# Patient Record
Sex: Female | Born: 1980 | Race: White | Hispanic: No | Marital: Single | Smoking: Current every day smoker
Health system: Southern US, Community
[De-identification: ages and names within clinical notes are randomized; demographics above are authoritative.]

## PROBLEM LIST (undated history)

## (undated) ENCOUNTER — Inpatient Hospital Stay (HOSPITAL_COMMUNITY): Payer: Self-pay

## (undated) ENCOUNTER — Emergency Department (HOSPITAL_BASED_OUTPATIENT_CLINIC_OR_DEPARTMENT_OTHER)

## (undated) DIAGNOSIS — F32A Depression, unspecified: Secondary | ICD-10-CM

## (undated) DIAGNOSIS — Q351 Cleft hard palate: Secondary | ICD-10-CM

## (undated) DIAGNOSIS — IMO0002 Reserved for concepts with insufficient information to code with codable children: Secondary | ICD-10-CM

## (undated) DIAGNOSIS — N83209 Unspecified ovarian cyst, unspecified side: Secondary | ICD-10-CM

## (undated) DIAGNOSIS — R51 Headache: Secondary | ICD-10-CM

## (undated) DIAGNOSIS — Z9889 Other specified postprocedural states: Secondary | ICD-10-CM

## (undated) DIAGNOSIS — R87619 Unspecified abnormal cytological findings in specimens from cervix uteri: Secondary | ICD-10-CM

## (undated) DIAGNOSIS — F419 Anxiety disorder, unspecified: Secondary | ICD-10-CM

## (undated) DIAGNOSIS — G510 Bell's palsy: Secondary | ICD-10-CM

## (undated) DIAGNOSIS — R112 Nausea with vomiting, unspecified: Secondary | ICD-10-CM

## (undated) DIAGNOSIS — M797 Fibromyalgia: Secondary | ICD-10-CM

## (undated) DIAGNOSIS — F329 Major depressive disorder, single episode, unspecified: Secondary | ICD-10-CM

## (undated) HISTORY — PX: TYMPANOSTOMY TUBE PLACEMENT: SHX32

## (undated) HISTORY — PX: NO PAST SURGERIES: SHX2092

## (undated) HISTORY — PX: UPPER GASTROINTESTINAL ENDOSCOPY: SHX188

## (undated) HISTORY — PX: COSMETIC SURGERY: SHX468

## (undated) HISTORY — PX: SALPINGECTOMY: SHX328

## (undated) HISTORY — PX: LAPAROSCOPY: SHX197

## (undated) HISTORY — PX: ABDOMINAL HYSTERECTOMY: SHX81

## (undated) HISTORY — PX: ABDOMINAL SURGERY: SHX537

---
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
## 1999-06-22 ENCOUNTER — Ambulatory Visit (HOSPITAL_COMMUNITY): Admission: RE | Admit: 1999-06-22 | Discharge: 1999-06-22 | Payer: Self-pay | Admitting: Family Medicine

## 1999-06-22 ENCOUNTER — Encounter: Payer: Self-pay | Admitting: Family Medicine

## 1999-07-05 ENCOUNTER — Emergency Department (HOSPITAL_COMMUNITY): Admission: EM | Admit: 1999-07-05 | Discharge: 1999-07-05 | Payer: Self-pay | Admitting: *Deleted

## 2000-09-04 ENCOUNTER — Inpatient Hospital Stay (HOSPITAL_COMMUNITY): Admission: AD | Admit: 2000-09-04 | Discharge: 2000-09-04 | Payer: Self-pay | Admitting: Obstetrics and Gynecology

## 2000-10-06 ENCOUNTER — Encounter (INDEPENDENT_AMBULATORY_CARE_PROVIDER_SITE_OTHER): Payer: Self-pay | Admitting: Specialist

## 2000-10-06 ENCOUNTER — Other Ambulatory Visit: Admission: RE | Admit: 2000-10-06 | Discharge: 2000-10-06 | Payer: Self-pay | Admitting: Obstetrics and Gynecology

## 2000-10-06 ENCOUNTER — Inpatient Hospital Stay (HOSPITAL_COMMUNITY): Admission: AD | Admit: 2000-10-06 | Discharge: 2000-10-06 | Payer: Self-pay | Admitting: Obstetrics and Gynecology

## 2000-12-08 ENCOUNTER — Encounter: Payer: Self-pay | Admitting: Emergency Medicine

## 2000-12-08 ENCOUNTER — Emergency Department (HOSPITAL_COMMUNITY): Admission: EM | Admit: 2000-12-08 | Discharge: 2000-12-08 | Payer: Self-pay | Admitting: Emergency Medicine

## 2000-12-09 ENCOUNTER — Ambulatory Visit (HOSPITAL_COMMUNITY): Admission: RE | Admit: 2000-12-09 | Discharge: 2000-12-09 | Payer: Self-pay | Admitting: Family Medicine

## 2000-12-09 ENCOUNTER — Encounter: Payer: Self-pay | Admitting: Family Medicine

## 2000-12-25 ENCOUNTER — Emergency Department (HOSPITAL_COMMUNITY): Admission: EM | Admit: 2000-12-25 | Discharge: 2000-12-25 | Payer: Self-pay | Admitting: Emergency Medicine

## 2000-12-26 ENCOUNTER — Encounter: Payer: Self-pay | Admitting: Emergency Medicine

## 2004-09-11 ENCOUNTER — Emergency Department (HOSPITAL_COMMUNITY): Admission: EM | Admit: 2004-09-11 | Discharge: 2004-09-11 | Payer: Self-pay

## 2005-06-07 ENCOUNTER — Emergency Department (HOSPITAL_COMMUNITY): Admission: EM | Admit: 2005-06-07 | Discharge: 2005-06-07 | Payer: Self-pay | Admitting: Emergency Medicine

## 2005-06-22 ENCOUNTER — Emergency Department (HOSPITAL_COMMUNITY): Admission: EM | Admit: 2005-06-22 | Discharge: 2005-06-22 | Payer: Self-pay | Admitting: Emergency Medicine

## 2006-07-27 ENCOUNTER — Emergency Department (HOSPITAL_COMMUNITY): Admission: EM | Admit: 2006-07-27 | Discharge: 2006-07-27 | Payer: Self-pay | Admitting: Emergency Medicine

## 2007-08-15 ENCOUNTER — Emergency Department (HOSPITAL_COMMUNITY): Admission: EM | Admit: 2007-08-15 | Discharge: 2007-08-15 | Payer: Self-pay | Admitting: Emergency Medicine

## 2007-09-24 ENCOUNTER — Emergency Department (HOSPITAL_COMMUNITY): Admission: EM | Admit: 2007-09-24 | Discharge: 2007-09-24 | Payer: Self-pay | Admitting: Emergency Medicine

## 2008-03-04 ENCOUNTER — Emergency Department (HOSPITAL_COMMUNITY): Admission: EM | Admit: 2008-03-04 | Discharge: 2008-03-04 | Payer: Self-pay | Admitting: Emergency Medicine

## 2008-04-25 ENCOUNTER — Emergency Department (HOSPITAL_COMMUNITY): Admission: EM | Admit: 2008-04-25 | Discharge: 2008-04-26 | Payer: Self-pay | Admitting: Emergency Medicine

## 2008-05-02 ENCOUNTER — Ambulatory Visit (HOSPITAL_COMMUNITY): Admission: RE | Admit: 2008-05-02 | Discharge: 2008-05-02 | Payer: Self-pay | Admitting: Obstetrics and Gynecology

## 2008-07-12 ENCOUNTER — Inpatient Hospital Stay (HOSPITAL_COMMUNITY): Admission: EM | Admit: 2008-07-12 | Discharge: 2008-07-17 | Payer: Self-pay | Admitting: Emergency Medicine

## 2008-07-15 ENCOUNTER — Encounter (INDEPENDENT_AMBULATORY_CARE_PROVIDER_SITE_OTHER): Payer: Self-pay | Admitting: Internal Medicine

## 2008-08-29 DIAGNOSIS — N179 Acute kidney failure, unspecified: Secondary | ICD-10-CM

## 2008-08-30 ENCOUNTER — Ambulatory Visit: Payer: Self-pay | Admitting: Gastroenterology

## 2008-08-30 DIAGNOSIS — R51 Headache: Secondary | ICD-10-CM

## 2008-08-30 DIAGNOSIS — R109 Unspecified abdominal pain: Secondary | ICD-10-CM | POA: Insufficient documentation

## 2008-08-30 DIAGNOSIS — R110 Nausea: Secondary | ICD-10-CM

## 2008-09-04 ENCOUNTER — Ambulatory Visit: Payer: Self-pay | Admitting: Cardiology

## 2008-09-24 ENCOUNTER — Ambulatory Visit: Payer: Self-pay | Admitting: Gastroenterology

## 2008-09-25 ENCOUNTER — Encounter: Payer: Self-pay | Admitting: Gastroenterology

## 2008-09-30 ENCOUNTER — Ambulatory Visit: Payer: Self-pay | Admitting: Gastroenterology

## 2008-09-30 LAB — CONVERTED CEMR LAB
ALT: 10 units/L (ref 0–35)
Albumin: 4.3 g/dL (ref 3.5–5.2)
Alkaline Phosphatase: 77 units/L (ref 39–117)
BUN: 16 mg/dL (ref 6–23)
Basophils Absolute: 0 10*3/uL (ref 0.0–0.1)
CO2: 29 meq/L (ref 19–32)
Chloride: 102 meq/L (ref 96–112)
GFR calc Af Amer: 128 mL/min
HCT: 43.7 % (ref 36.0–46.0)
Lymphocytes Relative: 30.3 % (ref 12.0–46.0)
MCHC: 35.1 g/dL (ref 30.0–36.0)
Neutro Abs: 6.5 10*3/uL (ref 1.4–7.7)
Platelets: 211 10*3/uL (ref 150–400)
RBC: 4.53 M/uL (ref 3.87–5.11)
RDW: 11.8 % (ref 11.5–14.6)
Sed Rate: 6 mm/hr (ref 0–22)
Sodium: 138 meq/L (ref 135–145)
Tissue Transglutaminase Ab, IgA: 0.4 units (ref <7)
Total Protein: 7.1 g/dL (ref 6.0–8.3)
WBC: 10.8 10*3/uL — ABNORMAL HIGH (ref 4.5–10.5)

## 2008-10-01 ENCOUNTER — Telehealth: Payer: Self-pay | Admitting: Gastroenterology

## 2008-10-02 ENCOUNTER — Ambulatory Visit: Payer: Self-pay | Admitting: Cardiology

## 2008-11-12 ENCOUNTER — Emergency Department (HOSPITAL_COMMUNITY): Admission: EM | Admit: 2008-11-12 | Discharge: 2008-11-13 | Payer: Self-pay | Admitting: Emergency Medicine

## 2009-01-08 ENCOUNTER — Emergency Department: Payer: Self-pay | Admitting: Emergency Medicine

## 2009-04-10 ENCOUNTER — Emergency Department: Payer: Self-pay | Admitting: Emergency Medicine

## 2009-04-24 ENCOUNTER — Ambulatory Visit (HOSPITAL_COMMUNITY): Admission: RE | Admit: 2009-04-24 | Discharge: 2009-04-24 | Payer: Self-pay | Admitting: Obstetrics and Gynecology

## 2009-04-24 ENCOUNTER — Ambulatory Visit: Payer: Self-pay | Admitting: Vascular Surgery

## 2009-04-24 ENCOUNTER — Encounter (INDEPENDENT_AMBULATORY_CARE_PROVIDER_SITE_OTHER): Payer: Self-pay | Admitting: Obstetrics and Gynecology

## 2009-05-25 ENCOUNTER — Inpatient Hospital Stay (HOSPITAL_COMMUNITY): Admission: AD | Admit: 2009-05-25 | Discharge: 2009-05-25 | Payer: Self-pay | Admitting: Obstetrics and Gynecology

## 2009-07-16 ENCOUNTER — Inpatient Hospital Stay (HOSPITAL_COMMUNITY): Admission: AD | Admit: 2009-07-16 | Discharge: 2009-07-16 | Payer: Self-pay | Admitting: Obstetrics and Gynecology

## 2009-08-19 ENCOUNTER — Inpatient Hospital Stay (HOSPITAL_COMMUNITY): Admission: AD | Admit: 2009-08-19 | Discharge: 2009-08-20 | Payer: Self-pay | Admitting: Obstetrics and Gynecology

## 2009-08-27 ENCOUNTER — Inpatient Hospital Stay (HOSPITAL_COMMUNITY): Admission: AD | Admit: 2009-08-27 | Discharge: 2009-08-27 | Payer: Self-pay | Admitting: Obstetrics and Gynecology

## 2009-09-02 ENCOUNTER — Inpatient Hospital Stay (HOSPITAL_COMMUNITY): Admission: AD | Admit: 2009-09-02 | Discharge: 2009-09-02 | Payer: Self-pay | Admitting: Obstetrics and Gynecology

## 2009-09-05 ENCOUNTER — Ambulatory Visit: Payer: Self-pay | Admitting: Advanced Practice Midwife

## 2009-09-05 ENCOUNTER — Inpatient Hospital Stay (HOSPITAL_COMMUNITY): Admission: AD | Admit: 2009-09-05 | Discharge: 2009-09-05 | Payer: Self-pay | Admitting: Obstetrics and Gynecology

## 2009-09-14 ENCOUNTER — Inpatient Hospital Stay (HOSPITAL_COMMUNITY): Admission: AD | Admit: 2009-09-14 | Discharge: 2009-09-14 | Payer: Self-pay | Admitting: Obstetrics and Gynecology

## 2009-09-16 ENCOUNTER — Inpatient Hospital Stay (HOSPITAL_COMMUNITY): Admission: AD | Admit: 2009-09-16 | Discharge: 2009-09-18 | Payer: Self-pay | Admitting: Obstetrics and Gynecology

## 2010-05-02 ENCOUNTER — Emergency Department (HOSPITAL_COMMUNITY): Admission: EM | Admit: 2010-05-02 | Discharge: 2010-05-02 | Payer: Self-pay | Admitting: Family Medicine

## 2010-06-07 ENCOUNTER — Emergency Department: Payer: Self-pay | Admitting: Emergency Medicine

## 2010-09-02 ENCOUNTER — Emergency Department (HOSPITAL_COMMUNITY)
Admission: EM | Admit: 2010-09-02 | Discharge: 2010-09-02 | Payer: Self-pay | Source: Home / Self Care | Admitting: Family Medicine

## 2010-10-10 ENCOUNTER — Emergency Department (HOSPITAL_COMMUNITY)
Admission: EM | Admit: 2010-10-10 | Discharge: 2010-10-10 | Payer: Self-pay | Source: Home / Self Care | Admitting: Emergency Medicine

## 2010-10-13 LAB — GC/CHLAMYDIA PROBE AMP, GENITAL
Chlamydia, DNA Probe: NEGATIVE
GC Probe Amp, Genital: NEGATIVE

## 2010-10-13 LAB — CBC
Hemoglobin: 15.5 g/dL — ABNORMAL HIGH (ref 12.0–15.0)
MCH: 32.7 pg (ref 26.0–34.0)
MCV: 94.1 fL (ref 78.0–100.0)
Platelets: 262 10*3/uL (ref 150–400)
RDW: 12.4 % (ref 11.5–15.5)

## 2010-10-13 LAB — URINALYSIS, ROUTINE W REFLEX MICROSCOPIC
Bilirubin Urine: NEGATIVE
Ketones, ur: NEGATIVE mg/dL
Nitrite: NEGATIVE
Protein, ur: NEGATIVE mg/dL
Urobilinogen, UA: 0.2 mg/dL (ref 0.0–1.0)

## 2010-10-13 LAB — BASIC METABOLIC PANEL
BUN: 15 mg/dL (ref 6–23)
GFR calc Af Amer: >60 mL/min (ref >60)
Potassium: 4.6 mEq/L (ref 3.5–5.1)

## 2010-10-13 LAB — WET PREP, GENITAL: Clue Cells Wet Prep HPF POC: NONE SEEN

## 2010-12-21 LAB — CBC
HCT: 37.1 % (ref 36.0–46.0)
Hemoglobin: 14.8 g/dL (ref 12.0–15.0)
MCHC: 34.2 g/dL (ref 30.0–36.0)
MCV: 98.2 fL (ref 78.0–100.0)
MCV: 99.3 fL (ref 78.0–100.0)
Platelets: 169 10*3/uL (ref 150–400)
RDW: 12.6 % (ref 11.5–15.5)
RDW: 12.9 % (ref 11.5–15.5)
WBC: 14.5 10*3/uL — ABNORMAL HIGH (ref 4.0–10.5)

## 2010-12-22 LAB — URINALYSIS, ROUTINE W REFLEX MICROSCOPIC
Bilirubin Urine: NEGATIVE
Ketones, ur: NEGATIVE mg/dL
Nitrite: NEGATIVE
Nitrite: NEGATIVE
Protein, ur: NEGATIVE mg/dL
Protein, ur: NEGATIVE mg/dL
Specific Gravity, Urine: <1.005 — ABNORMAL LOW (ref 1.005–1.030)
Urobilinogen, UA: 0.2 mg/dL (ref 0.0–1.0)
Urobilinogen, UA: 0.2 mg/dL (ref 0.0–1.0)
pH: 6.5 (ref 5.0–8.0)

## 2010-12-22 LAB — URINE MICROSCOPIC-ADD ON

## 2010-12-22 LAB — URINE CULTURE: Colony Count: NO GROWTH

## 2010-12-23 LAB — URINALYSIS, ROUTINE W REFLEX MICROSCOPIC
Glucose, UA: NEGATIVE mg/dL
Hgb urine dipstick: NEGATIVE
Ketones, ur: NEGATIVE mg/dL
Protein, ur: NEGATIVE mg/dL
pH: 7 (ref 5.0–8.0)

## 2010-12-23 LAB — URINE CULTURE: Colony Count: NO GROWTH

## 2010-12-23 LAB — FETAL FIBRONECTIN: Fetal Fibronectin: NEGATIVE

## 2010-12-23 LAB — URINE MICROSCOPIC-ADD ON: RBC / HPF: NONE SEEN RBC/hpf (ref <3)

## 2010-12-24 LAB — URINE CULTURE: Colony Count: 6000

## 2010-12-24 LAB — URINALYSIS, ROUTINE W REFLEX MICROSCOPIC
Bilirubin Urine: NEGATIVE
Ketones, ur: NEGATIVE mg/dL
Nitrite: NEGATIVE
Urobilinogen, UA: 0.2 mg/dL (ref 0.0–1.0)

## 2010-12-24 LAB — WET PREP, GENITAL: Trich, Wet Prep: NONE SEEN

## 2010-12-24 LAB — FETAL FIBRONECTIN: Fetal Fibronectin: NEGATIVE

## 2010-12-25 LAB — URINALYSIS, ROUTINE W REFLEX MICROSCOPIC
Bilirubin Urine: NEGATIVE
Ketones, ur: NEGATIVE mg/dL
Nitrite: NEGATIVE
Protein, ur: NEGATIVE mg/dL
Urobilinogen, UA: 0.2 mg/dL (ref 0.0–1.0)

## 2010-12-25 LAB — URINE MICROSCOPIC-ADD ON

## 2011-01-05 LAB — URINALYSIS, ROUTINE W REFLEX MICROSCOPIC
Glucose, UA: NEGATIVE mg/dL
Leukocytes, UA: NEGATIVE
Protein, ur: NEGATIVE mg/dL
Specific Gravity, Urine: 1.025 (ref 1.005–1.030)

## 2011-01-05 LAB — DIFFERENTIAL
Eosinophils Absolute: 0.4 10*3/uL (ref 0.0–0.7)
Eosinophils Relative: 3 % (ref 0–5)
Lymphs Abs: 3.7 10*3/uL (ref 0.7–4.0)
Monocytes Relative: 6 % (ref 3–12)
Neutrophils Relative %: 62 % (ref 43–77)

## 2011-01-05 LAB — CBC
HCT: 43.1 % (ref 36.0–46.0)
MCV: 95.1 fL (ref 78.0–100.0)
RBC: 4.53 MIL/uL (ref 3.87–5.11)
WBC: 13 10*3/uL — ABNORMAL HIGH (ref 4.0–10.5)

## 2011-01-05 LAB — BASIC METABOLIC PANEL
CO2: 28 mEq/L (ref 19–32)
Calcium: 9.6 mg/dL (ref 8.4–10.5)
Creatinine, Ser: 0.77 mg/dL (ref 0.4–1.2)
GFR calc Af Amer: >60 mL/min (ref >60)
Glucose, Bld: 88 mg/dL (ref 70–99)

## 2011-01-05 LAB — URINE MICROSCOPIC-ADD ON

## 2011-01-25 ENCOUNTER — Emergency Department (HOSPITAL_COMMUNITY): Admission: EM | Admit: 2011-01-25 | Payer: Self-pay | Source: Home / Self Care

## 2011-02-02 NOTE — Consult Note (Signed)
[name], COFFIE NO.:  [id number]   MEDICAL RECORD NO.:  [id number]          PATIENT TYPE:  INP   LOCATION:  1332                         FACILITY:  [facility]   PHYSICIAN:  Dineen Kid. Rana Snare, M.D.    DATE OF BIRTH:  Jul 01, 1981   DATE OF CONSULTATION:  07/12/2008  DATE OF DISCHARGE:                                 CONSULTATION   HISTORY OF PRESENT ILLNESS:  [name] is a 30 year old white female  well-known to me with a history of chronic pelvic pain.  She was last  seen by me in August when she was having some mild right lower quadrant  discomfort with normal GYN evaluation other than she did have a CAT scan  at that time, which showed a mild dilation of the right fallopian tube.  Previously, she had surgery in June for the left lower quadrant pain, at  which time she did have some small adhesions along the left tubo-ovarian  complex but had a normal-appearing right fallopian tube and ovary.  Uterus was with no evidence of endometriosis at that time and normal-  appearing appendix.  She recovered nicely from the surgery and was not  having problems until the end of the summer other than she did have a  mildly increased white count around the time of the surgery but had a  normal white count in August.   In September, she apparently went to [facility] for facial surgery for  history of cleft lip and palate and apparently did well with the surgery  but subsequently ended up having a cellulitis and had to return,  requiring antibiotics.  She returns with abdominal pain, nausea and  vomiting, and presented to the emergency room last night, where she was  found to have elevated creatinine and suspicions for acute renal  failure.  Has been admitted to the medicine team for evaluation and  treatment of this.  She apparently was on vancomycin and Zosyn but  apparently they did not have a culture back at [facility].   Mother reports that she has had off-and-on nausea  before the surgery  unrelated to pain.  This actually has gotten worse recently.   PHYSICAL EXAMINATION:  She is afebrile.  GENERAL:  She is in mild-to-moderate distress upon examination due to  the nausea.  The pain appears to be controlled unless deep palpation to  the abdomen.  HEART:  Regular rate and rhythm.  LUNGS:  Clear to auscultation bilaterally.  ABDOMEN:  Soft.  She has normoactive bowel sounds.  No organomegaly is  palpable.  She does have generalized abdominal tenderness with the  greatest predilection in the right lower quadrant.  There is no rebound.  There is some mild guarding to deep palpation.  The pain also is along  the right flank.  PELVIC:  Deferred per patient's request.  Apparently, last night the ER  doctor did a pelvic exam.  She said the pelvic part did not hurt, but  the abdominal hand did.   I reviewed her laboratory evaluations today, which showed a negative  urinalysis, negative cultures,  negative pregnancy test.  Her initial  white count was 19.8.  Her white count this morning is 15.6.   CT of the abdomen and pelvis shows no acute abnormality of the abdomen.  The pelvis shows a tubal structure in the right adnexal area, which was  seen in August, but is more prominent today.  A cystic lesion of the  right ovary, which measured 3.6 cm in size.   Renal ultrasound today shows nonspecific renal medical disease.   IMPRESSION/PLAN:  1. Acute renal failure:  Recommend she continue to be followed by the      internal medicine team at [facility], as they currently are.  2. Nausea, vomiting, and diarrhea.  This could either be caused by or      related to problem #1.  Also, because of the antibiotics,      especially the strength of the antibiotics and the prolonged      course, this could also be related to Clostridium difficile, and I      do agree with the workup for evaluation and treatment of this.      This could be the cause of the abdominal  discomfort as well.  3. Gynecological abnormalities and a history of chronic pain:      Comparing the CT scans, there is not a substantial difference      between the two, the one from today and the one from two months      ago, especially with the amount of discomfort that she is showing,      certainly this would not cause any renal failure.   At this time, I would recommend that you continue with the aggressive  management of the renal failure and other causes of the diarrhea and  nausea and vomiting.  If you feel like this becomes more gynecologic in  nature, would recommend a pelvic ultrasound for evaluation of the adnexa  and pelvis.  If there are suspicions for purulence or certainly if the  right ovarian cyst represents anything other than a normal functional  cyst, another consideration would be fluoroscopic drainage or culture of  that.   Finally, if necessary, I could proceed laparoscopically for evaluation  of the right tubal structure; however, I feel like this would only be a  last resort and do not really think it would be very high yields.  I  have operated on her four months ago and had very normal findings at  that time.   Please contact me for further GYN needs or questions.      Dineen Kid Rana Snare, M.D.  Electronically Signed     DCL/MEDQ  D:  07/12/2008  T:  07/13/2008  Job:  [id number]

## 2011-02-02 NOTE — Discharge Summary (Signed)
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  Presented to the [facility] ER because patient was having  intractable nausea and vomiting with right lower quadrant pain and  diarrhea.  On admission, the patient had a CAT scan of the abdomen and  pelvis which revealed a tubular structure involving the right adnexa.  This was seen on the previous CT scan but is more prominent today.  There was associated free fluid, which is worrisome for hydrosalpinx or  potentially myosalpinx.  She has a cystic lesion of the right ovary.  No  acute abnormalities of the abdomen.   Chest x-ray revealed chronic bronchitic changes.  No acute infiltrate.   In addition, patient was found to have a creatinine of 4.17 with no  previous history of renal failure.  Patient was started on IV fluids  with aggressive hydration.  Her vancomycin and Zosyn were discontinued,  suspecting myosalpinx.  Empiric antibiotics with ceftriaxone and Flagyl  was started.  OB/GYN consult was obtained with Dr. Rana Snare, who is well  known to her.   Dr. Rana Snare felt that the patient may be having other causes for her  diarrhea, nausea and vomiting and recommended a pelvic ultrasound for  evaluation from the adnexa and pelvis and to treat her acute renal  failure.  Schedule patient for pyosalpinx, fluoroscopic-guided drainage  of the culture of that fluid was recommended.   A pelvic sonogram was done which showed  this large, simple-appearing  right ovarian cyst, amenable to followup in 4-6 weeks.   Patient also had blood cultures, urine cultures, and C. diff, all of  which were negative.  Nephrology was consulted.  Sonogram of the kidneys  were done, which did not show any obstruction or  features compatible  with nonspecific renal medical disease.  Patient had nonoliguric renal  failure.  Her chart received from [facility] showed she had a  creatinine of 0.8 on July 08, 2008.  Per nephrology, it was felt that  the patient had acute renal failure, probably secondary to vancomycin  toxicity, and felt that her creatinine would take more time to  normalize.  At this time, as the patient has not had any features of  tubular or renal abscess on the pelvic ultrasound, antibiotics were  discontinued.   Patient has shown improvement in nausea and vomiting.  Is able to  tolerate a diet at this time.  Nephrologist has advised that the patient  will need twice-weekly check of basic metabolic panel.  This also has to  be  faxed to the nephrologist at [phone].  I did discuss with Dr. Demaris Callander, the surgeon at [facility], who said he felt patient may have  had some cellulitis of the right side of the face, which is why the  antibiotics were started.  Once the patient is discharged, he is going  to see the patient on Thursday, July 18, 2008.  Once the arrangements  are being made for blood withdrawal, patient will be discharged home  with p.r.n. Phenergan.   PROCEDURES DURING THIS STAY:  1. CT of the abdomen and pelvis shows tubular structure involving the      right adnexa.  This was seen on the previous CT scan, but it is      more prominent today and is associated with free fluid, worrisome      for hydrosalpinx or potentially myosalpinx.  Cystic lesion of the      right ovary.  It is not clear if this is actually in the right      ovary or associated with other enlarged tubular  structure, again      tubo-ovarian abscess is of concern.  2. Sonogram of the kidneys done on July 12, 2008 shows findings      compatible with nonspecific renal medical disease.  3. Pelvic ultrasound showed a large simple-appearing right ovarian      cyst, amenable to followup in 4-6 weeks.   FINAL DIAGNOSES:  1. Acute renal failure, probably secondary to vancomycin toxicity.  2. Intractable nausea and vomiting, etiology not clear.  3. History of recent right-sided facial reconstructive surgery with      possible cellulitis.  4. Ovarian cyst, right-sided.   PLAN:  Once patient's lab drawing of basic metabolic panel twice-a-week  has been arranged and copies, which are to be faxed to nephrology at [phone], has been arranged.  If patient still has no nausea, vomiting,  and is able to tolerate a diet, will be discharged home to follow up  with her primary care physician.  Patient is to follow with a  nephrologist as scheduled.  Patient is to follow up with Dr. Demaris Callander,  her surgeon at [facility], on Thursday, July 18, 2008.  Patient  can be on a regular diet.   DISCHARGE MEDICATIONS:  Dictated by the discharging MD at the time of  the discharge.      Eduard Clos, MD  Electronically Signed     ANK/MEDQ  D:  07/16/2008  T:  07/16/2008  Job:  [id number]

## 2011-02-02 NOTE — Discharge Summary (Signed)
NAMELIN, HACKMANN NO.:  [id number]   MEDICAL RECORD NO.:  [id number]          PATIENT TYPE:  INP   LOCATION:  1332                         FACILITY[facility]  [facility]   PHYSICIAN:  Altha Harm, MDDATE OF BIRTH:  [birth year]   DATE OF ADMISSION:  07/11/2008  DATE OF DISCHARGE:  07/17/2008                               DISCHARGE SUMMARY   DISCHARGE DISPOSITION:  Home.   FINAL DISCHARGE DIAGNOSES:  Please refer to discharge summary done by  Dr. Toniann Fail on July 16, 2008, for details of the discharge  diagnoses.   DISCHARGE MEDICATIONS:  1. Xanax 1 mg p.o. b.i.d.  2. Phenergan 12.5 mg p.o. q.8 h p.r.n.   FOLLOWUP:  The patient is to follow up with Dr. Eliott Nine in the nephrology  office.  The patient is to have her lab results done twice a week on  Monday and Thursday, first set of lab results being done on July 22, 2008.  The number has been given for the patient to call Dr. Eliott Nine to  arrange the appointment.  The patient is also to follow up with her  primary care physicians at [facility] as needed.   The PICC line is being removed from the patient as there is no infusion  going to the PICC line.  The patient states that she is difficult stick  for blood draws and I advised the patient that she could have EMLA used  as a topical analgesic prior to having blood draws.   PERTINENT LABORATORY STUDIES:  At the time of discharge the patient had  a sodium of 143, potassium 3.7, chloride 113, bicarb 24, BUN 19,  creatinine 3.67.      Altha Harm, MD  Electronically Signed     MAM/MEDQ  D:  07/17/2008  T:  07/17/2008  Job:  [id number]

## 2011-02-02 NOTE — H&P (Signed)
Pamela Bailey, Pamela Bailey NO.:  [id number]   MEDICAL RECORD NO.:  [id number]          PATIENT TYPE:  INP   LOCATION:  0103                         FACILITY:  [facility]   PHYSICIAN:  Thomasenia Bottoms, MDDATE OF BIRTH:  May 26, 1981   DATE OF ADMISSION:  07/11/2008  DATE OF DISCHARGE:                              HISTORY & PHYSICAL   CHIEF COMPLAINT:  Abdominal pain.   HISTORY OF PRESENT ILLNESS:  Pamela Bailey is a 30 year old who presents  with right lower quadrant abdominal pain, nausea, vomiting and diarrhea.  The patient had facial surgery last month as part of a series of  surgeries to repair a clef lip and palate.  This was complicated by an  infection of the face.  She was discharged charge from [facility]  approximately 7 days ago on IV vancomycin and Zosyn after a 5-day  hospital stay.  She says she felt fine at the time of discharge, but she  started to have abdominal pains again.  She has had trouble with  abdominal pains intermittently for at least 6 months, likely slightly  longer, but she also started having trouble with nausea, vomiting and  diarrhea which was new for her.   PAST MEDICAL HISTORY:  Significant for multiple surgeries to repair a  cleft lip and palate with the most recent being in September 2009.  This  one has been complicated by infection as mentioned above.  She has had  trouble with this abdominal pain in the past and actually had what  sounds like a laparoscopic surgery by her OB doctor in May 2009, looking  for the cause.  The significant other tells me that there was some  concern of cancer, but none was found.  The only thing that was found  was essentially some scar tissue.  Based on a CT scan in August 2009,  the patient had what appeared to be a possible right hydrosalpinx.   MEDICATIONS ON ARRIVAL:  1. Vancomycin IV twice daily.  2. Zosyn 3.375 mg four times daily.  3. P.r.n. Phenergan.  4. Ammonium.  5. The patient also  was on Chantix, which she took her last dose of on      Friday when she was in the hospital at [facility].   FAMILY HISTORY:  Significant for no coronary artery disease.   SOCIAL HISTORY:  She does smoke cigarettes.  No alcohol or illicit drug  use.   REVIEW OF SYSTEMS:  CONSTITUTIONAL:  Her appetite was okay prior to this  trouble as mentioned above.  No night sweats.  HEENT:  No headache.  She had some facial pain because of her recent  surgery.  At baseline, she does not have movement in the right side of  her face, but she does have feeling.  CARDIOVASCULAR:  Generally no chest pains.  No lower extremity edema.  RESPIRATORY:  No shortness of breath.  GI:  She has this abdominal pain, but did not have diarrhea, nausea or  vomiting and had not seen any bright red blood per rectum or vomiting  blood.  All other systems  reviewed and are negative.   PHYSICAL EXAMINATION:  VITAL SIGNS:  In the emergency department, her  temperature was 98.6, blood pressure 126/71, pulse initially 103,  respiratory rate 20.  O2 sats 100% on room air.  GENERAL:  The patient is slightly ill-appearing, no acute distress.  HEENT:  Normocephalic, atraumatic.  Her pupils are equal and round.  Her  sclerae are nonicteric.  Oral mucosa slightly dry.  She has mild cleft  lip deformity.  Her right face seems mildly swollen, but is not  discolored, is not hot to touch.  There is no erythema.  NECK:  Supple.  No lymphadenopathy, no thyromegaly, no jugulovenous  distention.  CARDIAC:  Regular rate and rhythm.  No murmurs, no gallops, no rubs.  LUNGS:  Clear to auscultation bilaterally.  No wheezes, no rhonchi, no  rales.  ABDOMEN:  Soft.  She does have bowel sounds.  No hepatosplenomegaly, but  she does have some tenderness in the right lower quadrant.  EXTREMITIES:  Reveal no evidence of clubbing, cyanosis or pitting edema.  NEUROLOGICAL:  She is alert and oriented x3.  No slurred speech.  She  moves each of her  extremities with no evidence of focal neurologic  deficit.  She has a normal gait.  Sensory exam grossly is intact in her  face and in her extremities.  She does not have movement of the right  side of her face.  MUSCULOSKELETAL:  Reveals good range of motion.  No  effusions of her joints.   LABORATORY DATA:  Urine pregnancy negative.  Urinalysis unremarkable.  She has 0-2 WBCs, 0-2 RBCs.  Her white count is 19.8, hemoglobin 11.8,  hematocrit 34.4, platelet count is 217.  Sodium is 139, potassium 3.8,  chloride 101, bicarb 28, glucose 101, BUN 22, creatinine 4.17.  The  patient had a CT scan of her abdomen and pelvis which revealed a tubular  structure involving the right adnexa.  This was seen on prior CT scan,  but is more prominent today.  There is associated free fluid which is  worrisome for hydrosalpinx or potentially pyosalpinx.  She has a cystic  lesion in the right ovary.  No acute abnormalities of the abdomen.  She  also had a chest x-ray which revealed some chronic bronchitic changes  and hyperaeration, no acute infiltrate.   ASSESSMENT AND PLAN:  1. Acute renal failure.  We will put the patient on IV fluids.  The      cause of the renal failure is not completely known.  There is a      chance that this is multifactorial.  The antibiotics certainly      could have contributed.  Her dehydration also has likely      contributed.  We will hold her antibiotics and hold all nephrotoxic      agents.  We will put her on IV fluids and follow.  Certainly if she      does not have a quick improvement in her creatinine, would consult      nephrology.  2. Nausea and vomiting.  I suspect this is from the renal failure.  3. Diarrhea.  Because she has been on significant strong antibiotics,      will check her stool for C diff and hold any further Imodium at      this time.  4. Facial cellulitis, post recent surgery.  The patient has been on IV      antibiotics for what sounds like  12  days now.  So at this point, I      am going to hold her antibiotics because of the kidney function and      follow.  She does not have any clinical evidence of infection that      I can detect at this moment.  5. Abdominal pain.  This is an acute on chronic problem.  The CT scan      raises the question of possible pyosalpinx, so we certainly will      consult her gynecologist, Dr. Rana Snare and ask for recommendations in      this department.      Thomasenia Bottoms, MD  Electronically Signed     CVC/MEDQ  D:  07/12/2008  T:  07/12/2008  Job:  [id number]   cc:   Demaris Callander, MD  [facility] C. Rana Snare, M.D.  Fax: [phone]

## 2011-04-01 ENCOUNTER — Emergency Department (HOSPITAL_COMMUNITY)
Admission: EM | Admit: 2011-04-01 | Discharge: 2011-04-01 | Disposition: A | Discharge status: Home / Self Care | Payer: Medicaid Other | Attending: Emergency Medicine | Admitting: Emergency Medicine

## 2011-04-01 ENCOUNTER — Emergency Department (HOSPITAL_COMMUNITY): Payer: Medicaid Other

## 2011-04-01 DIAGNOSIS — M546 Pain in thoracic spine: Secondary | ICD-10-CM | POA: Insufficient documentation

## 2011-04-01 DIAGNOSIS — J9383 Other pneumothorax: Secondary | ICD-10-CM | POA: Insufficient documentation

## 2011-04-01 DIAGNOSIS — J45909 Unspecified asthma, uncomplicated: Secondary | ICD-10-CM | POA: Insufficient documentation

## 2011-04-01 LAB — DIFFERENTIAL
Basophils Absolute: 0 10*3/uL (ref 0.0–0.1)
Basophils Relative: 0 % (ref 0–1)
Eosinophils Absolute: 0.1 10*3/uL (ref 0.0–0.7)
Eosinophils Relative: 1 % (ref 0–5)
Lymphocytes Relative: 22 % (ref 12–46)
Lymphs Abs: 3.3 10*3/uL (ref 0.7–4.0)
Monocytes Absolute: 0.7 10*3/uL (ref 0.1–1.0)
Monocytes Relative: 5 % (ref 3–12)
Neutro Abs: 10.5 10*3/uL — ABNORMAL HIGH (ref 1.7–7.7)
Neutrophils Relative %: 72 % (ref 43–77)

## 2011-04-01 LAB — URINALYSIS, ROUTINE W REFLEX MICROSCOPIC
Glucose, UA: NEGATIVE mg/dL
Ketones, ur: 15 mg/dL — AB
Leukocytes, UA: NEGATIVE
pH: 6 (ref 5.0–8.0)

## 2011-04-01 LAB — POCT I-STAT, CHEM 8
BUN: 20 mg/dL (ref 6–23)
Calcium, Ion: 1.24 mmol/L (ref 1.12–1.32)
Chloride: 102 mEq/L (ref 96–112)
Potassium: 4.4 mEq/L (ref 3.5–5.1)

## 2011-04-01 LAB — CBC
HCT: 43.4 % (ref 36.0–46.0)
Hemoglobin: 15.7 g/dL — ABNORMAL HIGH (ref 12.0–15.0)
MCH: 33.1 pg (ref 26.0–34.0)
MCHC: 36.2 g/dL — ABNORMAL HIGH (ref 30.0–36.0)
MCV: 91.6 fL (ref 78.0–100.0)
Platelets: 236 10*3/uL (ref 150–400)
RBC: 4.74 MIL/uL (ref 3.87–5.11)
RDW: 12 % (ref 11.5–15.5)
WBC: 14.6 10*3/uL — ABNORMAL HIGH (ref 4.0–10.5)

## 2011-04-01 LAB — COMPREHENSIVE METABOLIC PANEL
ALT: 15 U/L (ref 0–35)
AST: 16 U/L (ref 0–37)
Albumin: 4.5 g/dL (ref 3.5–5.2)
Alkaline Phosphatase: 82 U/L (ref 39–117)
BUN: 18 mg/dL (ref 6–23)
CO2: 27 mEq/L (ref 19–32)
Calcium: 9.9 mg/dL (ref 8.4–10.5)
Chloride: 99 mEq/L (ref 96–112)
Creatinine, Ser: 0.65 mg/dL (ref 0.50–1.10)
GFR calc Af Amer: >60 mL/min (ref >60)
GFR calc non Af Amer: >60 mL/min (ref >60)
Glucose, Bld: 93 mg/dL (ref 70–99)
Potassium: 4 mEq/L (ref 3.5–5.1)
Sodium: 138 mEq/L (ref 135–145)
Total Bilirubin: 0.4 mg/dL (ref 0.3–1.2)
Total Protein: 7.3 g/dL (ref 6.0–8.3)

## 2011-04-06 ENCOUNTER — Other Ambulatory Visit: Payer: Self-pay | Admitting: Thoracic Surgery

## 2011-04-06 DIAGNOSIS — J930 Spontaneous tension pneumothorax: Secondary | ICD-10-CM

## 2011-04-07 ENCOUNTER — Ambulatory Visit
Admission: RE | Admit: 2011-04-07 | Discharge: 2011-04-07 | Disposition: A | Discharge status: Home / Self Care | Payer: Medicaid Other | Source: Ambulatory Visit | Attending: Thoracic Surgery | Admitting: Thoracic Surgery

## 2011-04-07 ENCOUNTER — Ambulatory Visit (INDEPENDENT_AMBULATORY_CARE_PROVIDER_SITE_OTHER): Payer: Self-pay | Admitting: Thoracic Surgery

## 2011-04-07 DIAGNOSIS — J930 Spontaneous tension pneumothorax: Secondary | ICD-10-CM

## 2011-04-07 NOTE — Letter (Signed)
April 07, 2011  Dr. Burnett Kanaris  Re:  Pamela Bailey, Pamela Bailey              DOB:  July 11, 1981  Dear Dr. Piedad Climes:  The patient was referred from the [facility] Emergency Room.  She was seen there approximately 4 days ago with right chest pain.  A chest x-ray at first, had a possible 5% pneumothorax, but a followup film did not show that.  She is a smoker.  She was referred to [place] for followup for possible pneumothorax.  Chest x-ray today showed hyperinflation of her lungs with no evidence of any pneumothorax.  She has had still some mild right chest pain over the anterior axillary line at the fifth intercostal space since then, were thus resolving.  She has also had a cough since then.  She is on no medications.  She is allergic to AMOXICILLIN, AUGMENTIN, VANCOMYCIN, and ZITHROMAX.  PAST MEDICAL HISTORY:  Apparently, she had some problems with renal dysfunction in the past.  FAMILY HISTORY:  Noncontributory.  SOCIAL HISTORY:  Single.  She smokes 1-pack a day.  Does not drink alcohol on a regular basis.  REVIEW OF SYSTEMS:  She is 90 pounds.  She is 5 feet 5 inches. GENERAL:  Her weight has been stable. CARDIAC:  She has some chest tightness.  No atrial fibrillation. PULMONARY:  She has got asthma.  No hemoptysis. GI:  Some occasional abdominal pain.  No diarrhea or constipation. GU:  See past medical history. VASCULAR:  No claudication, DVT, TIAs. NEUROLOGICAL:  No dizziness, headaches, blackouts, or seizures. MUSCULOSKELETAL:  No arthritis. PSYCHIATRIC:  No depression or nervous. EYES AND ENT:  No change in eyesight or hearing. HEMATOLOGICAL:  No problems with bleeding, clotting disorders, or anemia.  PHYSICAL EXAMINATION:  She is a well developed, is a thin Caucasian female in no acute distress.  Her blood pressure is 126/92, pulse 88, respirations 18, sats were 99%.  Head, eyes, ears, nose, and throat are unremarkable.  Neck is supple without thyromegaly.  Chest:  There  is increased AP diameter, but there is no wheezes.  Lungs are clear. Heart:  Regular sinus rhythm.  Abdomen:  Soft.  Extremities:  Pulses are 2+.  There is no clubbing or edema.  Neurological, she is oriented x3. Sensory and motor intact.  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  I talked to her about this and explained that there was no evidence of any pneumothorax, but that she was at high risk if she continued to smoke or develop early chronic obstructive pulmonary disease given her body habitus as well as her hyperinflation of her lungs.  We are happy to see her again if she develops any problems as far as pneumothorax.  Ines Bloomer, M.D. Electronically Signed  DPB/MEDQ  D:  04/07/2011  T:  04/07/2011  Job:  [id number]

## 2011-05-04 ENCOUNTER — Emergency Department: Payer: Self-pay | Admitting: *Deleted

## 2011-06-17 LAB — DIFFERENTIAL
Basophils Relative: 2 — ABNORMAL HIGH
Eosinophils Absolute: 0.3
Lymphs Abs: 2.1
Monocytes Relative: 3
Neutro Abs: 17.9 — ABNORMAL HIGH
Neutrophils Relative %: 84 — ABNORMAL HIGH

## 2011-06-17 LAB — URINALYSIS, ROUTINE W REFLEX MICROSCOPIC
Leukocytes, UA: NEGATIVE
Nitrite: NEGATIVE
Protein, ur: NEGATIVE
Specific Gravity, Urine: 1.015
Urobilinogen, UA: 0.2

## 2011-06-17 LAB — CBC
MCHC: 34.8
Platelets: 313
RBC: 4.48
WBC: 21.3 — ABNORMAL HIGH

## 2011-06-17 LAB — POCT I-STAT, CHEM 8
Chloride: 103
Glucose, Bld: 134 — ABNORMAL HIGH
HCT: 45
Hemoglobin: 15.3 — ABNORMAL HIGH
Potassium: 3.9
Sodium: 139

## 2011-06-17 LAB — URINE MICROSCOPIC-ADD ON

## 2011-06-18 LAB — URINALYSIS, ROUTINE W REFLEX MICROSCOPIC
Bilirubin Urine: NEGATIVE
Glucose, UA: NEGATIVE
Ketones, ur: NEGATIVE
Leukocytes, UA: NEGATIVE
Protein, ur: NEGATIVE
pH: 6.5

## 2011-06-18 LAB — POCT I-STAT, CHEM 8
BUN: 8
Calcium, Ion: 1.25
Chloride: 103
HCT: 51 — ABNORMAL HIGH
Potassium: 4.7
Sodium: 140

## 2011-06-18 LAB — DIFFERENTIAL
Basophils Absolute: 0.1
Lymphocytes Relative: 19
Monocytes Absolute: 0.7
Neutro Abs: 11.7 — ABNORMAL HIGH
Neutrophils Relative %: 75

## 2011-06-18 LAB — URINE MICROSCOPIC-ADD ON

## 2011-06-18 LAB — CBC
Hemoglobin: 16.3 — ABNORMAL HIGH
RDW: 12.9

## 2011-06-21 LAB — CBC
HCT: 32.1 — ABNORMAL LOW
Hemoglobin: 10.9 — ABNORMAL LOW
Hemoglobin: 11.8 — ABNORMAL LOW
MCHC: 33.9
MCHC: 33.9
MCHC: 34.3
MCHC: 34.4
MCHC: 35.3
MCV: 95.4
MCV: 96.1
MCV: 96.2
MCV: 98.9
Platelets: 143 — ABNORMAL LOW
Platelets: 144 — ABNORMAL LOW
Platelets: 147 — ABNORMAL LOW
RBC: 2.86 — ABNORMAL LOW
RBC: 3.05 — ABNORMAL LOW
RBC: 3.34 — ABNORMAL LOW
RBC: 3.58 — ABNORMAL LOW
RDW: 12.1
RDW: 12.1
RDW: 12.2
RDW: 12.6
WBC: 16.6 — ABNORMAL HIGH
WBC: 19.8 — ABNORMAL HIGH

## 2011-06-21 LAB — RENAL FUNCTION PANEL
CO2: 21
Calcium: 8.2 — ABNORMAL LOW
Creatinine, Ser: 4.06 — ABNORMAL HIGH
GFR calc Af Amer: 16 — ABNORMAL LOW
GFR calc non Af Amer: 13 — ABNORMAL LOW
Glucose, Bld: 99
Phosphorus: 4
Sodium: 142

## 2011-06-21 LAB — DIFFERENTIAL
Basophils Relative: 0
Lymphs Abs: 1.6
Monocytes Absolute: 1.6 — ABNORMAL HIGH
Monocytes Relative: 8
Neutro Abs: 16.4 — ABNORMAL HIGH
Neutrophils Relative %: 83 — ABNORMAL HIGH

## 2011-06-21 LAB — COMPREHENSIVE METABOLIC PANEL
ALT: 8
ALT: 9
AST: 12
AST: 13
Albumin: 2.5 — ABNORMAL LOW
Albumin: 2.9 — ABNORMAL LOW
Alkaline Phosphatase: 60
Alkaline Phosphatase: 61
BUN: 21
CO2: 23
Calcium: 8.2 — ABNORMAL LOW
Calcium: 8.4
Chloride: 107
Creatinine, Ser: 4.39 — ABNORMAL HIGH
GFR calc Af Amer: 14 — ABNORMAL LOW
GFR calc Af Amer: 16 — ABNORMAL LOW
GFR calc non Af Amer: 12 — ABNORMAL LOW
Glucose, Bld: 90
Potassium: 3.8
Potassium: 3.8
Sodium: 138
Sodium: 140
Total Bilirubin: 0.8
Total Protein: 5 — ABNORMAL LOW
Total Protein: 5.2 — ABNORMAL LOW

## 2011-06-21 LAB — BASIC METABOLIC PANEL
BUN: 19
BUN: 20
CO2: 21
CO2: 21
CO2: 28
Calcium: 8.1 — ABNORMAL LOW
Calcium: 8.3 — ABNORMAL LOW
Calcium: 9.1
Chloride: 101
Chloride: 112
Chloride: 112
Chloride: 113 — ABNORMAL HIGH
Creatinine, Ser: 3.67 — ABNORMAL HIGH
Creatinine, Ser: 4.17 — ABNORMAL HIGH
Creatinine, Ser: 4.28 — ABNORMAL HIGH
Creatinine, Ser: 4.48 — ABNORMAL HIGH
GFR calc Af Amer: 14 — ABNORMAL LOW
GFR calc Af Amer: 15 — ABNORMAL LOW
GFR calc Af Amer: 15 — ABNORMAL LOW
Glucose, Bld: 90
Glucose, Bld: 95
Glucose, Bld: 98
Potassium: 3.7
Sodium: 139

## 2011-06-21 LAB — URINE MICROSCOPIC-ADD ON

## 2011-06-21 LAB — CLOSTRIDIUM DIFFICILE EIA: C difficile Toxins A+B, EIA: NEGATIVE

## 2011-06-21 LAB — GC/CHLAMYDIA PROBE AMP, GENITAL: Chlamydia, DNA Probe: NEGATIVE

## 2011-06-21 LAB — URINALYSIS, ROUTINE W REFLEX MICROSCOPIC
Glucose, UA: NEGATIVE
Hgb urine dipstick: NEGATIVE
Leukocytes, UA: NEGATIVE
Nitrite: NEGATIVE
Specific Gravity, Urine: 1.007
Specific Gravity, Urine: 1.009
Urobilinogen, UA: 0.2
pH: 6.5

## 2011-06-21 LAB — WET PREP, GENITAL: Trich, Wet Prep: NONE SEEN

## 2011-06-21 LAB — SODIUM, URINE, RANDOM: Sodium, Ur: 26

## 2011-06-21 LAB — CULTURE, BLOOD (ROUTINE X 2)

## 2011-06-21 LAB — URINE CULTURE

## 2011-06-21 LAB — CREATININE, URINE, RANDOM: Creatinine, Urine: 35.5

## 2011-06-21 LAB — VANCOMYCIN, TROUGH: Vancomycin Tr: 30.2

## 2011-06-21 LAB — PREGNANCY, URINE: Preg Test, Ur: NEGATIVE

## 2011-06-21 LAB — RPR: RPR Ser Ql: NONREACTIVE

## 2011-07-08 ENCOUNTER — Emergency Department (HOSPITAL_COMMUNITY)
Admission: EM | Admit: 2011-07-08 | Discharge: 2011-07-09 | Disposition: A | Discharge status: Home / Self Care | Payer: Medicaid Other | Attending: Emergency Medicine | Admitting: Emergency Medicine

## 2011-07-08 DIAGNOSIS — J45909 Unspecified asthma, uncomplicated: Secondary | ICD-10-CM | POA: Insufficient documentation

## 2011-07-08 DIAGNOSIS — N949 Unspecified condition associated with female genital organs and menstrual cycle: Secondary | ICD-10-CM | POA: Insufficient documentation

## 2011-07-08 LAB — URINALYSIS, ROUTINE W REFLEX MICROSCOPIC
Bilirubin Urine: NEGATIVE
Glucose, UA: NEGATIVE mg/dL
Hgb urine dipstick: NEGATIVE
Specific Gravity, Urine: 1.025 (ref 1.005–1.030)

## 2011-07-08 LAB — POCT PREGNANCY, URINE: Preg Test, Ur: NEGATIVE

## 2011-07-09 ENCOUNTER — Emergency Department (HOSPITAL_COMMUNITY): Payer: Medicaid Other

## 2011-07-09 ENCOUNTER — Emergency Department (HOSPITAL_COMMUNITY)
Admission: EM | Admit: 2011-07-09 | Discharge: 2011-07-09 | Disposition: A | Discharge status: Home / Self Care | Payer: Medicaid Other | Attending: Emergency Medicine | Admitting: Emergency Medicine

## 2011-07-09 DIAGNOSIS — N809 Endometriosis, unspecified: Secondary | ICD-10-CM | POA: Insufficient documentation

## 2011-07-09 DIAGNOSIS — Z79899 Other long term (current) drug therapy: Secondary | ICD-10-CM | POA: Insufficient documentation

## 2011-07-09 DIAGNOSIS — F329 Major depressive disorder, single episode, unspecified: Secondary | ICD-10-CM | POA: Insufficient documentation

## 2011-07-09 DIAGNOSIS — J45909 Unspecified asthma, uncomplicated: Secondary | ICD-10-CM | POA: Insufficient documentation

## 2011-07-09 DIAGNOSIS — D72829 Elevated white blood cell count, unspecified: Secondary | ICD-10-CM | POA: Insufficient documentation

## 2011-07-09 DIAGNOSIS — F3289 Other specified depressive episodes: Secondary | ICD-10-CM | POA: Insufficient documentation

## 2011-07-09 DIAGNOSIS — R1115 Cyclical vomiting syndrome unrelated to migraine: Secondary | ICD-10-CM | POA: Insufficient documentation

## 2011-07-09 DIAGNOSIS — R197 Diarrhea, unspecified: Secondary | ICD-10-CM | POA: Insufficient documentation

## 2011-07-09 DIAGNOSIS — R109 Unspecified abdominal pain: Secondary | ICD-10-CM | POA: Insufficient documentation

## 2011-07-09 DIAGNOSIS — F411 Generalized anxiety disorder: Secondary | ICD-10-CM | POA: Insufficient documentation

## 2011-07-09 LAB — URINALYSIS, ROUTINE W REFLEX MICROSCOPIC
Glucose, UA: NEGATIVE mg/dL
Hgb urine dipstick: NEGATIVE
Protein, ur: NEGATIVE mg/dL
Specific Gravity, Urine: 1.012 (ref 1.005–1.030)

## 2011-07-09 LAB — CBC
MCH: 33 pg (ref 26.0–34.0)
Platelets: 282 10*3/uL (ref 150–400)
RBC: 4.3 MIL/uL (ref 3.87–5.11)
WBC: 19.2 10*3/uL — ABNORMAL HIGH (ref 4.0–10.5)

## 2011-07-09 LAB — WET PREP, GENITAL
Clue Cells Wet Prep HPF POC: NONE SEEN
Trich, Wet Prep: NONE SEEN
Yeast Wet Prep HPF POC: NONE SEEN

## 2011-07-09 LAB — DIFFERENTIAL
Basophils Absolute: 0 10*3/uL (ref 0.0–0.1)
Basophils Relative: 0 % (ref 0–1)
Eosinophils Absolute: 0.2 10*3/uL (ref 0.0–0.7)
Neutrophils Relative %: 80 % — ABNORMAL HIGH (ref 43–77)

## 2011-07-09 LAB — COMPREHENSIVE METABOLIC PANEL
ALT: 13 U/L (ref 0–35)
AST: 14 U/L (ref 0–37)
CO2: 28 mEq/L (ref 19–32)
Calcium: 9.3 mg/dL (ref 8.4–10.5)
Chloride: 102 mEq/L (ref 96–112)
GFR calc non Af Amer: >90 mL/min (ref >90)
Potassium: 4.1 mEq/L (ref 3.5–5.1)
Sodium: 137 mEq/L (ref 135–145)

## 2011-07-09 MED ORDER — IOHEXOL 300 MG/ML  SOLN
75.0000 mL | Freq: Once | INTRAMUSCULAR | Status: AC | PRN
  Administered 2011-07-09: 75 mL via INTRAVENOUS

## 2011-07-10 LAB — GC/CHLAMYDIA PROBE AMP, GENITAL
Chlamydia, DNA Probe: NEGATIVE
GC Probe Amp, Genital: NEGATIVE

## 2011-08-06 ENCOUNTER — Emergency Department (INDEPENDENT_AMBULATORY_CARE_PROVIDER_SITE_OTHER): Payer: Medicaid Other

## 2011-08-06 ENCOUNTER — Encounter: Payer: Self-pay | Admitting: Family Medicine

## 2011-08-06 ENCOUNTER — Emergency Department (HOSPITAL_BASED_OUTPATIENT_CLINIC_OR_DEPARTMENT_OTHER)
Admission: EM | Admit: 2011-08-06 | Discharge: 2011-08-06 | Disposition: A | Discharge status: Home / Self Care | Payer: Medicaid Other | Attending: Emergency Medicine | Admitting: Emergency Medicine

## 2011-08-06 DIAGNOSIS — R509 Fever, unspecified: Secondary | ICD-10-CM

## 2011-08-06 DIAGNOSIS — J45909 Unspecified asthma, uncomplicated: Secondary | ICD-10-CM | POA: Insufficient documentation

## 2011-08-06 DIAGNOSIS — R05 Cough: Secondary | ICD-10-CM

## 2011-08-06 DIAGNOSIS — J069 Acute upper respiratory infection, unspecified: Secondary | ICD-10-CM | POA: Insufficient documentation

## 2011-08-06 MED ORDER — GUAIFENESIN-CODEINE 100-10 MG/5ML PO SYRP: 5.0000 mL | ORAL_SOLUTION | Freq: Three times a day (TID) | ORAL | Status: AC | PRN

## 2011-08-06 MED ORDER — ALBUTEROL SULFATE HFA 108 (90 BASE) MCG/ACT IN AERS
2.0000 | INHALATION_SPRAY | Freq: Once | RESPIRATORY_TRACT | Status: AC
  Administered 2011-08-06: 2 via RESPIRATORY_TRACT
  Filled 2011-08-06: qty 6.7

## 2011-08-06 NOTE — ED Notes (Addendum)
Pt c/o cough, congestion and shortness of breath x 3 days. Pt sts she is "concerned about bronchitis". Pt sts she cannot find her inhaler.

## 2011-08-06 NOTE — ED Provider Notes (Signed)
History    30yF with cough and congestion. 3-4 day hx. Reports just started on amox. No improvement of symptoms. Highest self taken temp 99.1. No n/v. cough nonproductive. No sob. No rash. No unusual leg pain or swelling. Denies hx of blood clot. Runny nose. Feels congested. Ringing in R ear. No ear pain or drainage.  CSN: [id number] Arrival date & time: 08/06/2011  4:47 PM   First MD Initiated Contact with Patient 08/06/11 1647      Chief Complaint  Patient presents with  . Cough    (Consider location/radiation/quality/duration/timing/severity/associated sxs/prior treatment) HPI  Past Medical History  Diagnosis Date  . Asthma     Past Surgical History  Procedure Date  . Cosmetic surgery   . Tympanostomy tube placement   . Abdominal surgery     No family history on file.  History  Substance Use Topics  . Smoking status: Current Everyday Smoker  . Smokeless tobacco: Not on file  . Alcohol Use: Yes    OB History    Grav Para Term Preterm Abortions TAB SAB Ect Mult Living                  Review of Systems   Review of symptoms negative unless otherwise noted in HPI.   Allergies  Vancomycin  Home Medications   Current Outpatient Rx  Name Route Sig Dispense Refill  . ACETAMINOPHEN 500 MG PO TABS Oral Take 1,000 mg by mouth every 6 (six) hours as needed. For pain     . GUAIFENESIN-CODEINE 100-10 MG/5ML PO SYRP Oral Take 5 mLs by mouth 3 (three) times daily as needed for cough. 120 mL 0    BP 135/88  Pulse 100  Temp(Src) 98.4 F (36.9 C) (Oral)  Resp 16  Ht 5\' 2"  (1.575 m)  Wt 100 lb (45.36 kg)  BMI 18.29 kg/m2  SpO2 97%  LMP 07/23/2011  Physical Exam  Nursing note and vitals reviewed. Constitutional: She appears well-developed and well-nourished. No distress.  HENT:  Head: Atraumatic.  Right Ear: External ear normal.  Nose: Nose normal.  Mouth/Throat: Oropharynx is clear and moist. No oropharyngeal exudate.       Abnormal facies consistent  with provided hx of cleft lip/palate  Eyes: Conjunctivae are normal. Right eye exhibits no discharge. Left eye exhibits no discharge.  Neck: Neck supple. No tracheal deviation present.  Cardiovascular: Normal rate, regular rhythm and normal heart sounds.  Exam reveals no gallop and no friction rub.   No murmur heard. Pulmonary/Chest: Effort normal and breath sounds normal. No stridor. No respiratory distress.  Abdominal: Soft. She exhibits no distension. There is no tenderness.  Musculoskeletal: She exhibits no edema and no tenderness.  Lymphadenopathy:    She has no cervical adenopathy.  Neurological: She is alert.  Skin: Skin is warm and dry. No rash noted. She is not diaphoretic.  Psychiatric: She has a normal mood and affect. Her behavior is normal. Thought content normal.    ED Course  Procedures (including critical care time)  Labs Reviewed - No data to display Dg Chest 2 View  08/06/2011  *RADIOLOGY REPORT*  Clinical Data: Productive cough, fever  CHEST - 2 VIEW  Comparison: 04/07/2011  Findings: Lungs are clear. No pleural effusion or pneumothorax.  Cardiomediastinal silhouette is within normal limits.  Mild degenerative changes of the visualized thoracolumbar spine.  IMPRESSION: No evidence of acute cardiopulmonary disease.  Original Report Authenticated By: Charline Bills, M.D.     1. Upper respiratory infection  MDM  30yF with cough and congestion. Suspect URI, likely viral. Afebrile and nontoxic. CXR without focal infiltrate. Lungs clear. Pt with hx of asthma and misplaced inhaler. One provided prior to DC. PRN medication for cough. Counseled concerning harm of smoking and smoking cessation. Pt already being tx'd with amoxicillin. Very low clinical suspicion for sbi, but given already started, feel reasonable to finish course. Pt with no PCP. Resources provided.        Raeford Razor, MD 08/06/11 [phone]

## 2011-08-08 ENCOUNTER — Emergency Department (INDEPENDENT_AMBULATORY_CARE_PROVIDER_SITE_OTHER): Payer: Medicaid Other

## 2011-08-08 ENCOUNTER — Encounter (HOSPITAL_BASED_OUTPATIENT_CLINIC_OR_DEPARTMENT_OTHER): Payer: Self-pay | Admitting: Emergency Medicine

## 2011-08-08 ENCOUNTER — Emergency Department (HOSPITAL_BASED_OUTPATIENT_CLINIC_OR_DEPARTMENT_OTHER)
Admission: EM | Admit: 2011-08-08 | Discharge: 2011-08-08 | Disposition: A | Discharge status: Home / Self Care | Payer: Medicaid Other | Attending: Emergency Medicine | Admitting: Emergency Medicine

## 2011-08-08 DIAGNOSIS — S4980XA Other specified injuries of shoulder and upper arm, unspecified arm, initial encounter: Secondary | ICD-10-CM

## 2011-08-08 DIAGNOSIS — M25569 Pain in unspecified knee: Secondary | ICD-10-CM

## 2011-08-08 DIAGNOSIS — Y92410 Unspecified street and highway as the place of occurrence of the external cause: Secondary | ICD-10-CM | POA: Insufficient documentation

## 2011-08-08 DIAGNOSIS — M542 Cervicalgia: Secondary | ICD-10-CM

## 2011-08-08 DIAGNOSIS — S139XXA Sprain of joints and ligaments of unspecified parts of neck, initial encounter: Secondary | ICD-10-CM | POA: Insufficient documentation

## 2011-08-08 DIAGNOSIS — M25519 Pain in unspecified shoulder: Secondary | ICD-10-CM

## 2011-08-08 DIAGNOSIS — S161XXA Strain of muscle, fascia and tendon at neck level, initial encounter: Secondary | ICD-10-CM

## 2011-08-08 DIAGNOSIS — S99929A Unspecified injury of unspecified foot, initial encounter: Secondary | ICD-10-CM

## 2011-08-08 MED ORDER — HYDROCODONE-ACETAMINOPHEN 5-500 MG PO TABS: 1.0000 | ORAL_TABLET | Freq: Four times a day (QID) | ORAL | Status: AC | PRN
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
## 2011-09-13 ENCOUNTER — Emergency Department (HOSPITAL_BASED_OUTPATIENT_CLINIC_OR_DEPARTMENT_OTHER)
Admission: EM | Admit: 2011-09-13 | Discharge: 2011-09-14 | Disposition: A | Discharge status: Home / Self Care | Payer: Medicaid Other | Attending: Emergency Medicine | Admitting: Emergency Medicine

## 2011-09-13 ENCOUNTER — Encounter (HOSPITAL_BASED_OUTPATIENT_CLINIC_OR_DEPARTMENT_OTHER): Payer: Self-pay | Admitting: *Deleted

## 2011-09-13 DIAGNOSIS — J45909 Unspecified asthma, uncomplicated: Secondary | ICD-10-CM | POA: Insufficient documentation

## 2011-09-13 DIAGNOSIS — J069 Acute upper respiratory infection, unspecified: Secondary | ICD-10-CM | POA: Insufficient documentation

## 2011-09-13 NOTE — ED Provider Notes (Signed)
History     CSN: [id number]  Arrival date & time 09/13/11  2316   First MD Initiated Contact with Patient 09/13/11 2332      Chief Complaint  Patient presents with  . Sore Throat    (Consider location/radiation/quality/duration/timing/severity/associated sxs/prior treatment) HPI Comments: Patient presents with 2 days of sore throat that she describes as a burning sensation.  No fevers.  No shortness of breath.  Patient has some sinus congestion and drainage as well.  No nausea or vomiting.  Patient is a 30 y.o. female presenting with pharyngitis. The history is provided by the patient. No language interpreter was used.  Sore Throat This is a new problem. The current episode started 2 days ago. The problem occurs constantly. The problem has not changed since onset.Pertinent negatives include no chest pain, no abdominal pain, no headaches and no shortness of breath.    Past Medical History  Diagnosis Date  . Asthma     Past Surgical History  Procedure Date  . Cosmetic surgery   . Tympanostomy tube placement   . Abdominal surgery     History reviewed. No pertinent family history.  History  Substance Use Topics  . Smoking status: Current Everyday Smoker  . Smokeless tobacco: Not on file  . Alcohol Use: Yes    OB History    Grav Para Term Preterm Abortions TAB SAB Ect Mult Living                  Review of Systems  Constitutional: Negative.  Negative for fever and chills.  HENT: Positive for congestion, sore throat and rhinorrhea.   Eyes: Negative.  Negative for discharge and redness.  Respiratory: Negative.  Negative for cough and shortness of breath.   Cardiovascular: Negative.  Negative for chest pain.  Gastrointestinal: Negative.  Negative for nausea, vomiting, abdominal pain and diarrhea.  Genitourinary: Negative.  Negative for dysuria and vaginal discharge.  Musculoskeletal: Negative.  Negative for back pain.  Skin: Negative.  Negative for color change and  rash.  Neurological: Negative.  Negative for syncope and headaches.  Hematological: Negative.  Negative for adenopathy.  Psychiatric/Behavioral: Negative.  Negative for confusion.  All other systems reviewed and are negative.    Allergies  Vancomycin  Home Medications   Current Outpatient Rx  Name Route Sig Dispense Refill  . ACETAMINOPHEN 500 MG PO TABS Oral Take 1,000 mg by mouth every 6 (six) hours as needed. For pain     . ALBUTEROL SULFATE HFA 108 (90 BASE) MCG/ACT IN AERS Inhalation Inhale 2 puffs into the lungs every 6 (six) hours as needed. For bronchitis       BP 113/81  Pulse 104  Temp(Src) 98 F (36.7 C) (Oral)  Resp 19  SpO2 94%  LMP 08/21/2011  Physical Exam  Constitutional: She is oriented to person, place, and time. She appears well-developed and well-nourished.  Non-toxic appearance. She does not have a sickly appearance.  HENT:  Head: Normocephalic and atraumatic.  Mouth/Throat: No oropharyngeal exudate.       Mild erythema to posterior oropharynx.  No swelling noted  Eyes: Conjunctivae, EOM and lids are normal. Pupils are equal, round, and reactive to light. No scleral icterus.  Neck: Trachea normal and normal range of motion. Neck supple.  Cardiovascular: Normal rate, regular rhythm and normal heart sounds.   Pulmonary/Chest: Effort normal and breath sounds normal. No respiratory distress. She has no wheezes. She has no rales.  Abdominal: Soft. Normal appearance. There is no  tenderness. There is no rebound, no guarding and no CVA tenderness.  Musculoskeletal: Normal range of motion.  Neurological: She is alert and oriented to person, place, and time. She has normal strength.  Skin: Skin is warm, dry and intact. No rash noted.  Psychiatric: She has a normal mood and affect. Her behavior is normal. Judgment and thought content normal.    ED Course  Procedures (including critical care time)  Labs Reviewed - No data to display No results found.   No  diagnosis found.    MDM  Patient with likely URI that is viral in origin at this time.  Patient has been counseled regarding her smoking.  She has also been counseled regarding the use of over-the-counter cold medicines and Tylenol and ibuprofen for fevers and pain.        Nat Christen, MD 09/13/11 [phone]

## 2011-09-13 NOTE — ED Notes (Signed)
Pt reports sore throat and nasal congestion denies fever sx began today

## 2011-09-14 ENCOUNTER — Encounter (HOSPITAL_BASED_OUTPATIENT_CLINIC_OR_DEPARTMENT_OTHER): Payer: Self-pay

## 2011-09-15 ENCOUNTER — Encounter (HOSPITAL_BASED_OUTPATIENT_CLINIC_OR_DEPARTMENT_OTHER): Payer: Self-pay

## 2011-09-15 ENCOUNTER — Emergency Department (HOSPITAL_BASED_OUTPATIENT_CLINIC_OR_DEPARTMENT_OTHER)
Admission: EM | Admit: 2011-09-15 | Discharge: 2011-09-16 | Discharge status: Against Medical Advice | Payer: Medicaid Other | Attending: Emergency Medicine | Admitting: Emergency Medicine

## 2011-09-15 DIAGNOSIS — R05 Cough: Secondary | ICD-10-CM | POA: Insufficient documentation

## 2011-09-15 DIAGNOSIS — R059 Cough, unspecified: Secondary | ICD-10-CM | POA: Insufficient documentation

## 2011-09-15 NOTE — ED Notes (Signed)
Cough, sinus/chest congestion

## 2011-09-15 NOTE — ED Notes (Signed)
Pt was seen here on 12/24 for cold sxs and now returns c/o body aches, fever, and malaise.

## 2011-09-16 NOTE — ED Notes (Signed)
Pt states she is tired of waiting and wants to f/u with her doctor in the morning. States she has a sick child at home and cannot wait to be seen by the EDP. Pt choosing to leave without being seen. Understands the risks of leaving. Pt alert and ambulatory without difficulty.

## 2011-11-15 ENCOUNTER — Other Ambulatory Visit: Payer: Self-pay

## 2011-11-15 LAB — OB RESULTS CONSOLE ABO/RH: RH Type: POSITIVE

## 2011-11-15 LAB — OB RESULTS CONSOLE HIV ANTIBODY (ROUTINE TESTING): HIV: NONREACTIVE

## 2011-11-15 LAB — OB RESULTS CONSOLE HEPATITIS B SURFACE ANTIGEN: Hepatitis B Surface Ag: NEGATIVE

## 2011-12-05 ENCOUNTER — Encounter (HOSPITAL_COMMUNITY): Payer: Self-pay | Admitting: *Deleted

## 2011-12-05 ENCOUNTER — Inpatient Hospital Stay (HOSPITAL_COMMUNITY): Payer: Medicaid Other

## 2011-12-05 ENCOUNTER — Inpatient Hospital Stay (HOSPITAL_COMMUNITY)
Admission: AD | Admit: 2011-12-05 | Discharge: 2011-12-05 | Disposition: A | Discharge status: Hospice | Payer: Medicaid Other | Source: Ambulatory Visit | Attending: Obstetrics and Gynecology | Admitting: Obstetrics and Gynecology

## 2011-12-05 DIAGNOSIS — R109 Unspecified abdominal pain: Secondary | ICD-10-CM | POA: Insufficient documentation

## 2011-12-05 DIAGNOSIS — O99891 Other specified diseases and conditions complicating pregnancy: Secondary | ICD-10-CM | POA: Insufficient documentation

## 2011-12-05 HISTORY — DX: Cleft hard palate: Q35.1

## 2011-12-05 HISTORY — DX: Headache: R51

## 2011-12-05 HISTORY — DX: Unspecified ovarian cyst, unspecified side: N83.209

## 2011-12-05 HISTORY — DX: Anxiety disorder, unspecified: F41.9

## 2011-12-05 LAB — URINALYSIS, ROUTINE W REFLEX MICROSCOPIC
Bilirubin Urine: NEGATIVE
Leukocytes, UA: NEGATIVE
Nitrite: NEGATIVE
Specific Gravity, Urine: 1.015 (ref 1.005–1.030)
Urobilinogen, UA: 0.2 mg/dL (ref 0.0–1.0)
pH: 7 (ref 5.0–8.0)

## 2011-12-05 LAB — WET PREP, GENITAL

## 2011-12-05 NOTE — Discharge Instructions (Signed)
Abdominal Pain During Pregnancy °Belly (abdominal) pain is common during pregnancy. Most of the time, it is not a serious problem. Other times, it can be a sign that something is wrong with the pregnancy. Always tell your doctor if you have belly pain. °HOME CARE °For mild pain: °· Do not have sex (intercourse) or put anything in your vagina until you feel better.  °· Rest until your pain stops. If your pain lasts longer than 1 hour, call your doctor.  °· Drink clear fluids if you feel sick to your stomach (nauseous).  °· Do not eat solid food until you feel better.  °· Only take medicine as told by your doctor.  °· Keep all doctor visits as told.  °GET HELP RIGHT AWAY IF:  °· You are bleeding, leaking fluid, or pieces of tissue come out of your vagina.  °· You have more pain or cramping.  °· You keep throwing up (vomiting).  °· You have pain when you pee (urinate) or have blood in your pee.  °· You have a fever.  °· You do not feel your baby moving as much.  °· You feel very weak or feel like passing out.  °· You have trouble breathing, with or without belly pain.  °· You have a very bad headache and belly pain.  °· You have fluid leaking from your vagina and belly pain.  °· You keep having watery poop (diarrhea).  °· Your belly pain does not go away after resting, or the pain gets worse.  °MAKE SURE YOU:  °· Understand these instructions.  °· Will watch your condition.  °· Will get help right away if you are not doing well or get worse.  °Document Released: 08/25/2009 Document Revised: 08/26/2011 Document Reviewed: 04/02/2011 °[employer]® Patient Information ©2012 [employer]. °
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
## 2011-12-05 NOTE — MAU Provider Note (Signed)
Pt examined.  Agree w/ above note.  [place] results d/w pt, pt reassured.  Plan to f/u in office as scheduled.

## 2011-12-05 NOTE — MAU Note (Signed)
Pt has already started her prenatal care with Dr [name]' office; G2P1; denies any bleeding but c/o  L sided pain in lower abdomen and lower back that started around 1645; hx of endometriosis and ovarian cysts; had some pain like this before getting pregnant;

## 2011-12-06 LAB — GC/CHLAMYDIA PROBE AMP, GENITAL: Chlamydia, DNA Probe: NEGATIVE

## 2012-01-22 ENCOUNTER — Inpatient Hospital Stay (HOSPITAL_COMMUNITY)
Admission: AD | Admit: 2012-01-22 | Discharge: 2012-01-22 | Disposition: A | Discharge status: Home / Self Care | Payer: Medicaid Other | Source: Ambulatory Visit | Attending: Obstetrics and Gynecology | Admitting: Obstetrics and Gynecology

## 2012-01-22 ENCOUNTER — Inpatient Hospital Stay (HOSPITAL_COMMUNITY): Payer: Medicaid Other

## 2012-01-22 ENCOUNTER — Encounter (HOSPITAL_COMMUNITY): Payer: Self-pay | Admitting: *Deleted

## 2012-01-22 DIAGNOSIS — O26899 Other specified pregnancy related conditions, unspecified trimester: Secondary | ICD-10-CM

## 2012-01-22 DIAGNOSIS — M549 Dorsalgia, unspecified: Secondary | ICD-10-CM | POA: Insufficient documentation

## 2012-01-22 DIAGNOSIS — O2390 Unspecified genitourinary tract infection in pregnancy, unspecified trimester: Secondary | ICD-10-CM | POA: Insufficient documentation

## 2012-01-22 DIAGNOSIS — N390 Urinary tract infection, site not specified: Secondary | ICD-10-CM | POA: Insufficient documentation

## 2012-01-22 DIAGNOSIS — R109 Unspecified abdominal pain: Secondary | ICD-10-CM | POA: Insufficient documentation

## 2012-01-22 HISTORY — DX: Other specified postprocedural states: Z98.890

## 2012-01-22 HISTORY — DX: Other specified postprocedural states: R11.2

## 2012-01-22 LAB — COMPREHENSIVE METABOLIC PANEL
ALT: 12 U/L (ref 0–35)
AST: 12 U/L (ref 0–37)
Albumin: 3.3 g/dL — ABNORMAL LOW (ref 3.5–5.2)
Alkaline Phosphatase: 67 U/L (ref 39–117)
Potassium: 5.1 mEq/L (ref 3.5–5.1)
Sodium: 135 mEq/L (ref 135–145)
Total Protein: 6.1 g/dL (ref 6.0–8.3)

## 2012-01-22 LAB — URINALYSIS, ROUTINE W REFLEX MICROSCOPIC
Bilirubin Urine: NEGATIVE
Glucose, UA: NEGATIVE mg/dL
Leukocytes, UA: NEGATIVE
Nitrite: NEGATIVE
Specific Gravity, Urine: 1.01 (ref 1.005–1.030)
pH: 8 (ref 5.0–8.0)

## 2012-01-22 LAB — URINE MICROSCOPIC-ADD ON

## 2012-01-22 LAB — DIFFERENTIAL
Basophils Relative: 0 % (ref 0–1)
Eosinophils Absolute: 0.2 10*3/uL (ref 0.0–0.7)
Neutro Abs: 11.5 10*3/uL — ABNORMAL HIGH (ref 1.7–7.7)
Neutrophils Relative %: 78 % — ABNORMAL HIGH (ref 43–77)

## 2012-01-22 LAB — CBC
MCH: 32.1 pg (ref 26.0–34.0)
MCHC: 33.9 g/dL (ref 30.0–36.0)
Platelets: 272 10*3/uL (ref 150–400)
RBC: 4.05 MIL/uL (ref 3.87–5.11)

## 2012-01-22 MED ORDER — HYDROCODONE-ACETAMINOPHEN 5-325 MG PO TABS: 1.0000 | ORAL_TABLET | ORAL | Status: AC | PRN

## 2012-01-22 MED ORDER — NITROFURANTOIN MONOHYD MACRO 100 MG PO CAPS: 100.0000 mg | ORAL_CAPSULE | Freq: Two times a day (BID) | ORAL | Status: AC

## 2012-01-22 MED ORDER — HYDROCODONE-ACETAMINOPHEN 5-325 MG PO TABS
1.0000 | ORAL_TABLET | Freq: Once | ORAL | Status: AC
  Administered 2012-01-22: 1 via ORAL
  Filled 2012-01-22: qty 1

## 2012-01-22 NOTE — MAU Note (Signed)
Pt G2 P1 at 16wks having LLQ pain and Left lower back pain.  Denies bleeding or discharge.

## 2012-01-22 NOTE — MAU Provider Note (Signed)
History     CSN: [id number]  Arrival date & time 01/22/12  2952   None     Chief Complaint  Patient presents with  . Abdominal Pain  . Back Pain   HPI Pamela Bailey is a 31 y.o. female @ [redacted]w[redacted]d gestation who presents to MAU for abdominal and back pain. The symptoms started 2 days ago. Abdominal pain is constant and sharp in the left lower quadrant. The pain radiates to the left lower back. She rates the pain as 8/10.  Nothing makes the pain better or worse.  Has taken tylenol without relief.  Nausea but no vomiting. Denies UTI symptoms. Normal BM's. The history was provided by the patient.   Past Medical History  Diagnosis Date  . Asthma   . Headache   . Anxiety   . Ovarian cyst   . Endometriosis   . Cleft lip   . Cleft hard palate   . PONV (postoperative nausea and vomiting)     Past Surgical History  Procedure Date  . Cosmetic surgery   . Tympanostomy tube placement   . Abdominal surgery   . Laparoscopy   . Salpingectomy     Family History  Problem Relation Age of Onset  . Hypertension Maternal Grandfather     History  Substance Use Topics  . Smoking status: Current Everyday Smoker -- 0.2 packs/day  . Smokeless tobacco: Not on file  . Alcohol Use: No    OB History    Grav Para Term Preterm Abortions TAB SAB Ect Mult Living   2 1 1       1       Review of Systems  Constitutional: Negative for fever, chills, diaphoresis and fatigue.  HENT: Negative for ear pain, congestion, sore throat, facial swelling, neck pain, neck stiffness, dental problem and sinus pressure.   Eyes: Negative for photophobia, pain, discharge and visual disturbance.  Respiratory: Negative for cough, chest tightness and wheezing.   Cardiovascular: Negative for chest pain, palpitations and leg swelling.  Gastrointestinal: Positive for nausea and abdominal pain. Negative for vomiting, diarrhea, constipation and abdominal distention.  Genitourinary: Positive for vaginal discharge and  pelvic pain. Negative for dysuria, frequency, flank pain, vaginal bleeding and difficulty urinating.  Musculoskeletal: Negative for myalgias, back pain and gait problem.  Skin: Negative for color change and rash.  Neurological: Positive for headaches. Negative for dizziness, speech difficulty, weakness, light-headedness and numbness.  Psychiatric/Behavioral: Negative for confusion and agitation. The patient is nervous/anxious.     Allergies  Vancomycin and Amoxicillin-pot clavulanate  Home Medications  No current outpatient prescriptions on file.  BP 116/66  Pulse 88  Temp(Src) 98.6 F (37 C) (Oral)  Resp 16  Ht 5\' 1"  (1.549 m)  Wt 98 lb (44.453 kg)  BMI 18.52 kg/m2  LMP 09/22/2011  Physical Exam  Nursing note and vitals reviewed. Constitutional: She is oriented to person, place, and time. She appears well-developed and well-nourished. No distress.  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  Eyes: EOM are normal.  Neck: Neck supple.  Cardiovascular: Normal rate and regular rhythm.   Pulmonary/Chest: Effort normal and breath sounds normal.  Abdominal: Soft. Bowel sounds are normal. There is tenderness in the left lower quadrant. There is CVA tenderness. There is no rigidity, no rebound and no guarding.  Genitourinary:       External genitalia without lesions. White discharge vaginal vault. Cervix closed, thick, minimal CMT.  Mildly tender left adnexa. Uterus consistent with dates.   Musculoskeletal: Normal range of motion.  Neurological: She is alert and oriented to person, place, and time. No cranial nerve deficit.  Skin: Skin is warm and dry.  Psychiatric: She has a normal mood and affect. Her behavior is normal. Judgment and thought content normal.   Results for orders placed during the hospital encounter of 01/22/12 (from the past 24 hour(s))  URINALYSIS, ROUTINE W REFLEX MICROSCOPIC     Status: Abnormal   Collection Time   01/22/12  9:34 AM       Component Value Range   Color, Urine YELLOW  YELLOW    APPearance CLEAR  CLEAR    Specific Gravity, Urine 1.010  1.005 - 1.030    pH 8.0  5.0 - 8.0    Glucose, UA NEGATIVE  NEGATIVE (mg/dL)   Hgb urine dipstick TRACE (*) NEGATIVE    Bilirubin Urine NEGATIVE  NEGATIVE    Ketones, ur NEGATIVE  NEGATIVE (mg/dL)   Protein, ur NEGATIVE  NEGATIVE (mg/dL)   Urobilinogen, UA 0.2  0.0 - 1.0 (mg/dL)   Nitrite NEGATIVE  NEGATIVE    Leukocytes, UA NEGATIVE  NEGATIVE   URINE MICROSCOPIC-ADD ON     Status: Normal   Collection Time   01/22/12  9:34 AM      Component Value Range   Squamous Epithelial / LPF RARE  RARE    WBC, UA 0-2  <3 (WBC/hpf)   RBC / HPF 0-2  <3 (RBC/hpf)   Bacteria, UA RARE  RARE   CBC     Status: Abnormal   Collection Time   01/22/12 10:50 AM      Component Value Range   WBC 14.8 (*) 4.0 - 10.5 (K/uL)   RBC 4.05  3.87 - 5.11 (MIL/uL)   Hemoglobin 13.0  12.0 - 15.0 (g/dL)   HCT 16.1  09.6 - 04.5 (%)   MCV 94.6  78.0 - 100.0 (fL)   MCH 32.1  26.0 - 34.0 (pg)   MCHC 33.9  30.0 - 36.0 (g/dL)   RDW 40.9  81.1 - 91.4 (%)   Platelets 272  150 - 400 (K/uL)  DIFFERENTIAL     Status: Abnormal   Collection Time   01/22/12 10:50 AM      Component Value Range   Neutrophils Relative 78 (*) 43 - 77 (%)   Neutro Abs 11.5 (*) 1.7 - 7.7 (K/uL)   Lymphocytes Relative 14  12 - 46 (%)   Lymphs Abs 2.1  0.7 - 4.0 (K/uL)   Monocytes Relative 6  3 - 12 (%)   Monocytes Absolute 0.9  0.1 - 1.0 (K/uL)   Eosinophils Relative 2  0 - 5 (%)   Eosinophils Absolute 0.2  0.0 - 0.7 (K/uL)   Basophils Relative 0  0 - 1 (%)   Basophils Absolute 0.0  0.0 - 0.1 (K/uL)  COMPREHENSIVE METABOLIC PANEL     Status: Abnormal   Collection Time   01/22/12 10:50 AM      Component Value Range   Sodium 135  135 - 145 (mEq/L)   Potassium 5.1  3.5 - 5.1 (mEq/L)   Chloride 102  96 - 112 (mEq/L)   CO2 27  19 - 32 (mEq/L)   Glucose, Bld 76  70 - 99 (mg/dL)   BUN 9  6 - 23 (mg/dL)   Creatinine, Ser 7.82  0.50 - 1.10  (mg/dL)   Calcium 9.7  8.4 - 95.6 (  mg/dL)   Total Protein 6.1  6.0 - 8.3 (g/dL)   Albumin 3.3 (*) 3.5 - 5.2 (g/dL)   AST 12  0 - 37 (U/L)   ALT 12  0 - 35 (U/L)   Alkaline Phosphatase 67  39 - 117 (U/L)   Total Bilirubin 0.2 (*) 0.3 - 1.2 (mg/dL)   GFR calc non Af Amer >90  >90 (mL/min)   GFR calc Af Amer >90  >90 (mL/min)   US Renal  01/22/2012  *RADIOLOGY REPORT*  Clinical Data:  16-week pregnant patient with left flank pain and hematuria  RENAL/URINARY TRACT ULTRASOUND COMPLETE  Comparison:  None.  Findings:  Right Kidney:  Normal in size and parenchymal echogenicity.  No evidence of mass or hydronephrosis.  Left Kidney:  Normal in size and parenchymal echogenicity.  No evidence of mass or hydronephrosis.  Bladder:  Appears normal for degree of bladder distention. Bilateral ureteral jets are demonstrated.  IMPRESSION: Normal study.  Original Report Authenticated By: Brandon Melnick, M.D.    Assessment: Abdominal pain in pregnancy  Diff Dx: UTI   Round ligament pain  Plan:  Culture urine   Rx Macrobid   Rx hydrocodone   Follow up in the office   Return as needed  ED Course I have reviewed this patient's vital signs, nurses notes, appropriate labs and imaging.  I have discussed this patient with Dr. Arelia Sneddon.  Procedures  MDM

## 2012-01-22 NOTE — Discharge Instructions (Signed)
Call the office on Monday for follow up. Return here as needed.  Abdominal Pain During Pregnancy Abdominal discomfort is common in pregnancy. Most of the time, it does not cause harm. There are many causes of abdominal pain. Some causes are more serious than others. Some of the causes of abdominal pain in pregnancy are easily diagnosed. Occasionally, the diagnosis takes time to understand. Other times, the cause is not determined. Abdominal pain can be a sign that something is very wrong with the pregnancy, or the pain may have nothing to do with the pregnancy at all. For this reason, always tell your caregiver if you have any abdominal discomfort. CAUSES Common and harmless causes of abdominal pain include:  Constipation.   Excess gas and bloating.   Round ligament pain. This is pain that is felt in the folds of the groin.   The position the baby or placenta is in.   Baby kicks.   Braxton-Hicks contractions. These are mild contractions that do not cause cervical dilation.  Serious causes of abdominal pain include:  Ectopic pregnancy. This happens when a fertilized egg implants outside of the uterus.   Miscarriage.   Preterm labor. This is when labor starts at less than 37 weeks of pregnancy.   Placental abruption. This is when the placenta partially or completely separates from the uterus.   Preeclampsia. This is often associated with high blood pressure and has been referred to as "toxemia in pregnancy."   Uterine or amniotic fluid infections.  Causes unrelated to pregnancy include:  Urinary tract infection.   Gallbladder stones or inflammation.   Hepatitis or other liver illness.   Intestinal problems, stomach flu, food poisoning, or ulcer.   Appendicitis.   Kidney (renal) stones.   Kidney infection (pylonephritis).  HOME CARE INSTRUCTIONS  For mild pain:  Do not have sexual intercourse or put anything in your vagina until your symptoms go away completely.   Get  plenty of rest until your pain improves. If your pain does not improve in 1 hour, call your caregiver.   Drink clear fluids if you feel nauseous. Avoid solid food as long as you are uncomfortable or nauseous.   Only take medicine as directed by your caregiver.   Keep all follow-up appointments with your caregiver.  SEEK IMMEDIATE MEDICAL CARE IF:  You are bleeding, leaking fluid, or passing tissue from the vagina.   You have increasing pain or cramping.   You have persistent vomiting.   You have painful or bloody urination.   You have a fever.   You notice a decrease in your baby's movements.   You have extreme weakness or feel faint.   You have shortness of breath, with or without abdominal pain.   You develop a severe headache with abdominal pain.   You have abnormal vaginal discharge with abdominal pain.   You have persistent diarrhea.   You have abdominal pain that continues even after rest, or gets worse.  MAKE SURE YOU:   Understand these instructions.   Will watch your condition.   Will get help right away if you are not doing well or get worse.  Document Released: 09/06/2005 Document Revised: 08/26/2011 Document Reviewed: 04/02/2011 [facility] Patient Information 2012 [place], [place].Abdominal Pain During Pregnancy Abdominal discomfort is common in pregnancy. Most of the time, it does not cause harm. There are many causes of abdominal pain. Some causes are more serious than others. Some of the causes of abdominal pain in pregnancy are easily diagnosed. Occasionally,  the diagnosis takes time to understand. Other times, the cause is not determined. Abdominal pain can be a sign that something is very wrong with the pregnancy, or the pain may have nothing to do with the pregnancy at all. For this reason, always tell your caregiver if you have any abdominal discomfort. CAUSES Common and harmless causes of abdominal pain include:  Constipation.   Excess gas and  bloating.   Round ligament pain. This is pain that is felt in the folds of the groin.   The position the baby or placenta is in.   Baby kicks.   Braxton-Hicks contractions. These are mild contractions that do not cause cervical dilation.  Serious causes of abdominal pain include:  Ectopic pregnancy. This happens when a fertilized egg implants outside of the uterus.   Miscarriage.   Preterm labor. This is when labor starts at less than 37 weeks of pregnancy.   Placental abruption. This is when the placenta partially or completely separates from the uterus.   Preeclampsia. This is often associated with high blood pressure and has been referred to as "toxemia in pregnancy."   Uterine or amniotic fluid infections.  Causes unrelated to pregnancy include:  Urinary tract infection.   Gallbladder stones or inflammation.   Hepatitis or other liver illness.   Intestinal problems, stomach flu, food poisoning, or ulcer.   Appendicitis.   Kidney (renal) stones.   Kidney infection (pylonephritis).  HOME CARE INSTRUCTIONS  For mild pain:  Do not have sexual intercourse or put anything in your vagina until your symptoms go away completely.   Get plenty of rest until your pain improves. If your pain does not improve in 1 hour, call your caregiver.   Drink clear fluids if you feel nauseous. Avoid solid food as long as you are uncomfortable or nauseous.   Only take medicine as directed by your caregiver.   Keep all follow-up appointments with your caregiver.  SEEK IMMEDIATE MEDICAL CARE IF:  You are bleeding, leaking fluid, or passing tissue from the vagina.   You have increasing pain or cramping.   You have persistent vomiting.   You have painful or bloody urination.   You have a fever.   You notice a decrease in your baby's movements.   You have extreme weakness or feel faint.   You have shortness of breath, with or without abdominal pain.   You develop a severe  headache with abdominal pain.   You have abnormal vaginal discharge with abdominal pain.   You have persistent diarrhea.   You have abdominal pain that continues even after rest, or gets worse.  MAKE SURE YOU:   Understand these instructions.   Will watch your condition.   Will get help right away if you are not doing well or get worse.  Document Released: 09/06/2005 Document Revised: 08/26/2011 Document Reviewed: 04/02/2011 [facility] Patient Information 2012 [place], [place].

## 2012-01-23 LAB — URINE CULTURE
Colony Count: NO GROWTH
Culture  Setup Time: 201305042016

## 2012-02-07 ENCOUNTER — Other Ambulatory Visit (HOSPITAL_COMMUNITY): Payer: Self-pay | Admitting: Obstetrics and Gynecology

## 2012-02-07 DIAGNOSIS — O358XX0 Maternal care for other (suspected) fetal abnormality and damage, not applicable or unspecified: Secondary | ICD-10-CM

## 2012-02-10 ENCOUNTER — Ambulatory Visit (HOSPITAL_COMMUNITY): Payer: Medicaid Other

## 2012-02-11 ENCOUNTER — Other Ambulatory Visit (HOSPITAL_COMMUNITY): Payer: Self-pay | Admitting: Obstetrics and Gynecology

## 2012-02-11 ENCOUNTER — Ambulatory Visit (HOSPITAL_COMMUNITY)
Admission: RE | Admit: 2012-02-11 | Discharge: 2012-02-11 | Disposition: A | Discharge status: Home / Self Care | Payer: Medicaid Other | Source: Ambulatory Visit | Attending: Obstetrics and Gynecology | Admitting: Obstetrics and Gynecology

## 2012-02-11 VITALS — BP 114/61 | HR 106 | Wt 99.0 lb

## 2012-02-11 DIAGNOSIS — O09219 Supervision of pregnancy with history of pre-term labor, unspecified trimester: Secondary | ICD-10-CM | POA: Insufficient documentation

## 2012-02-11 DIAGNOSIS — Z8751 Personal history of pre-term labor: Secondary | ICD-10-CM

## 2012-02-11 DIAGNOSIS — O358XX0 Maternal care for other (suspected) fetal abnormality and damage, not applicable or unspecified: Secondary | ICD-10-CM

## 2012-02-11 DIAGNOSIS — Z1389 Encounter for screening for other disorder: Secondary | ICD-10-CM | POA: Insufficient documentation

## 2012-02-11 DIAGNOSIS — Z363 Encounter for antenatal screening for malformations: Secondary | ICD-10-CM | POA: Insufficient documentation

## 2012-02-11 DIAGNOSIS — O99330 Smoking (tobacco) complicating pregnancy, unspecified trimester: Secondary | ICD-10-CM | POA: Insufficient documentation

## 2012-02-11 NOTE — Progress Notes (Signed)
Pamela Bailey was seen for ultrasound appointment today.  Please see [facility] report for details.

## 2012-02-22 ENCOUNTER — Inpatient Hospital Stay (HOSPITAL_COMMUNITY)
Admission: AD | Admit: 2012-02-22 | Discharge: 2012-02-22 | Disposition: A | Discharge status: Home / Self Care | Payer: Medicaid Other | Source: Ambulatory Visit | Attending: Obstetrics and Gynecology | Admitting: Obstetrics and Gynecology

## 2012-02-22 ENCOUNTER — Encounter (HOSPITAL_COMMUNITY): Payer: Self-pay

## 2012-02-22 DIAGNOSIS — N949 Unspecified condition associated with female genital organs and menstrual cycle: Secondary | ICD-10-CM

## 2012-02-22 DIAGNOSIS — M545 Low back pain, unspecified: Secondary | ICD-10-CM | POA: Insufficient documentation

## 2012-02-22 DIAGNOSIS — O99891 Other specified diseases and conditions complicating pregnancy: Secondary | ICD-10-CM | POA: Insufficient documentation

## 2012-02-22 DIAGNOSIS — R1031 Right lower quadrant pain: Secondary | ICD-10-CM | POA: Insufficient documentation

## 2012-02-22 LAB — URINALYSIS, ROUTINE W REFLEX MICROSCOPIC
Nitrite: NEGATIVE
Protein, ur: NEGATIVE mg/dL
Specific Gravity, Urine: 1.02 (ref 1.005–1.030)
Urobilinogen, UA: 0.2 mg/dL (ref 0.0–1.0)

## 2012-02-22 LAB — URINE MICROSCOPIC-ADD ON

## 2012-02-22 MED ORDER — ACETAMINOPHEN 500 MG PO TABS
1000.0000 mg | ORAL_TABLET | Freq: Once | ORAL | Status: AC
  Administered 2012-02-22: 1000 mg via ORAL
  Filled 2012-02-22: qty 2

## 2012-02-22 NOTE — Discharge Instructions (Signed)

## 2012-02-22 NOTE — MAU Provider Note (Signed)
Chief Complaint:  Abdominal Pain    First Provider Initiated Contact with Patient 02/22/12 Pamela Bailey is  31 y.o. G2P1001.  Patient's last menstrual period was 09/22/2011.. [redacted]w[redacted]d  She presents complaining of Abdominal Pain . Onset is described as ongoing and has been present for  3.5 hours. Pt states has had suprapubic pain x3 hours, pain is on left lower abdomen radiating to left lower back. Has hx PTL, on bedrest at 24 weeks. Denies uti s/s. Denies vaginal d/c changes or bleeding. Reports calling office and was instructed to come to MAU for evaluation.  Obstetrical/Gynecological History: OB History    Grav Para Term Preterm Abortions TAB SAB Ect Mult Living   2 1 1       1       Past Medical History: Past Medical History  Diagnosis Date  . Asthma   . Headache   . Anxiety   . Ovarian cyst   . Endometriosis   . Cleft lip   . Cleft hard palate   . PONV (postoperative nausea and vomiting)     Past Surgical History: Past Surgical History  Procedure Date  . Cosmetic surgery   . Tympanostomy tube placement   . Abdominal surgery   . Laparoscopy   . Salpingectomy     Family History: Family History  Problem Relation Age of Onset  . Hypertension Maternal Grandfather   . Anesthesia problems Neg Hx     Social History: History  Substance Use Topics  . Smoking status: Current Everyday Smoker -- 0.5 packs/day    Types: Cigarettes  . Smokeless tobacco: Never Used  . Alcohol Use: No    Allergies:  Allergies  Allergen Reactions  . Vancomycin     Started to go into renal failure   . Amoxicillin-Pot Clavulanate Nausea And Vomiting and Rash    Prescriptions prior to admission  Medication Sig Dispense Refill  . acetaminophen (TYLENOL) 500 MG tablet Take 500 mg by mouth every 6 (six) hours as needed. For pain      . albuterol (PROVENTIL HFA;VENTOLIN HFA) 108 (90 BASE) MCG/ACT inhaler Inhale 2 puffs into the lungs every 6 (six) hours as needed. For asthma  attacks (hasn't used in 6 months)      . Prenatal Vit-Fe Fumarate-FA (PRENATAL MULTIVITAMIN) TABS Take 1 tablet by mouth every morning.         Review of Systems - Negative except what has been reviewed in HPI  Physical Exam   Blood pressure 121/65, pulse 85, temperature 98.5 F (36.9 C), temperature source Oral, resp. rate 20, height 5\' 2"  (1.575 m), weight 99 lb 12.8 oz (45.269 kg), last menstrual period 09/22/2011.  General: General appearance - alert, well appearing, and in no distress and oriented to person, place, and time Mental status - alert, oriented to person, place, and time, normal mood, behavior, speech, dress, motor activity, and thought processes, affect appropriate to mood Abdomen - tenderness noted LLQ, suprapubic, no rebound, no guarding Back exam - full range of motion, no tenderness, palpable spasm or pain on motion, tenderness noted left sacroiliac joint Focused Gynecological Exam: VULVA: normal appearing vulva with no masses, tenderness or lesions, VAGINA: normal appearing vagina with normal color and discharge, no lesions, CERVIX: Close/post/thick  Labs: Recent Results (from the past 24 hour(s))  URINALYSIS, ROUTINE W REFLEX MICROSCOPIC   Collection Time   02/22/12  6:00 PM      Component Value Range   Color, Urine  YELLOW  YELLOW    APPearance CLEAR  CLEAR    Specific Gravity, Urine 1.020  1.005 - 1.030    pH 7.0  5.0 - 8.0    Glucose, UA NEGATIVE  NEGATIVE (mg/dL)   Hgb urine dipstick TRACE (*) NEGATIVE    Bilirubin Urine NEGATIVE  NEGATIVE    Ketones, ur NEGATIVE  NEGATIVE (mg/dL)   Protein, ur NEGATIVE  NEGATIVE (mg/dL)   Urobilinogen, UA 0.2  0.0 - 1.0 (mg/dL)   Nitrite NEGATIVE  NEGATIVE    Leukocytes, UA NEGATIVE  NEGATIVE   URINE MICROSCOPIC-ADD ON   Collection Time   02/22/12  6:00 PM      Component Value Range   Squamous Epithelial / LPF FEW (*) RARE    RBC / HPF 0-2  <3 (RBC/hpf)   Bacteria, UA FEW (*) RARE    MD Consult: Discussed with Dr.  Vincente Poli  Imaging Studies:   Assessment: RLP  Plan: Discharge home FU in office as scheduled on 03/01/2012  Jasyn Mey E. 02/22/2012,7:29 PM

## 2012-02-22 NOTE — MAU Note (Signed)
Pain in LLQ started 3 hrs ago.constant.  Threw up about 1 hour ago.

## 2012-02-22 NOTE — MAU Note (Signed)
Pt states has had suprapubic pain x3 hours, pain is on left lower abdomen radiating to left lower back. Has hx PTL, on bedrest at 24 weeks. Denies uti s/s. Denies vaginal d/c changes or bleeding.

## 2012-03-08 ENCOUNTER — Ambulatory Visit (HOSPITAL_COMMUNITY): Payer: Medicaid Other

## 2012-03-10 ENCOUNTER — Ambulatory Visit (HOSPITAL_COMMUNITY)
Admission: RE | Admit: 2012-03-10 | Discharge: 2012-03-10 | Disposition: A | Discharge status: Home / Self Care | Payer: Medicaid Other | Source: Ambulatory Visit | Attending: Obstetrics and Gynecology | Admitting: Obstetrics and Gynecology

## 2012-03-10 DIAGNOSIS — O99330 Smoking (tobacco) complicating pregnancy, unspecified trimester: Secondary | ICD-10-CM | POA: Insufficient documentation

## 2012-03-10 DIAGNOSIS — O09219 Supervision of pregnancy with history of pre-term labor, unspecified trimester: Secondary | ICD-10-CM | POA: Insufficient documentation

## 2012-03-10 DIAGNOSIS — O358XX0 Maternal care for other (suspected) fetal abnormality and damage, not applicable or unspecified: Secondary | ICD-10-CM | POA: Insufficient documentation

## 2012-04-03 ENCOUNTER — Encounter (HOSPITAL_COMMUNITY): Payer: Self-pay | Admitting: *Deleted

## 2012-04-03 ENCOUNTER — Inpatient Hospital Stay (HOSPITAL_COMMUNITY): Payer: Medicaid Other

## 2012-04-03 ENCOUNTER — Inpatient Hospital Stay (HOSPITAL_COMMUNITY)
Admission: AD | Admit: 2012-04-03 | Discharge: 2012-04-03 | Disposition: A | Discharge status: Hospice | Payer: Medicaid Other | Source: Ambulatory Visit | Attending: Obstetrics and Gynecology | Admitting: Obstetrics and Gynecology

## 2012-04-03 DIAGNOSIS — O4700 False labor before 37 completed weeks of gestation, unspecified trimester: Secondary | ICD-10-CM | POA: Insufficient documentation

## 2012-04-03 DIAGNOSIS — O359XX0 Maternal care for (suspected) fetal abnormality and damage, unspecified, not applicable or unspecified: Secondary | ICD-10-CM

## 2012-04-03 LAB — URINE MICROSCOPIC-ADD ON

## 2012-04-03 LAB — URINALYSIS, ROUTINE W REFLEX MICROSCOPIC
Bilirubin Urine: NEGATIVE
Glucose, UA: 500 mg/dL — AB
Protein, ur: NEGATIVE mg/dL

## 2012-04-03 NOTE — MAU Note (Signed)
Pt presents to MAU after MD office visit today for U/S for BPP, AFI, and fetal measurements.  Pt with occassional contractions. Pt denies bleeding and slight amount of mucous today.

## 2012-04-03 NOTE — MAU Provider Note (Signed)
History     CSN: [id number]  Arrival date and time: 04/03/12 1652   None     Chief Complaint  Patient presents with  . preterm eval    HPI This is a 31 y.o. female at [redacted]w[redacted]d who presents from office. She reportedly measured small today and was sent over for an [place], BPP, and measurements with AFI. Cervix checked in office and was fingertip. Has had some intermittent contractions.  No bleeding.   RN NOTE: Measuring small. Had pain and pressure, started on Sat. Noted mucous d/c this weekend. Was a fingertip in office  OB History    Grav Para Term Preterm Abortions TAB SAB Ect Mult Living   2 1 1       1       Past Medical History  Diagnosis Date  . Asthma   . Headache   . Anxiety   . Ovarian cyst   . Endometriosis   . Cleft lip   . Cleft hard palate   . PONV (postoperative nausea and vomiting)     Past Surgical History  Procedure Date  . Cosmetic surgery   . Tympanostomy tube placement   . Abdominal surgery   . Laparoscopy   . Salpingectomy   . No past surgeries     Family History  Problem Relation Age of Onset  . Hypertension Maternal Grandfather   . Anesthesia problems Neg Hx     History  Substance Use Topics  . Smoking status: Current Everyday Smoker -- 0.5 packs/day for 5 years    Types: Cigarettes  . Smokeless tobacco: Never Used  . Alcohol Use: No    Allergies:  Allergies  Allergen Reactions  . Vancomycin     Started to go into renal failure   . Amoxicillin-Pot Clavulanate Nausea And Vomiting and Rash    Prescriptions prior to admission  Medication Sig Dispense Refill  . acetaminophen (TYLENOL) 500 MG tablet Take 500 mg by mouth every 6 (six) hours as needed. For pain      . albuterol (PROVENTIL HFA;VENTOLIN HFA) 108 (90 BASE) MCG/ACT inhaler Inhale 2 puffs into the lungs every 6 (six) hours as needed. For asthma attacks (hasn't used in 6 months)      . Prenatal Vit-Fe Fumarate-FA (PRENATAL MULTIVITAMIN) TABS Take 1 tablet by mouth every  morning.         ROS As listed in HPI  Physical Exam   Blood pressure 118/59, pulse 99, temperature 98.7 F (37.1 C), temperature source Oral, resp. rate 18, height 5\' 1"  (1.549 m), weight 103 lb (46.72 kg), last menstrual period 09/22/2011.  Physical Exam  Constitutional: She is oriented to person, place, and time. She appears well-developed and well-nourished. No distress.  Cardiovascular: Normal rate.   Respiratory: Effort normal.  GI: Soft. She exhibits no distension and no mass. There is no tenderness. There is no rebound and no guarding.  Musculoskeletal: Normal range of motion.  Neurological: She is alert and oriented to person, place, and time.  Skin: Skin is warm and dry.  Psychiatric: She has a normal mood and affect.    MAU Course  Procedures  MDM Discussed with Dr Marcelle Overlie. He recommends discharge and the office will call her in AM with followup appointment.  Assessment and Plan  A:  SIUP at [redacted]w[redacted]d      Size < Dates in office, but [place] measures 49%ile      BPP 8/8      Normal AFI P:  Discharge  home       Followup in office       Return if contractions or decreased FM  [facility] 04/03/2012, 6:31 PM

## 2012-04-03 NOTE — MAU Note (Signed)
Measuring small.  Had pain and pressure, started on Sat. Noted mucous d/c this weekend.  Was a fingertip in office.

## 2012-04-05 ENCOUNTER — Encounter (HOSPITAL_COMMUNITY): Payer: Self-pay

## 2012-04-05 ENCOUNTER — Inpatient Hospital Stay (HOSPITAL_COMMUNITY): Payer: Medicaid Other

## 2012-04-05 ENCOUNTER — Inpatient Hospital Stay (HOSPITAL_COMMUNITY)
Admission: AD | Admit: 2012-04-05 | Discharge: 2012-04-07 | DRG: 778 | Disposition: A | Discharge status: Home / Self Care | Payer: Medicaid Other | Source: Ambulatory Visit | Attending: Obstetrics and Gynecology | Admitting: Obstetrics and Gynecology

## 2012-04-05 DIAGNOSIS — O26879 Cervical shortening, unspecified trimester: Secondary | ICD-10-CM | POA: Diagnosis present

## 2012-04-05 DIAGNOSIS — O4700 False labor before 37 completed weeks of gestation, unspecified trimester: Principal | ICD-10-CM | POA: Diagnosis present

## 2012-04-05 HISTORY — DX: Reserved for concepts with insufficient information to code with codable children: IMO0002

## 2012-04-05 HISTORY — DX: Depression, unspecified: F32.A

## 2012-04-05 HISTORY — DX: Major depressive disorder, single episode, unspecified: F32.9

## 2012-04-05 HISTORY — DX: Unspecified abnormal cytological findings in specimens from cervix uteri: R87.619

## 2012-04-05 LAB — CBC
HCT: 35.1 % — ABNORMAL LOW (ref 36.0–46.0)
Hemoglobin: 12 g/dL (ref 12.0–15.0)
MCH: 32.5 pg (ref 26.0–34.0)
MCHC: 34.2 g/dL (ref 30.0–36.0)
RBC: 3.69 MIL/uL — ABNORMAL LOW (ref 3.87–5.11)

## 2012-04-05 LAB — URINALYSIS, ROUTINE W REFLEX MICROSCOPIC
Nitrite: NEGATIVE
Specific Gravity, Urine: 1.01 (ref 1.005–1.030)
Urobilinogen, UA: 0.2 mg/dL (ref 0.0–1.0)
pH: 7 (ref 5.0–8.0)

## 2012-04-05 LAB — URINE MICROSCOPIC-ADD ON

## 2012-04-05 MED ORDER — CALCIUM CARBONATE ANTACID 500 MG PO CHEW: 2.0000 | CHEWABLE_TABLET | ORAL | Status: DC | PRN

## 2012-04-05 MED ORDER — ACETAMINOPHEN 325 MG PO TABS
650.0000 mg | ORAL_TABLET | ORAL | Status: DC | PRN
  Administered 2012-04-06: 650 mg via ORAL
  Filled 2012-04-05: qty 2

## 2012-04-05 MED ORDER — BETAMETHASONE SOD PHOS & ACET 6 (3-3) MG/ML IJ SUSP
12.0000 mg | INTRAMUSCULAR | Status: AC
  Administered 2012-04-06 – 2012-04-07 (×2): 12 mg via INTRAMUSCULAR
  Filled 2012-04-05 (×2): qty 2

## 2012-04-05 MED ORDER — ZOLPIDEM TARTRATE 5 MG PO TABS: 5.0000 mg | ORAL_TABLET | Freq: Every evening | ORAL | Status: DC | PRN

## 2012-04-05 MED ORDER — ACETAMINOPHEN 325 MG PO TABS
650.0000 mg | ORAL_TABLET | Freq: Once | ORAL | Status: AC
  Administered 2012-04-05: 650 mg via ORAL
  Filled 2012-04-05: qty 2

## 2012-04-05 MED ORDER — MAGNESIUM SULFATE BOLUS VIA INFUSION
4.0000 g | Freq: Once | INTRAVENOUS | Status: AC
  Administered 2012-04-05: 4 g via INTRAVENOUS
  Filled 2012-04-05: qty 500

## 2012-04-05 MED ORDER — PRENATAL MULTIVITAMIN CH
1.0000 | ORAL_TABLET | Freq: Every day | ORAL | Status: DC
  Administered 2012-04-06 – 2012-04-07 (×2): 1 via ORAL
  Filled 2012-04-05 (×2): qty 1

## 2012-04-05 MED ORDER — LACTATED RINGERS IV SOLN
INTRAVENOUS | Status: DC
  Administered 2012-04-05 – 2012-04-06 (×2): via INTRAVENOUS
  Administered 2012-04-06: 100 mL/h via INTRAVENOUS
  Administered 2012-04-07: 03:00:00 via INTRAVENOUS

## 2012-04-05 MED ORDER — CYCLOBENZAPRINE HCL 10 MG PO TABS
10.0000 mg | ORAL_TABLET | Freq: Once | ORAL | Status: AC
  Administered 2012-04-05: 10 mg via ORAL
  Filled 2012-04-05: qty 1

## 2012-04-05 MED ORDER — DOCUSATE SODIUM 100 MG PO CAPS
100.0000 mg | ORAL_CAPSULE | Freq: Every day | ORAL | Status: DC
  Administered 2012-04-06 – 2012-04-07 (×2): 100 mg via ORAL
  Filled 2012-04-05 (×2): qty 1

## 2012-04-05 MED ORDER — LACTATED RINGERS IV SOLN: INTRAVENOUS | Status: DC

## 2012-04-05 MED ORDER — MAGNESIUM SULFATE 40 G IN LACTATED RINGERS - SIMPLE
2.0000 g/h | INTRAVENOUS | Status: DC
  Administered 2012-04-06 (×2): 2 g/h via INTRAVENOUS
  Filled 2012-04-05 (×3): qty 500

## 2012-04-05 NOTE — H&P (Signed)
Pamela Bailey is a 31 y.o. female presenting for C/O low back and abdominal pain. Pain comes/goes, sometimes worse if sitting up. No ROM/bleeding/HA. No epigastric pain. U/S on 04/03/12 showed cx = 3.0 cm.  FFN today in office negative. History OB History    Grav Para Term Preterm Abortions TAB SAB Ect Mult Living   2 1 1       1      Past Medical History  Diagnosis Date  . Asthma   . Headache   . Anxiety   . Ovarian cyst   . Endometriosis   . Cleft lip   . Cleft hard palate   . PONV (postoperative nausea and vomiting)    Past Surgical History  Procedure Date  . Cosmetic surgery   . Tympanostomy tube placement   . Abdominal surgery   . Laparoscopy   . Salpingectomy   . No past surgeries    Family History: family history includes Hypertension in her maternal grandfather.  There is no history of Anesthesia problems. Social History:  reports that she has been smoking Cigarettes.  She has a 2.5 pack-year smoking history. She has never used smokeless tobacco. She reports that she does not drink alcohol or use illicit drugs.   Prenatal Transfer Tool  Maternal Diabetes: No Genetic Screening: Normal Maternal Ultrasounds/Referrals: Abnormal:  Findings:   Other: Please see prenatal record for details Fetal Ultrasounds or other Referrals:  None Maternal Substance Abuse:  No Significant Maternal Medications:  None Significant Maternal Lab Results:  None Other Comments:  None  Review of Systems  Eyes: Negative for blurred vision.  Gastrointestinal: Positive for abdominal pain.       Low abdominal and low back pain sometimes comes and goes.  Neurological: Negative for headaches.      Blood pressure 121/69, pulse 88, temperature 98.6 F (37 C), temperature source Oral, resp. rate 16, height 5\' 1"  (1.549 m), weight 47.174 kg (104 lb), last menstrual period 09/22/2011, SpO2 100.00%.   Fetal Exam Fetal Monitor Review: Pattern: accelerations present.       Physical Exam    Cardiovascular: Normal rate and regular rhythm.   Respiratory: Effort normal and breath sounds normal.  GI: There is no tenderness.  Neurological: She has normal reflexes.   Cx 1 cm at ext os per NP, no change. U/S tonight shows funneling/dynamic cervix measuring 6 mm with funneling. Prenatal labs: ABO, Rh:   Antibody:   Rubella:   RPR:    HBsAg:    HIV:    GBS:     Assessment/Plan: 31 yo G2P1 at 84 4/7 weeks with cervical shortening and probable PTL. History of PTL and prolonged BR with pregnancy #1 with eventual 37 week delivery. Will admit for magnesium sulfate, betamethasone, BR.   Aubri Gathright II,Aleigh Grunden E 04/05/2012, 11:10 PM

## 2012-04-05 NOTE — MAU Provider Note (Signed)
History     CSN: [id number]  Arrival date and time: 04/05/12 2013   First Provider Initiated Contact with Patient 04/05/12 2036      Chief Complaint  Patient presents with  . Back Pain   HPI Pamela Bailey is 31 y.o. G2P1001 [redacted]w[redacted]d weeks presenting with contractions.  Was sent by the office to MAU on  7/15 for ? Small for dates in the office--ultrasound here, BPP 8/8.  She was fingertip dilated at that visit.  Patient of Dr. Vance Gather, seen in the office today--cervix fingertip dilated in the office today.  Went home and began with lower abdominal pain, that spreads to the sides and back. Back pain is becoming more constant.   Urine normal in the office per patient report.  Right foot was tingling earlier.     Past Medical History  Diagnosis Date  . Asthma   . Headache   . Anxiety   . Ovarian cyst   . Endometriosis   . Cleft lip   . Cleft hard palate   . PONV (postoperative nausea and vomiting)     Past Surgical History  Procedure Date  . Cosmetic surgery   . Tympanostomy tube placement   . Abdominal surgery   . Laparoscopy   . Salpingectomy   . No past surgeries     Family History  Problem Relation Age of Onset  . Hypertension Maternal Grandfather   . Anesthesia problems Neg Hx     History  Substance Use Topics  . Smoking status: Current Everyday Smoker -- 0.5 packs/day for 5 years    Types: Cigarettes  . Smokeless tobacco: Never Used  . Alcohol Use: No    Allergies:  Allergies  Allergen Reactions  . Vancomycin     Started to go into renal failure   . Amoxicillin-Pot Clavulanate Nausea And Vomiting and Rash    Prescriptions prior to admission  Medication Sig Dispense Refill  . acetaminophen (TYLENOL) 500 MG tablet Take 500 mg by mouth every 6 (six) hours as needed. For pain      . albuterol (PROVENTIL HFA;VENTOLIN HFA) 108 (90 BASE) MCG/ACT inhaler Inhale 2 puffs into the lungs every 6 (six) hours as needed. For asthma attacks (hasn't used in 6 months)       . Prenatal Vit-Fe Fumarate-FA (PRENATAL MULTIVITAMIN) TABS Take 1 tablet by mouth every morning.         Review of Systems  Constitutional: Negative.   Respiratory: Negative.   Cardiovascular: Negative.   Gastrointestinal: Positive for abdominal pain.  Genitourinary: Negative.   Musculoskeletal: Positive for back pain.   Physical Exam   Blood pressure 121/69, pulse 88, temperature 98.6 F (37 C), temperature source Oral, resp. rate 16, height 5\' 1"  (1.549 m), weight 104 lb (47.174 kg), last menstrual period 09/22/2011, SpO2 100.00%.  Physical Exam  Constitutional: She is oriented to person, place, and time. She appears well-developed and well-nourished. No distress.  HENT:  Head: Normocephalic.  Neck: Normal range of motion.  Cardiovascular: Normal rate.   Respiratory: Effort normal.  GI: There is no tenderness.       + gravid.   Genitourinary:       Fingertip in office this afternoon. Not repeated.  Cervical length by ultrasound  Musculoskeletal:       + for lower back discomfort  Neurological: She is alert and oriented to person, place, and time.  Skin: Skin is warm and dry.  Psychiatric: She has a normal mood and affect.  Her behavior is normal.    Results for orders placed during the hospital encounter of 04/05/12 (from the past 24 hour(s))  URINALYSIS, ROUTINE W REFLEX MICROSCOPIC     Status: Abnormal   Collection Time   04/05/12  8:25 PM      Component Value Range   Color, Urine YELLOW  YELLOW   APPearance CLEAR  CLEAR   Specific Gravity, Urine 1.010  1.005 - 1.030   pH 7.0  5.0 - 8.0   Glucose, UA NEGATIVE  NEGATIVE mg/dL   Hgb urine dipstick TRACE (*) NEGATIVE   Bilirubin Urine NEGATIVE  NEGATIVE   Ketones, ur NEGATIVE  NEGATIVE mg/dL   Protein, ur NEGATIVE  NEGATIVE mg/dL   Urobilinogen, UA 0.2  0.0 - 1.0 mg/dL   Nitrite NEGATIVE  NEGATIVE   Leukocytes, UA NEGATIVE  NEGATIVE  URINE MICROSCOPIC-ADD ON     Status: Normal   Collection Time   04/05/12  8:25  PM      Component Value Range   Squamous Epithelial / LPF RARE  RARE   RBC / HPF 0-2  <3 RBC/hpf    MAU Course  Procedures  MDM 21:40  Dr. Henderson Cloud in the room to see patient.  Order for Tylenol and Flexeril 10mg  po now.  U/S for cervical length.   Fetal monitor strip was reviewed by Dr. Henderson Cloud.             Per Dr. Henderson Cloud, FFN done in the office was negative today.    22:45  Reported cervical length to Dr. Henderson Cloud. Admit to Antenatal.  He will put in orders  Assessment and Plan  A:  Back Pain at [redacted]w[redacted]d gestation.       Shortening of cervical length     R/O preterm labor  P:  Admit  KEY,[name] 04/05/2012, 8:39 PM

## 2012-04-05 NOTE — MAU Note (Signed)
Pt reports lower back pain all day, states she was seen here on Monday with contractions. Seen in office today, now cervical change (FT). Also reports lower abd , bilateral side pain. Also thinks she may have leaking some fluid about 30 minutes ago.

## 2012-04-06 NOTE — Progress Notes (Signed)
Pt requesting to go out to smoke. Rn explained to pt that she couldn't leave the unit or go outside to smoke while on Magnesium Sulfate and continuous monitoring. Pt understanding. RN offered patch but pt reports that breaks her skin out. Pt wanting to know if she can smoke an electronic cigarette. RN asked Chiropodist, who called Director and then facilities. Awaiting an ok from them, then will call MD and find out how she feels about it. Pt agreeable to the plan of care. S.Zaira Iacovelli,RN

## 2012-04-06 NOTE — Progress Notes (Signed)
31 yo G2P1 @ 26+5 wks with ACE and h/o PTL (resulting in FT delivery) Pt reports pain resolved.  No vb or lof.  + FM  AF, VSS.  Good UOP FHT reassuring Toco - quiet Cvx - deferred  A/P:  Continue bedrest Regular diet BMZ #2 today (midnight) Will d/c mag in morning  Rpt cervical length in morning

## 2012-04-07 ENCOUNTER — Inpatient Hospital Stay (HOSPITAL_COMMUNITY): Payer: Medicaid Other

## 2012-04-07 MED ORDER — NIFEDIPINE 10 MG PO CAPS: 10.0000 mg | ORAL_CAPSULE | Freq: Four times a day (QID) | ORAL | Status: DC

## 2012-04-07 MED ORDER — NIFEDIPINE 10 MG PO CAPS
10.0000 mg | ORAL_CAPSULE | Freq: Four times a day (QID) | ORAL | Status: DC
  Administered 2012-04-07 (×2): 10 mg via ORAL
  Filled 2012-04-07 (×2): qty 1

## 2012-04-07 MED ORDER — SODIUM CHLORIDE 0.9 % IJ SOLN: 3.0000 mL | Freq: Two times a day (BID) | INTRAMUSCULAR | Status: DC

## 2012-04-07 NOTE — Progress Notes (Signed)
To whom it may concern...  Pamela Bailey has been an inpatient at [facility] since 7/17. Pamela Bailey has been here at the hospital as her support person since admission.  Thank you.   Valinda Hoar, RNC

## 2012-04-07 NOTE — Progress Notes (Signed)
Patient ID: Pamela Bailey, female   DOB: 11/04/1980, 31 y.o.   MRN: [id number] Pt without complaints VSSAF FHR 150s Ctx rare  PTL at 26 6/7 SP BMZ Wean off mag to Procardia [place] of cervix today DL

## 2012-04-07 NOTE — Progress Notes (Signed)
UR Chart review completed.  

## 2012-04-07 NOTE — Discharge Summary (Signed)
Physician Discharge Summary  Patient ID: SHAWNTEL FARNWORTH MRN: [id number] DOB/AGE: 11/13/1980 31 y.o.  Admit date: 04/05/2012 Discharge date: 04/07/2012  Admission Diagnoses:  Discharge Diagnoses:  Active Problems:  * No active hospital problems. *    Discharged Condition: good  Hospital Course: admitted with PTL and cx change despite neg FFN.  Mag sulfate until quiscent and received BMZ x 2.  Transitioned to Procardia with good results and fu US showed Cx length 2.8 cm without funneling (improved from 0.8 with funneling)  Consults: None  Significant Diagnostic Studies: see US findings above  Treatments: IV hydration and steroids: beta methasone  Discharge Exam: Blood pressure 118/64, pulse 97, temperature 98.8 F (37.1 C), temperature source Oral, resp. rate 18, height 5\' 1"  (1.549 m), weight 47.174 kg (104 lb), last menstrual period 09/22/2011, SpO2 100.00%. General appearance: alert, cooperative and appears stated age Cx improved by [place] 2.8 cm Disposition: 50-Hospice/Home  Discharge Orders    Future Appointments: Provider: Department: Dept Phone: Center:   04/21/2012 8:30 AM [facility] [place] 2 [facility] Ul[facility]ound [phone] [facility]     Future Orders Please Complete By Expires   Diet general      Increase activity slowly      Call MD for:  temperature >100.4      Call MD for:  persistant nausea and vomiting      Call MD for:  severe uncontrolled pain      Call MD for:  redness, tenderness, or signs of infection (pain, swelling, redness, odor or green/yellow discharge around incision site)      Call MD for:  difficulty breathing, headache or visual disturbances      Sexual Activity Restrictions      Comments:   Pelvic rest   Other Restrictions      Comments:   Bedrest with PTL warnings   Discharge instructions      Comments:   FU Tuesday as scheduled   OB RESULTS CONSOLE RPR      Comments:   This external order was created through the Results Console.   OB RESULTS CONSOLE  HIV antibody      Comments:   This external order was created through the Results Console.   OB RESULTS CONSOLE Rubella Antibody      Comments:   This external order was created through the Results Console.   OB RESULTS CONSOLE Hepatitis B surface antigen      Comments:   This external order was created through the Results Console.   OB RESULTS CONSOLE ABO/Rh      Comments:   This external order was created through the Results Console.   OB RESULTS CONSOLE Antibody Screen      Comments:   This external order was created through the Results Console.     Medication List  As of 04/07/2012  3:52 PM   STOP taking these medications         albuterol 108 (90 BASE) MCG/ACT inhaler         TAKE these medications         acetaminophen 500 MG tablet   Commonly known as: TYLENOL   Take 500 mg by mouth every 6 (six) hours as needed. For pain      NIFEdipine 10 MG capsule   Commonly known as: PROCARDIA   Take 1 capsule (10 mg total) by mouth every 6 (six) hours.      prenatal multivitamin Tabs   Take 1 tablet by mouth every morning.  Signed: Turner Daniels 04/07/2012, 3:52 PM

## 2012-04-21 ENCOUNTER — Ambulatory Visit (HOSPITAL_COMMUNITY)
Admission: RE | Admit: 2012-04-21 | Discharge: 2012-04-21 | Disposition: A | Discharge status: Home / Self Care | Payer: Medicaid Other | Source: Ambulatory Visit | Attending: Obstetrics and Gynecology | Admitting: Obstetrics and Gynecology

## 2012-04-21 ENCOUNTER — Encounter (HOSPITAL_COMMUNITY): Payer: Self-pay

## 2012-04-21 VITALS — BP 111/59 | HR 100 | Wt 104.0 lb

## 2012-04-21 DIAGNOSIS — O358XX0 Maternal care for other (suspected) fetal abnormality and damage, not applicable or unspecified: Secondary | ICD-10-CM

## 2012-04-21 DIAGNOSIS — O99330 Smoking (tobacco) complicating pregnancy, unspecified trimester: Secondary | ICD-10-CM | POA: Insufficient documentation

## 2012-05-05 ENCOUNTER — Encounter (HOSPITAL_COMMUNITY): Payer: Self-pay | Admitting: *Deleted

## 2012-05-05 ENCOUNTER — Observation Stay (HOSPITAL_COMMUNITY)
Admission: AD | Admit: 2012-05-05 | Discharge: 2012-05-06 | Disposition: A | Discharge status: Home / Self Care | Payer: Medicaid Other | Source: Ambulatory Visit | Attending: Obstetrics and Gynecology | Admitting: Obstetrics and Gynecology

## 2012-05-05 DIAGNOSIS — O4700 False labor before 37 completed weeks of gestation, unspecified trimester: Principal | ICD-10-CM | POA: Insufficient documentation

## 2012-05-05 DIAGNOSIS — O26879 Cervical shortening, unspecified trimester: Secondary | ICD-10-CM | POA: Insufficient documentation

## 2012-05-05 MED ORDER — PRENATAL MULTIVITAMIN CH: 1.0000 | ORAL_TABLET | Freq: Every morning | ORAL | Status: DC

## 2012-05-05 MED ORDER — NIFEDIPINE 10 MG PO CAPS
10.0000 mg | ORAL_CAPSULE | Freq: Four times a day (QID) | ORAL | Status: DC
  Administered 2012-05-05 – 2012-05-06 (×4): 10 mg via ORAL
  Filled 2012-05-05 (×4): qty 1

## 2012-05-05 MED ORDER — ACETAMINOPHEN 500 MG PO TABS: 500.0000 mg | ORAL_TABLET | Freq: Four times a day (QID) | ORAL | Status: DC | PRN

## 2012-05-05 MED ORDER — DOCUSATE SODIUM 100 MG PO CAPS
100.0000 mg | ORAL_CAPSULE | Freq: Every day | ORAL | Status: DC
  Administered 2012-05-06: 100 mg via ORAL
  Filled 2012-05-05: qty 1

## 2012-05-05 MED ORDER — PRENATAL MULTIVITAMIN CH
1.0000 | ORAL_TABLET | Freq: Every day | ORAL | Status: DC
  Administered 2012-05-06: 1 via ORAL
  Filled 2012-05-05: qty 1

## 2012-05-05 MED ORDER — CALCIUM CARBONATE ANTACID 500 MG PO CHEW: 2.0000 | CHEWABLE_TABLET | ORAL | Status: DC | PRN

## 2012-05-05 MED ORDER — ZOLPIDEM TARTRATE 5 MG PO TABS
5.0000 mg | ORAL_TABLET | Freq: Every evening | ORAL | Status: DC | PRN
  Administered 2012-05-05: 5 mg via ORAL
  Filled 2012-05-05: qty 1

## 2012-05-05 MED ORDER — ACETAMINOPHEN 325 MG PO TABS: 650.0000 mg | ORAL_TABLET | ORAL | Status: DC | PRN

## 2012-05-05 NOTE — H&P (Signed)
31 yo G2P1 presents to office w/ 3 days h/o increase in ctx.  No vb or lof.  + FM.  Infant w/ known bilateral cleft lip. Pt was admitted last mo for PTL and received BMZ & Mag.  Has been stable at home on procardia.    Past History - See hollister:  H/o PTL, resulted in full term delivery after hospital bedrest       AF, VSS + FHT Toco - none Gen - NAD Abd - gravid, nt Ext - no edema  [place] - cvx 1.8 cm w/ funneling (decreased from 2.8cm)  A/P:  PTL with shortening cervical length Bedrest Recheck cervical length in am Tocolysis prn Continue procardia

## 2012-05-05 NOTE — Plan of Care (Signed)
Problem: Consults Goal: Birthing Suites Patient Information Press F2 to bring up selections list   Pt < [redacted] weeks EGA     

## 2012-05-06 ENCOUNTER — Inpatient Hospital Stay (HOSPITAL_COMMUNITY): Payer: Medicaid Other

## 2012-05-06 NOTE — Progress Notes (Signed)
RN called to report [place] results.  TVUS shows cervical length of 2.1cm (stable).  Plan for home bedrest.  Continue procardia

## 2012-05-06 NOTE — Progress Notes (Signed)
31yo G2P1 @ 31 wks admitted w/ PTL.  Denies ctx, vb and lof Good FM  FHT reassuring Toco none Cvx - deferred  A/P:  Recheck cervical length this morning. Assess ability for home bedrest based on these results

## 2012-05-06 NOTE — Discharge Summary (Signed)
Physician Discharge Summary  Patient ID: Pamela Bailey MRN: [id number] DOB/AGE: [birth year] 32 y.o.  Admit date: 05/05/2012 Discharge date: 05/06/2012  Admission Diagnoses:  PTL   Discharge Diagnoses: same Active Problems:  * No active hospital problems. *    Discharged Condition: stable  Hospital Course: Pt was admitted to evaluate for preterm labor.  Cervical length in the office was shortened to 1.8cm which was shorter than her previous measurment.  She was c/o 3 days of pressure.  On admission, she was not contracting and FHT remained reassuring.  She was on bedrest overnight.  HD#2, her cervical length was stable on repeat TVUS at 2.1cm and she was felt to be stable for home bedrest and close office f/u.    Consults: none  Significant Diagnostic Studies: ultrasound  Treatments: none  Discharge Exam: Blood pressure 116/67, pulse 78, temperature 97.7 F (36.5 C), temperature source Oral, resp. rate 18, height 5\' 2"  (1.575 m), weight 48.393 kg (106 lb 11 oz), last menstrual period 09/22/2011.   Disposition: 01-Home or Self Care  Discharge Orders    Future Appointments: Provider: Department: Dept Phone: Center:   05/19/2012 8:30 AM [facility] [place] 2 [facility] Ultrasound [phone] [facility]     Medication List  As of 05/06/2012 11:45 AM   TAKE these medications         acetaminophen 500 MG tablet   Commonly known as: TYLENOL   Take 500 mg by mouth every 6 (six) hours as needed. For pain      NIFEdipine 10 MG capsule   Commonly known as: PROCARDIA   Take 1 capsule (10 mg total) by mouth every 6 (six) hours.      prenatal multivitamin Tabs   Take 1 tablet by mouth every morning.           Follow-up Information    Schedule an appointment as soon as possible for a visit in 1 week to follow up.         Signed: Kentley Blyden 05/06/2012, 11:45 AM

## 2012-05-08 ENCOUNTER — Other Ambulatory Visit (HOSPITAL_COMMUNITY): Payer: Medicaid Other

## 2012-05-08 LAB — CULTURE, BETA STREP (GROUP B ONLY)

## 2012-05-10 NOTE — Progress Notes (Signed)
Ur chart review completed.  

## 2012-05-14 ENCOUNTER — Encounter (HOSPITAL_COMMUNITY): Payer: Self-pay | Admitting: *Deleted

## 2012-05-14 ENCOUNTER — Inpatient Hospital Stay (HOSPITAL_COMMUNITY)
Admission: AD | Admit: 2012-05-14 | Discharge: 2012-05-14 | Disposition: A | Discharge status: Home / Self Care | Payer: Medicaid Other | Source: Ambulatory Visit | Attending: Obstetrics & Gynecology | Admitting: Obstetrics & Gynecology

## 2012-05-14 DIAGNOSIS — R109 Unspecified abdominal pain: Secondary | ICD-10-CM | POA: Insufficient documentation

## 2012-05-14 DIAGNOSIS — O26899 Other specified pregnancy related conditions, unspecified trimester: Secondary | ICD-10-CM

## 2012-05-14 DIAGNOSIS — M549 Dorsalgia, unspecified: Secondary | ICD-10-CM | POA: Insufficient documentation

## 2012-05-14 DIAGNOSIS — O99891 Other specified diseases and conditions complicating pregnancy: Secondary | ICD-10-CM | POA: Insufficient documentation

## 2012-05-14 DIAGNOSIS — J329 Chronic sinusitis, unspecified: Secondary | ICD-10-CM

## 2012-05-14 LAB — URINALYSIS, ROUTINE W REFLEX MICROSCOPIC
Nitrite: NEGATIVE
Specific Gravity, Urine: 1.015 (ref 1.005–1.030)
Urobilinogen, UA: 0.2 mg/dL (ref 0.0–1.0)

## 2012-05-14 LAB — URINE MICROSCOPIC-ADD ON

## 2012-05-14 MED ORDER — AZITHROMYCIN 250 MG PO TABS: 250.0000 mg | ORAL_TABLET | Freq: Every day | ORAL | Status: AC

## 2012-05-14 MED ORDER — HYDROCODONE-ACETAMINOPHEN 5-325 MG PO TABS
1.0000 | ORAL_TABLET | Freq: Once | ORAL | Status: AC
  Administered 2012-05-14: 1 via ORAL
  Filled 2012-05-14: qty 1

## 2012-05-14 NOTE — MAU Note (Signed)
Vomiting this morning. Started leaking something. Unsure if its urine or amniotic fluid. Back pain started around 5pm this evening.

## 2012-05-14 NOTE — MAU Provider Note (Signed)
History     CSN: [id number]  Arrival date and time: 05/14/12 4098   None     Chief Complaint  Patient presents with  . Back Pain  . Emesis During Pregnancy   HPI Pamela Bailey is a.age female @ [redacted]w[redacted]d gestation who presents to MAU with back pain. The pain started approximately 5pm. She describes the pain as sharp that comes and goes. The pain is located in the mid back and radiates to the lower back and around to the abdomen. Associated symptoms include dysuria, nausea and vomiting. Leaking urine with vomiting. Denies vaginal bleeding. She also complains of nasal congestion, watery eyes and sinus congestion.The history was provided by the patient.  OB History    Grav Para Term Preterm Abortions TAB SAB Ect Mult Living   2 1 1       1       Past Medical History  Diagnosis Date  . Asthma   . Headache   . Anxiety   . Ovarian cyst   . Endometriosis   . Cleft lip   . Cleft hard palate   . PONV (postoperative nausea and vomiting)   . Depression   . Abnormal Pap smear     colpo 5/09    Past Surgical History  Procedure Date  . Cosmetic surgery   . Tympanostomy tube placement   . Abdominal surgery   . Laparoscopy   . Salpingectomy   . No past surgeries     Family History  Problem Relation Age of Onset  . Hypertension Maternal Grandfather   . Anesthesia problems Neg Hx     History  Substance Use Topics  . Smoking status: Current Everyday Smoker -- 0.5 packs/day for 5 years    Types: Cigarettes  . Smokeless tobacco: Never Used  . Alcohol Use: No    Allergies:  Allergies  Allergen Reactions  . Vancomycin     Started to go into renal failure   . Amoxicillin-Pot Clavulanate Nausea And Vomiting and Rash    Prescriptions prior to admission  Medication Sig Dispense Refill  . acetaminophen (TYLENOL) 500 MG tablet Take 500 mg by mouth every 6 (six) hours as needed. For pain      . NIFEdipine (PROCARDIA) 10 MG capsule Take 1 capsule (10 mg total) by mouth every  6 (six) hours.  120 capsule  3  . Prenatal Vit-Fe Fumarate-FA (PRENATAL MULTIVITAMIN) TABS Take 1 tablet by mouth every morning.         Review of Systems  Constitutional: Negative for fever, chills and weight loss.  HENT: Positive for congestion. Negative for ear pain, nosebleeds, sore throat and neck pain.   Eyes: Negative for blurred vision, double vision, photophobia and pain.  Respiratory: Negative for cough, shortness of breath and wheezing.   Cardiovascular: Negative for chest pain, palpitations and leg swelling.  Gastrointestinal: Positive for nausea, vomiting and abdominal pain. Negative for heartburn, diarrhea and constipation.  Genitourinary: Positive for dysuria and frequency. Negative for urgency.  Musculoskeletal: Positive for back pain. Negative for myalgias.  Skin: Negative for itching and rash.  Neurological: Negative for dizziness, sensory change, speech change, seizures, weakness and headaches.  Endo/Heme/Allergies: Does not bruise/bleed easily.  Psychiatric/Behavioral: Negative for depression. The patient has insomnia. The patient is not nervous/anxious.    Blood pressure 121/63, pulse 112, temperature 98.5 F (36.9 C), temperature source Oral, resp. rate 18, height 5\' 2"  (1.575 m), weight 107 lb 2 oz (48.592 kg), last menstrual period 09/22/2011.  Physical Exam  Nursing note and vitals reviewed. Constitutional: She is oriented to person, place, and time. She appears well-developed and well-nourished. No distress.  HENT:  Head: Atraumatic.       Cleft lip and palate. Facial deformity right side.  Neck: Neck supple.  Cardiovascular:       tachycardia  Respiratory: Effort normal.  GI: Soft.       Minimal tenderness lower abdomen with palpation. There is minimal tender in the left lumbar area with palpation.  Genitourinary:       External genitalia without lesions. Mucous discharge vaginal vault, no pooling noted. Cervix 1.5 cm, 60%, -3. Uterus consistent with  dates.  Musculoskeletal: Normal range of motion.  Neurological: She is alert and oriented to person, place, and time.  Skin: Skin is warm and dry.  Psychiatric: She has a normal mood and affect. Her behavior is normal. Judgment and thought content normal.   EFM: Reactive tracing, no contractions  Results for orders placed during the hospital encounter of 05/14/12 (from the past 24 hour(s))  URINALYSIS, ROUTINE W REFLEX MICROSCOPIC     Status: Abnormal   Collection Time   05/14/12  7:32 PM      Component Value Range   Color, Urine YELLOW  YELLOW   APPearance CLEAR  CLEAR   Specific Gravity, Urine 1.015  1.005 - 1.030   pH 6.5  5.0 - 8.0   Glucose, UA NEGATIVE  NEGATIVE mg/dL   Hgb urine dipstick SMALL (*) NEGATIVE   Bilirubin Urine NEGATIVE  NEGATIVE   Ketones, ur NEGATIVE  NEGATIVE mg/dL   Protein, ur NEGATIVE  NEGATIVE mg/dL   Urobilinogen, UA 0.2  0.0 - 1.0 mg/dL   Nitrite NEGATIVE  NEGATIVE   Leukocytes, UA NEGATIVE  NEGATIVE  URINE MICROSCOPIC-ADD ON     Status: Abnormal   Collection Time   05/14/12  7:32 PM      Component Value Range   Squamous Epithelial / LPF RARE  RARE   WBC, UA 0-2  <3 WBC/hpf   RBC / HPF 3-6  <3 RBC/hpf   Bacteria, UA FEW (*) RARE   Assessment: Sinusitis    Back pain in prgnancy   Abdominal pain in pregnancy  Plan:  Urine culture sent   Fern test negative   Treat sinusitis   Follow up in the office MAU Course: Discussed with Dr. Langston Masker. Will send urine for culture and treat sinusitis.   Procedures  Pamela Bailey  Home Medication Instructions EAV:[id number]   Printed on:05/14/12 2145  Medication Information                    Prenatal Vit-Fe Fumarate-FA (PRENATAL MULTIVITAMIN) TABS Take 1 tablet by mouth every morning.            acetaminophen (TYLENOL) 500 MG tablet Take 500 mg by mouth every 6 (six) hours as needed. For pain           NIFEdipine (PROCARDIA) 10 MG capsule Take 1 capsule (10 mg total) by mouth every 6 (six) hours.            azithromycin (ZITHROMAX) 250 MG tablet Take 1 tablet (250 mg total) by mouth daily. Take first 2 tablets together, then 1 every day until finished.            Follow-up Information    Call MORRIS, MEGAN, DO.   Contact information:   [address], [address] [place], [address] [employer]  [place] [address] [phone]        Discussed with the patient and all questioned fully answered. She will call or return if any problems arise. Eula Mazzola, RN, FNP, [facility] 05/14/2012, 8:14 PM

## 2012-05-16 LAB — URINE CULTURE: Colony Count: NO GROWTH

## 2012-05-19 ENCOUNTER — Ambulatory Visit (HOSPITAL_COMMUNITY)
Admission: RE | Admit: 2012-05-19 | Discharge: 2012-05-19 | Disposition: A | Discharge status: Home / Self Care | Payer: Medicaid Other | Source: Ambulatory Visit | Attending: Obstetrics and Gynecology | Admitting: Obstetrics and Gynecology

## 2012-05-19 ENCOUNTER — Other Ambulatory Visit (HOSPITAL_COMMUNITY): Payer: Self-pay | Admitting: Maternal and Fetal Medicine

## 2012-05-19 VITALS — BP 111/71 | HR 93 | Wt 108.0 lb

## 2012-05-19 DIAGNOSIS — O358XX0 Maternal care for other (suspected) fetal abnormality and damage, not applicable or unspecified: Secondary | ICD-10-CM

## 2012-05-19 DIAGNOSIS — O09219 Supervision of pregnancy with history of pre-term labor, unspecified trimester: Secondary | ICD-10-CM | POA: Insufficient documentation

## 2012-05-19 DIAGNOSIS — O99330 Smoking (tobacco) complicating pregnancy, unspecified trimester: Secondary | ICD-10-CM | POA: Insufficient documentation

## 2012-05-19 DIAGNOSIS — IMO0002 Reserved for concepts with insufficient information to code with codable children: Secondary | ICD-10-CM

## 2012-05-19 NOTE — Addendum Note (Signed)
Encounter addended by: Alessandra Bevels. Chase Picket, RN on: 05/19/2012 11:41 AM<BR>     Documentation filed: Charges VN, Flowsheet VN

## 2012-05-19 NOTE — Progress Notes (Signed)
Pamela Bailey  was seen today for an ultrasound appointment.  See full report in [facility].  Alpha Gula, MD  IUP at 32+6 weeks Isolated bilateral cleft lip and palate All other interval fetal anatomy was seen and appeared normal   Normal amniotic fluid volume  The EFW today is at 18th %tile; the [facility] measures at the 7th %tile Normal umbilical artery Doppler studies without evidence of absent or reversed diastolic flow   BPP 8/10 (-2 for lack of sustained breathing movement)  Patient has been seen by Plastic surgery at [facility]  Recommend 2x weekly NSTs Follow up growth scan in 3 weeks

## 2012-05-19 NOTE — ED Notes (Signed)
Patient does experience occasional cramping. She reports good fetal movement, does have some occasional leaking which has been evaluated by her OB.

## 2012-05-20 ENCOUNTER — Encounter (HOSPITAL_COMMUNITY): Payer: Self-pay

## 2012-05-20 ENCOUNTER — Inpatient Hospital Stay (HOSPITAL_COMMUNITY)
Admission: AD | Admit: 2012-05-20 | Discharge: 2012-05-20 | Disposition: A | Discharge status: Home / Self Care | Payer: Medicaid Other | Source: Ambulatory Visit | Attending: Obstetrics and Gynecology | Admitting: Obstetrics and Gynecology

## 2012-05-20 DIAGNOSIS — O4700 False labor before 37 completed weeks of gestation, unspecified trimester: Secondary | ICD-10-CM | POA: Insufficient documentation

## 2012-05-20 DIAGNOSIS — O479 False labor, unspecified: Secondary | ICD-10-CM

## 2012-05-20 LAB — POCT FERN TEST

## 2012-05-20 MED ORDER — TERBUTALINE SULFATE 1 MG/ML IJ SOLN
INTRAMUSCULAR | Status: AC
  Administered 2012-05-20: 0.25 mg via SUBCUTANEOUS
  Filled 2012-05-20: qty 1

## 2012-05-20 MED ORDER — TERBUTALINE SULFATE 1 MG/ML IJ SOLN
0.2500 mg | Freq: Once | INTRAMUSCULAR | Status: AC
  Administered 2012-05-20: 0.25 mg via SUBCUTANEOUS

## 2012-05-20 NOTE — MAU Provider Note (Signed)
Chief Complaint:  Labor Eval and Vaginal Discharge   First Provider Initiated Contact with Patient 05/20/12 1550      HPI: Pamela Bailey is a 31 y.o. G2P1001 at 1w0dwho presents to maternity admissions reporting leaking 1-2 inch diameter spots of fluid since midnight and worsening contractions. Unsure of the color of fluid. Denies vaginal bleeding. Good fetal movement.   Pregnancy Course: Followed by [facility] for bilat cleft lip and palate    Past Medical History: Past Medical History  Diagnosis Date  . Asthma   . Headache   . Anxiety   . Ovarian cyst   . Endometriosis   . Cleft lip   . Cleft hard palate   . PONV (postoperative nausea and vomiting)   . Depression   . Abnormal Pap smear     colpo 5/09    Past obstetric history: OB History    Grav Para Term Preterm Abortions TAB SAB Ect Mult Living   2 1 1       1      # Outc Date GA Lbr Len/2nd Wgt Sex Del Anes PTL Lv   1 TRM       EPI     2 CUR               Past Surgical History: Past Surgical History  Procedure Date  . Cosmetic surgery   . Tympanostomy tube placement   . Abdominal surgery   . Laparoscopy   . Salpingectomy   . No past surgeries     Family History: Family History  Problem Relation Age of Onset  . Hypertension Maternal Grandfather   . Anesthesia problems Neg Hx     Social History: History  Substance Use Topics  . Smoking status: Current Everyday Smoker -- 0.5 packs/day for 5 years    Types: Cigarettes  . Smokeless tobacco: Never Used  . Alcohol Use: No    Allergies:  Allergies  Allergen Reactions  . Vancomycin Other (See Comments)    Acute kidney injury   . Amoxicillin-Pot Clavulanate Nausea And Vomiting and Rash    Meds:  Prescriptions prior to admission  Medication Sig Dispense Refill  . acetaminophen (TYLENOL) 500 MG tablet Take 500 mg by mouth every 6 (six) hours as needed. For pain      . Prenatal Vit-Fe Fumarate-FA (PRENATAL MULTIVITAMIN) TABS Take 1 tablet by mouth  every morning.       Marland Kitchen azithromycin (ZITHROMAX) 250 MG tablet Take 1 tablet (250 mg total) by mouth daily. Take first 2 tablets together, then 1 every day until finished.  6 tablet  0    ROS: Pertinent findings in history of present illness.  Physical Exam  Blood pressure 108/62, pulse 100, temperature 98.4 F (36.9 C), temperature source Oral, resp. rate 16, height 5\' 3"  (1.6 m), weight 48.444 kg (106 lb 12.8 oz), last menstrual period 09/22/2011, SpO2 100.00%. GENERAL: Well-developed, well-nourished female in mild distress.  HEENT: normocephalic HEART: normal rate RESP: normal effort ABDOMEN: Soft, non-tender, gravid appropriate for gestational age EXTREMITIES: Nontender, no edema NEURO: alert and oriented SPECULUM EXAM: NEFG, moderate amount of mucous, neg pooling, no blood, cervix visually closed, non-friable. Dilation: 1 Effacement (%): 50 Station: -2 Presentation: Vertex Exam by:: [place] CNM  FHT:  Baseline 140 , moderate variability, accelerations present, mild varibale decelerations Contractions: Periods of mild UC's q 2 mins, then irreg, frequent UI   Labs: Fern neg  Imaging:  NA  ED Course Contractions resolved with terbutaline.  Assessment: 1. Preterm contractions    Plan: Discharge home Preterm labor precautions and fetal kick counts  Medication List  As of 05/20/2012  4:52 PM   ASK your doctor about these medications         acetaminophen 500 MG tablet   Commonly known as: TYLENOL   Take 500 mg by mouth every 6 (six) hours as needed. For pain      azithromycin 250 MG tablet   Commonly known as: ZITHROMAX   Take 1 tablet (250 mg total) by mouth daily. Take first 2 tablets together, then 1 every day until finished.      prenatal multivitamin Tabs   Take 1 tablet by mouth every morning.           Follow-up Information    Follow up with Jeani Hawking, MD on 05/24/2012.   Contact information:   [address] [place] [address] [phone]       Follow up with [facility]. (As needed if symptoms worsen)    Contact information:   [address] [place] [address] [phone]        Dorathy Kinsman, [place] 05/20/2012 4:52 PM

## 2012-05-20 NOTE — MAU Note (Signed)
Patient states she started leaking clear fluid at midnight, has continued to leak and has a pad on that is wet. Denies any bleeding and reports good fetal movement. States irregular contractions, has been on Procardia but has been stopped by MD>

## 2012-05-20 NOTE — MAU Note (Signed)
Patient denies any recent intercourse and states she stopped taking the Procardia because it made her feel bad.

## 2012-05-20 NOTE — Discharge Instructions (Signed)
Fetal Movement Counts Patient Name: __________________________________________________ Patient Due Date: ____________________ Kick counts is highly recommended in high risk pregnancies, but it is a good idea for every pregnant woman to do. Start counting fetal movements at 28 weeks of the pregnancy. Fetal movements increase after eating a full meal or eating or drinking something sweet (the blood sugar is higher). It is also important to drink plenty of fluids (well hydrated) before doing the count. Lie on your left side because it helps with the circulation or you can sit in a comfortable chair with your arms over your belly (abdomen) with no distractions around you. DOING THE COUNT  Try to do the count the same time of day each time you do it.   Mark the day and time, then see how long it takes for you to feel 10 movements (kicks, flutters, swishes, rolls). You should have at least 10 movements within 2 hours. You will most likely feel 10 movements in much less than 2 hours. If you do not, wait an hour and count again. After a couple of days you will see a pattern.   What you are looking for is a change in the pattern or not enough counts in 2 hours. Is it taking longer in time to reach 10 movements?  SEEK MEDICAL CARE IF:  You feel less than 10 counts in 2 hours. Tried twice.   No movement in one hour.   The pattern is changing or taking longer each day to reach 10 counts in 2 hours.   You feel the baby is not moving as it usually does.  Date: ____________ Movements: ____________ Start time: ____________ Finish time: ____________  Date: ____________ Movements: ____________ Start time: ____________ Finish time: ____________ Date: ____________ Movements: ____________ Start time: ____________ Finish time: ____________ Date: ____________ Movements: ____________ Start time: ____________ Finish time: ____________ Date: ____________ Movements: ____________ Start time: ____________ Finish time:  ____________ Date: ____________ Movements: ____________ Start time: ____________ Finish time: ____________ Date: ____________ Movements: ____________ Start time: ____________ Finish time: ____________ Date: ____________ Movements: ____________ Start time: ____________ Finish time: ____________  Date: ____________ Movements: ____________ Start time: ____________ Finish time: ____________ Date: ____________ Movements: ____________ Start time: ____________ Finish time: ____________ Date: ____________ Movements: ____________ Start time: ____________ Finish time: ____________ Date: ____________ Movements: ____________ Start time: ____________ Finish time: ____________ Date: ____________ Movements: ____________ Start time: ____________ Finish time: ____________ Date: ____________ Movements: ____________ Start time: ____________ Finish time: ____________ Date: ____________ Movements: ____________ Start time: ____________ Finish time: ____________  Date: ____________ Movements: ____________ Start time: ____________ Finish time: ____________ Date: ____________ Movements: ____________ Start time: ____________ Finish time: ____________ Date: ____________ Movements: ____________ Start time: ____________ Finish time: ____________ Date: ____________ Movements: ____________ Start time: ____________ Finish time: ____________ Date: ____________ Movements: ____________ Start time: ____________ Finish time: ____________ Date: ____________ Movements: ____________ Start time: ____________ Finish time: ____________ Date: ____________ Movements: ____________ Start time: ____________ Finish time: ____________  Date: ____________ Movements: ____________ Start time: ____________ Finish time: ____________ Date: ____________ Movements: ____________ Start time: ____________ Finish time: ____________ Date: ____________ Movements: ____________ Start time: ____________ Finish time: ____________ Date: ____________ Movements:  ____________ Start time: ____________ Finish time: ____________ Date: ____________ Movements: ____________ Start time: ____________ Finish time: ____________ Date: ____________ Movements: ____________ Start time: ____________ Finish time: ____________ Date: ____________ Movements: ____________ Start time: ____________ Finish time: ____________  Date: ____________ Movements: ____________ Start time: ____________ Finish time: ____________ Date: ____________ Movements: ____________ Start time: ____________ Finish time: ____________ Date: ____________ Movements: ____________ Start time:   ____________ Finish time: ____________ Date: ____________ Movements: ____________ Start time: ____________ Finish time: ____________ Date: ____________ Movements: ____________ Start time: ____________ Finish time: ____________ Date: ____________ Movements: ____________ Start time: ____________ Finish time: ____________ Date: ____________ Movements: ____________ Start time: ____________ Finish time: ____________  Date: ____________ Movements: ____________ Start time: ____________ Finish time: ____________ Date: ____________ Movements: ____________ Start time: ____________ Finish time: ____________ Date: ____________ Movements: ____________ Start time: ____________ Finish time: ____________ Date: ____________ Movements: ____________ Start time: ____________ Finish time: ____________ Date: ____________ Movements: ____________ Start time: ____________ Finish time: ____________ Date: ____________ Movements: ____________ Start time: ____________ Finish time: ____________ Date: ____________ Movements: ____________ Start time: ____________ Finish time: ____________  Date: ____________ Movements: ____________ Start time: ____________ Finish time: ____________ Date: ____________ Movements: ____________ Start time: ____________ Finish time: ____________ Date: ____________ Movements: ____________ Start time: ____________ Finish  time: ____________ Date: ____________ Movements: ____________ Start time: ____________ Finish time: ____________ Date: ____________ Movements: ____________ Start time: ____________ Finish time: ____________ Date: ____________ Movements: ____________ Start time: ____________ Finish time: ____________ Date: ____________ Movements: ____________ Start time: ____________ Finish time: ____________  Date: ____________ Movements: ____________ Start time: ____________ Finish time: ____________ Date: ____________ Movements: ____________ Start time: ____________ Finish time: ____________ Date: ____________ Movements: ____________ Start time: ____________ Finish time: ____________ Date: ____________ Movements: ____________ Start time: ____________ Finish time: ____________ Date: ____________ Movements: ____________ Start time: ____________ Finish time: ____________ Date: ____________ Movements: ____________ Start time: ____________ Finish time: ____________ Document Released: 10/06/2006 Document Revised: 08/26/2011 Document Reviewed: 04/08/2009 ExitCare Patient Information 2012 [employer].  Preventing Preterm Labor Preterm labor is when a pregnant woman has contractions that cause the cervix to open, shorten, and thin before 37 weeks of pregnancy. You will have regular contractions (tightening) 2 to 3 minutes apart. This usually causes discomfort or pain. HOME CARE  Eat a healthy diet.   Take your vitamins as told by your doctor.   Drink enough fluids to keep your pee (urine) clear or pale yellow every day.   Get rest and sleep.   Do not have sex if you are at high risk for preterm labor.   Follow your doctor's advice about activity, medicines, and tests.   Avoid stress.   Avoid hard labor or exercise that lasts for a long time.   Do not smoke.  GET HELP RIGHT AWAY IF:   You are having contractions.   You have belly (abdominal) pain.   You have bleeding from your vagina.   You  have pain when you pee (urinate).   You have abnormal discharge from your vagina.   You have a temperature by mouth above 102 F (38.9 C).  MAKE SURE YOU:  Understand these instructions.   Will watch your condition.   Will get help if you are not doing well or get worse.  Document Released: 12/03/2008 Document Revised: 08/26/2011 Document Reviewed: 12/03/2008 ExitCare Patient Information 2012 [employer]. 

## 2012-06-06 ENCOUNTER — Encounter (HOSPITAL_COMMUNITY): Payer: Self-pay

## 2012-06-06 ENCOUNTER — Inpatient Hospital Stay (HOSPITAL_COMMUNITY)
Admission: AD | Admit: 2012-06-06 | Discharge: 2012-06-06 | Disposition: A | Discharge status: Home / Self Care | Payer: Medicaid Other | Source: Ambulatory Visit | Attending: Obstetrics and Gynecology | Admitting: Obstetrics and Gynecology

## 2012-06-06 DIAGNOSIS — F411 Generalized anxiety disorder: Secondary | ICD-10-CM

## 2012-06-06 DIAGNOSIS — F419 Anxiety disorder, unspecified: Secondary | ICD-10-CM

## 2012-06-06 DIAGNOSIS — O4700 False labor before 37 completed weeks of gestation, unspecified trimester: Secondary | ICD-10-CM | POA: Insufficient documentation

## 2012-06-06 DIAGNOSIS — O479 False labor, unspecified: Secondary | ICD-10-CM

## 2012-06-06 LAB — URINALYSIS, ROUTINE W REFLEX MICROSCOPIC
Bilirubin Urine: NEGATIVE
Ketones, ur: NEGATIVE mg/dL
Protein, ur: NEGATIVE mg/dL
Specific Gravity, Urine: 1.02 (ref 1.005–1.030)
Urobilinogen, UA: 0.2 mg/dL (ref 0.0–1.0)

## 2012-06-06 LAB — URINE MICROSCOPIC-ADD ON

## 2012-06-06 MED ORDER — HYDROXYZINE HCL 25 MG PO TABS
25.0000 mg | ORAL_TABLET | ORAL | Status: AC
  Administered 2012-06-06: 25 mg via ORAL
  Filled 2012-06-06: qty 1

## 2012-06-06 MED ORDER — HYDROXYZINE PAMOATE 25 MG PO CAPS: ORAL_CAPSULE | ORAL | Status: DC

## 2012-06-06 MED ORDER — HYDROXYZINE HCL 50 MG/ML IM SOLN: 25.0000 mg | INTRAMUSCULAR | Status: DC

## 2012-06-06 NOTE — MAU Provider Note (Signed)
Chief Complaint:  Labor Eval   None     HPI: Pamela Bailey is a 31 y.o. G2P1001 at 28w3dwho presents to maternity admissions reporting anxiety attack and contractions 5-6x/hour.  She has had a continuous clear fluid leakage for several days also which was evaluated in the office.  She reports she has had these contractions off and on for weeks and they are unchanged. She does report nausea and vomiting today.  She reports good fetal movement, vaginal bleeding, vaginal itching/burning, urinary symptoms, h/a, dizziness, or fever/chills.      Past Medical History: Past Medical History  Diagnosis Date  . Asthma   . Headache   . Anxiety   . Ovarian cyst   . Endometriosis   . Cleft lip   . Cleft hard palate   . PONV (postoperative nausea and vomiting)   . Depression   . Abnormal Pap smear     colpo 5/09     Past Surgical History: Past Surgical History  Procedure Date  . Cosmetic surgery   . Tympanostomy tube placement   . Abdominal surgery   . Laparoscopy   . Salpingectomy   . No past surgeries     Family History: Family History  Problem Relation Age of Onset  . Hypertension Maternal Grandfather   . Anesthesia problems Neg Hx     Social History: History  Substance Use Topics  . Smoking status: Current Every Day Smoker -- 0.5 packs/day for 5 years    Types: Cigarettes  . Smokeless tobacco: Never Used  . Alcohol Use: No    Allergies:  Allergies  Allergen Reactions  . Vancomycin Other (See Comments)    Acute kidney injury   . Amoxicillin-Pot Clavulanate Nausea And Vomiting and Rash    Meds:  Prescriptions prior to admission  Medication Sig Dispense Refill  . acetaminophen (TYLENOL) 500 MG tablet Take 500 mg by mouth every 6 (six) hours as needed. For pain      . Prenatal Vit-Fe Fumarate-FA (PRENATAL MULTIVITAMIN) TABS Take 1 tablet by mouth every morning.         ROS: Pertinent findings in history of present illness.  Physical Exam  Blood pressure  117/80, pulse 111, temperature 97.9 F (36.6 C), temperature source Oral, resp. rate 16, height 5\' 3"  (1.6 m), weight 49.079 kg (108 lb 3.2 oz), last menstrual period 09/22/2011, SpO2 100.00%. GENERAL: Well-developed, well-nourished female in no acute distress.  HEENT: normocephalic HEART: normal rate RESP: normal effort ABDOMEN: Soft, non-tender, gravid appropriate for gestational age EXTREMITIES: Nontender, no edema NEURO: alert and oriented Dilation: 2.5 Effacement (%): 60 Cervical Position: Middle Exam by:: Sharen Counter CNM    FHT:  Baseline 135, moderate variability, accelerations present, no decelerations Contractions: irritability, lasting 30 seconds, q 2-3 mins    Assessment: 1. Anxiety   2. Braxton Hicks contractions     Plan:  Vistaril IM per Dr Rana Snare, since Vistaril injection unavailable per pharmacy, PO 25 mg--anxiety relieved with medication Discharge home Labor precautions and fetal kick counts F/U with prenatal provider Return to MAU as needed    Medication List     As of 06/06/2012  1:13 PM    ASK your doctor about these medications         acetaminophen 500 MG tablet   Commonly known as: TYLENOL   Take 500 mg by mouth every 6 (six) hours as needed. For pain      prenatal multivitamin Tabs   Take 1 tablet by  mouth every morning.        Sharen Counter Certified Nurse-Midwife 06/06/2012 1:13 PM

## 2012-06-06 NOTE — MAU Note (Signed)
Patient states she is having contractions 5-6 per hour. Has been having a watery discharge for a while. Has had the discharge evaluation in the office. Reports good fetal movement.

## 2012-06-06 NOTE — MAU Note (Signed)
Patient states that her anxiety has improved with po Vistaril.

## 2012-06-09 ENCOUNTER — Other Ambulatory Visit (HOSPITAL_COMMUNITY): Payer: Self-pay | Admitting: Maternal and Fetal Medicine

## 2012-06-09 ENCOUNTER — Ambulatory Visit (HOSPITAL_COMMUNITY)
Admission: RE | Admit: 2012-06-09 | Discharge: 2012-06-09 | Disposition: A | Discharge status: Home / Self Care | Payer: Medicaid Other | Source: Ambulatory Visit | Attending: Obstetrics and Gynecology | Admitting: Obstetrics and Gynecology

## 2012-06-09 DIAGNOSIS — O99330 Smoking (tobacco) complicating pregnancy, unspecified trimester: Secondary | ICD-10-CM | POA: Insufficient documentation

## 2012-06-09 DIAGNOSIS — O358XX0 Maternal care for other (suspected) fetal abnormality and damage, not applicable or unspecified: Secondary | ICD-10-CM

## 2012-06-09 DIAGNOSIS — IMO0002 Reserved for concepts with insufficient information to code with codable children: Secondary | ICD-10-CM

## 2012-06-09 NOTE — Progress Notes (Signed)
Ms. Barlett had an ultrasound appointment today.  Please see [facility]-OB/GYN report for details.  Impression IUP at 32+6 weeks Isolated bilateral cleft lip and palate All other interval fetal anatomy was seen and appeared normal noting that the fetal humerus and femur remain small for gestational age. Normal amniotic fluid volume.  The EFW today is at 18th %tile; the [facility] measures at the 7th %tile Normal umbilical artery Doppler studies without evidence of absent or reversed diastolic flow   BPP 8/10 (-2 for lack of sustained breathing movement)  Patient has been seen by Plastic surgery at [facility]   Recommendations 1. Recommend 2x weekly NSTs and weekly AFI's 2. Recommend Doppler of UA in 2 weeks; 3. Follow up growth scan in 4 weeks.  Rogelia Boga, MD, MS, FACOG Assistant Professor Section of Maternal-Fetal Medicine [facility]

## 2012-06-10 ENCOUNTER — Inpatient Hospital Stay (HOSPITAL_COMMUNITY)
Admission: AD | Admit: 2012-06-10 | Discharge: 2012-06-10 | Disposition: A | Discharge status: Home / Self Care | Payer: Medicaid Other | Source: Ambulatory Visit | Attending: Obstetrics and Gynecology | Admitting: Obstetrics and Gynecology

## 2012-06-10 ENCOUNTER — Encounter (HOSPITAL_COMMUNITY): Payer: Self-pay | Admitting: Obstetrics and Gynecology

## 2012-06-10 DIAGNOSIS — O4700 False labor before 37 completed weeks of gestation, unspecified trimester: Secondary | ICD-10-CM | POA: Insufficient documentation

## 2012-06-10 LAB — URINALYSIS, ROUTINE W REFLEX MICROSCOPIC
Specific Gravity, Urine: <1.005 — ABNORMAL LOW (ref 1.005–1.030)
Urobilinogen, UA: 0.2 mg/dL (ref 0.0–1.0)
pH: 6 (ref 5.0–8.0)

## 2012-06-10 LAB — URINE MICROSCOPIC-ADD ON

## 2012-06-10 NOTE — MAU Note (Addendum)
Pt reports having ctx q 4-5 min. Denies SROM or bleeding  Reports good fetal movement and having "back labor" as well. Pt has been on bedrest since 26 weeks for PTL for open cervix

## 2012-06-10 NOTE — Progress Notes (Signed)
Dr. Marcelle Overlie notified of patient arrival for labor eval. Orders received for patient to walk for 1 hour and re-check cervix.

## 2012-06-10 NOTE — Progress Notes (Signed)
Dr. Marcelle Overlie notified of cervical exam- unchanged. 2.5, 50, -2, Blood pressure normal, fetal strip reactive.

## 2012-06-13 ENCOUNTER — Inpatient Hospital Stay (HOSPITAL_COMMUNITY)
Admission: AD | Admit: 2012-06-13 | Discharge: 2012-06-14 | Disposition: A | Discharge status: Home / Self Care | Payer: Medicaid Other | Source: Ambulatory Visit | Attending: Obstetrics and Gynecology | Admitting: Obstetrics and Gynecology

## 2012-06-13 DIAGNOSIS — O4700 False labor before 37 completed weeks of gestation, unspecified trimester: Secondary | ICD-10-CM | POA: Insufficient documentation

## 2012-06-14 ENCOUNTER — Encounter (HOSPITAL_COMMUNITY): Payer: Self-pay | Admitting: *Deleted

## 2012-06-14 MED ORDER — DIPHENHYDRAMINE HCL 25 MG PO CAPS
25.0000 mg | ORAL_CAPSULE | Freq: Once | ORAL | Status: AC
  Administered 2012-06-14: 25 mg via ORAL
  Filled 2012-06-14: qty 1

## 2012-06-14 NOTE — MAU Note (Signed)
Pt G2 P1 at 36.4wks having contractions every seen in the office today SVE 4cm.  Denies bleeding.  GBS pending

## 2012-06-14 NOTE — MAU Note (Signed)
PT SAYS SHE WAS IN OFFICE TODAY-     DR LOWE - 4 CM .   DENIES HSV AND MRSA.

## 2012-06-19 ENCOUNTER — Encounter (HOSPITAL_COMMUNITY): Payer: Self-pay | Admitting: *Deleted

## 2012-06-19 ENCOUNTER — Inpatient Hospital Stay (HOSPITAL_COMMUNITY)
Admission: AD | Admit: 2012-06-19 | Discharge: 2012-06-19 | Disposition: A | Discharge status: Home / Self Care | Payer: Medicaid Other | Source: Ambulatory Visit | Attending: Obstetrics & Gynecology | Admitting: Obstetrics & Gynecology

## 2012-06-19 DIAGNOSIS — O479 False labor, unspecified: Secondary | ICD-10-CM | POA: Insufficient documentation

## 2012-06-21 ENCOUNTER — Inpatient Hospital Stay (HOSPITAL_COMMUNITY)
Admission: AD | Admit: 2012-06-21 | Discharge: 2012-06-24 | DRG: 775 | Disposition: A | Discharge status: Home / Self Care | Payer: Medicaid Other | Source: Ambulatory Visit | Attending: Obstetrics and Gynecology | Admitting: Obstetrics and Gynecology

## 2012-06-21 ENCOUNTER — Encounter (HOSPITAL_COMMUNITY): Payer: Self-pay | Admitting: *Deleted

## 2012-06-21 ENCOUNTER — Encounter (HOSPITAL_COMMUNITY): Payer: Self-pay | Admitting: Anesthesiology

## 2012-06-21 ENCOUNTER — Inpatient Hospital Stay (HOSPITAL_COMMUNITY): Payer: Medicaid Other | Admitting: Anesthesiology

## 2012-06-21 DIAGNOSIS — O358XX0 Maternal care for other (suspected) fetal abnormality and damage, not applicable or unspecified: Secondary | ICD-10-CM | POA: Diagnosis present

## 2012-06-21 DIAGNOSIS — R109 Unspecified abdominal pain: Secondary | ICD-10-CM

## 2012-06-21 DIAGNOSIS — R51 Headache: Secondary | ICD-10-CM

## 2012-06-21 DIAGNOSIS — N179 Acute kidney failure, unspecified: Secondary | ICD-10-CM

## 2012-06-21 DIAGNOSIS — R110 Nausea: Secondary | ICD-10-CM

## 2012-06-21 LAB — CBC
HCT: 37.6 % (ref 36.0–46.0)
Hemoglobin: 13 g/dL (ref 12.0–15.0)
MCH: 32.3 pg (ref 26.0–34.0)
MCHC: 34.6 g/dL (ref 30.0–36.0)

## 2012-06-21 LAB — OB RESULTS CONSOLE GBS: GBS: NEGATIVE

## 2012-06-21 MED ORDER — EPHEDRINE 5 MG/ML INJ: 10.0000 mg | INTRAVENOUS | Status: DC | PRN

## 2012-06-21 MED ORDER — ACETAMINOPHEN 325 MG PO TABS: 650.0000 mg | ORAL_TABLET | ORAL | Status: DC | PRN

## 2012-06-21 MED ORDER — ONDANSETRON HCL 4 MG/2ML IJ SOLN
4.0000 mg | Freq: Four times a day (QID) | INTRAMUSCULAR | Status: DC | PRN
  Administered 2012-06-21 – 2012-06-22 (×2): 4 mg via INTRAVENOUS
  Filled 2012-06-21 (×2): qty 2

## 2012-06-21 MED ORDER — LACTATED RINGERS IV SOLN: 500.0000 mL | Freq: Once | INTRAVENOUS | Status: DC

## 2012-06-21 MED ORDER — PHENYLEPHRINE 40 MCG/ML (10ML) SYRINGE FOR IV PUSH (FOR BLOOD PRESSURE SUPPORT): 80.0000 ug | PREFILLED_SYRINGE | INTRAVENOUS | Status: DC | PRN

## 2012-06-21 MED ORDER — CITRIC ACID-SODIUM CITRATE 334-500 MG/5ML PO SOLN: 30.0000 mL | ORAL | Status: DC | PRN

## 2012-06-21 MED ORDER — FENTANYL 2.5 MCG/ML BUPIVACAINE 1/10 % EPIDURAL INFUSION (WH - ANES): 14.0000 mL/h | INTRAMUSCULAR | Status: DC

## 2012-06-21 MED ORDER — SODIUM CHLORIDE 0.9 % IJ SOLN
3.0000 mL | INTRAMUSCULAR | Status: DC | PRN
  Administered 2012-06-21 – 2012-06-22 (×3): 3 mL via INTRAVENOUS

## 2012-06-21 MED ORDER — LIDOCAINE HCL (PF) 1 % IJ SOLN
INTRAMUSCULAR | Status: DC | PRN
  Administered 2012-06-21 (×2): 4 mL

## 2012-06-21 MED ORDER — LIDOCAINE HCL (PF) 1 % IJ SOLN
30.0000 mL | INTRAMUSCULAR | Status: DC | PRN
  Filled 2012-06-21: qty 30

## 2012-06-21 MED ORDER — PHENYLEPHRINE 40 MCG/ML (10ML) SYRINGE FOR IV PUSH (FOR BLOOD PRESSURE SUPPORT)
80.0000 ug | PREFILLED_SYRINGE | INTRAVENOUS | Status: DC | PRN
  Filled 2012-06-21: qty 5

## 2012-06-21 MED ORDER — FENTANYL 2.5 MCG/ML BUPIVACAINE 1/10 % EPIDURAL INFUSION (WH - ANES)
14.0000 mL/h | INTRAMUSCULAR | Status: DC
  Administered 2012-06-22: 14 mL/h via EPIDURAL
  Filled 2012-06-21 (×2): qty 123

## 2012-06-21 MED ORDER — EPHEDRINE 5 MG/ML INJ
10.0000 mg | INTRAVENOUS | Status: DC | PRN
  Filled 2012-06-21: qty 4

## 2012-06-21 MED ORDER — DIPHENHYDRAMINE HCL 50 MG/ML IJ SOLN: 12.5000 mg | INTRAMUSCULAR | Status: DC | PRN

## 2012-06-21 MED ORDER — OXYTOCIN BOLUS FROM INFUSION
500.0000 mL | Freq: Once | INTRAVENOUS | Status: AC
  Administered 2012-06-22: 500 mL via INTRAVENOUS
  Filled 2012-06-21: qty 500

## 2012-06-21 MED ORDER — LACTATED RINGERS IV SOLN: 500.0000 mL | INTRAVENOUS | Status: DC | PRN

## 2012-06-21 MED ORDER — IBUPROFEN 600 MG PO TABS
600.0000 mg | ORAL_TABLET | Freq: Four times a day (QID) | ORAL | Status: DC | PRN
  Filled 2012-06-21: qty 1

## 2012-06-21 MED ORDER — LACTATED RINGERS IV SOLN
INTRAVENOUS | Status: DC
  Administered 2012-06-21 – 2012-06-22 (×3): via INTRAVENOUS

## 2012-06-21 MED ORDER — OXYTOCIN 40 UNITS IN LACTATED RINGERS INFUSION - SIMPLE MED
62.5000 mL/h | Freq: Once | INTRAVENOUS | Status: DC
  Filled 2012-06-21: qty 1000

## 2012-06-21 MED ORDER — FENTANYL 2.5 MCG/ML BUPIVACAINE 1/10 % EPIDURAL INFUSION (WH - ANES)
INTRAMUSCULAR | Status: DC | PRN
  Administered 2012-06-21: 14 mL/h via EPIDURAL

## 2012-06-21 MED ORDER — OXYCODONE-ACETAMINOPHEN 5-325 MG PO TABS: 1.0000 | ORAL_TABLET | ORAL | Status: DC | PRN

## 2012-06-21 NOTE — Anesthesia Procedure Notes (Signed)
Epidural Patient location during procedure: OB Start time: 06/21/2012 9:19 PM  Staffing Anesthesiologist: Darnita Woodrum A. Performed by: anesthesiologist   Preanesthetic Checklist Completed: patient identified, site marked, surgical consent, pre-op evaluation, timeout performed, IV checked, risks and benefits discussed and monitors and equipment checked  Epidural Patient position: sitting Prep: site prepped and draped and DuraPrep Patient monitoring: continuous pulse ox and blood pressure Approach: midline Injection technique: LOR air  Needle:  Needle type: Tuohy  Needle gauge: 17 G Needle length: 9 cm and 9 Needle insertion depth: 5 cm cm Catheter type: closed end flexible Catheter size: 19 Gauge Catheter at skin depth: 10 cm Test dose: negative and Other  Assessment Events: blood not aspirated, injection not painful, no injection resistance, negative IV test and no paresthesia  Additional Notes Patient identified. Risks and benefits discussed including failed block, incomplete  Pain control, post dural puncture headache, nerve damage, paralysis, blood pressure Changes, nausea, vomiting, reactions to medications-both toxic and allergic and post Partum back pain. All questions were answered. Patient expressed understanding and wished to proceed. Sterile technique was used throughout procedure. Epidural site was Dressed with sterile barrier dressing. No paresthesias, signs of intravascular injection Or signs of intrathecal spread were encountered.  Patient was more comfortable after the epidural was dosed. Please see RN's note for documentation of vital signs and FHR which are stable.

## 2012-06-21 NOTE — Anesthesia Preprocedure Evaluation (Signed)
Anesthesia Evaluation    History of Anesthesia Complications (+) PONV  Airway Mallampati: III TM Distance: >3 FB Neck ROM: Full    Dental No notable dental hx. (+) Teeth Intact   Pulmonary asthma , Current Smoker,          Cardiovascular negative cardio ROS  Rhythm:Regular Rate:Normal     Neuro/Psych  Headaches, Anxiety Depression    GI/Hepatic negative GI ROS, Neg liver ROS,   Endo/Other  negative endocrine ROS  Renal/GU ARFRenal diseaseHx/o ARF post op due to abx- resolved 2009  negative genitourinary   Musculoskeletal negative musculoskeletal ROS (+)   Abdominal (+) + obese,   Peds  Hematology negative hematology ROS (+)   Anesthesia Other Findings right facial droop S/P Cleft palate repair  Reproductive/Obstetrics (+) Pregnancy                           Anesthesia Physical Anesthesia Plan  ASA: II  Anesthesia Plan: Epidural   Post-op Pain Management:    Induction:   Airway Management Planned: Natural Airway  Additional Equipment:   Intra-op Plan:   Post-operative Plan:   Informed Consent: I have reviewed the patients History and Physical, chart, labs and discussed the procedure including the risks, benefits and alternatives for the proposed anesthesia with the patient or authorized representative who has indicated his/her understanding and acceptance.   Dental advisory given  Plan Discussed with: CRNA, Anesthesiologist and Surgeon  Anesthesia Plan Comments:         Anesthesia Quick Evaluation

## 2012-06-21 NOTE — H&P (Signed)
Pamela Bailey is a 31 y.o. female presenting for C/O UCs. In office cx =4cm and NST had UCs of q2-4 min with cervical change. No ROM/Bleeding. Pregnancy complicated by bilateral cleft lip/palete in baby. Maternal Medical History:  Reason for admission: Reason for admission: contractions.  Contractions: Onset was 6-12 hours ago.    Fetal activity: Perceived fetal activity is normal.      OB History    Grav Para Term Preterm Abortions TAB SAB Ect Mult Living   2 1 1       1      Past Medical History  Diagnosis Date  . Asthma   . Headache   . Anxiety   . Ovarian cyst   . Endometriosis   . Cleft lip   . Cleft hard palate   . PONV (postoperative nausea and vomiting)   . Depression   . Abnormal Pap smear     colpo 5/09   Past Surgical History  Procedure Date  . Cosmetic surgery   . Tympanostomy tube placement   . Abdominal surgery   . Laparoscopy   . Salpingectomy   . No past surgeries    Family History: family history includes Hypertension in her maternal grandfather.  There is no history of Anesthesia problems. Social History:  reports that she has been smoking Cigarettes.  She has a 2.5 pack-year smoking history. She has never used smokeless tobacco. She reports that she does not drink alcohol or use illicit drugs.   Prenatal Transfer Tool  Maternal Diabetes: No Genetic Screening: Normal Maternal Ultrasounds/Referrals: Abnormal:  Findings:   Other: Fetal Ultrasounds or other Referrals:  Referred to Materal Fetal Medicine  Maternal Substance Abuse:  No Significant Maternal Medications:  None Significant Maternal Lab Results:  None Other Comments:  bilateral cleft lip/palate  Review of Systems  Eyes: Negative for blurred vision.  Gastrointestinal: Negative for abdominal pain.  Neurological: Negative for headaches.    Dilation: 5 Effacement (%): 90 Station: -1 Exam by:: Dr Henderson Cloud Blood pressure 113/72, pulse 126, temperature 98.4 F (36.9 C), temperature  source Oral, resp. rate 18, height 5\' 2"  (1.575 m), weight 49.896 kg (110 lb), last menstrual period 09/22/2011. Maternal Exam:  Abdomen: Fetal presentation: vertex     Fetal Exam Fetal Monitor Review: Pattern: accelerations present.       Physical Exam  Cardiovascular: Normal rate and regular rhythm.   Respiratory: Effort normal and breath sounds normal.  Neurological: She has normal reflexes.   AROM clear Prenatal labs: ABO, Rh: O/Positive/-- (02/25 0000) Antibody: Negative (02/25 0000) Rubella: Immune (02/25 0000) RPR: Nonreactive (02/25 0000)  HBsAg: Negative (02/25 0000)  HIV: Non-reactive (02/25 0000)  GBS: Negative (10/02 1802)   Assessment/Plan: 31 yo G2P1 at 37 4/7 weeks in labor  Daziya Redmond II,Ines Rebel E 06/21/2012, 8:54 PM

## 2012-06-21 NOTE — Progress Notes (Signed)
UCs had spaced out after IV fluids Patient walked UCs feeling stronger and more pelvic pressure  Cx 5/90/-1 AROM clear  FHT reactive

## 2012-06-22 ENCOUNTER — Encounter (HOSPITAL_COMMUNITY): Payer: Self-pay | Admitting: *Deleted

## 2012-06-22 MED ORDER — PRENATAL MULTIVITAMIN CH
1.0000 | ORAL_TABLET | Freq: Every day | ORAL | Status: DC
  Administered 2012-06-23 – 2012-06-24 (×2): 1 via ORAL
  Filled 2012-06-22 (×2): qty 1

## 2012-06-22 MED ORDER — OXYCODONE-ACETAMINOPHEN 5-325 MG PO TABS
1.0000 | ORAL_TABLET | ORAL | Status: DC | PRN
  Administered 2012-06-22 – 2012-06-23 (×5): 1 via ORAL
  Filled 2012-06-22 (×5): qty 1

## 2012-06-22 MED ORDER — ONDANSETRON HCL 4 MG PO TABS: 4.0000 mg | ORAL_TABLET | ORAL | Status: DC | PRN

## 2012-06-22 MED ORDER — IBUPROFEN 600 MG PO TABS
600.0000 mg | ORAL_TABLET | Freq: Four times a day (QID) | ORAL | Status: DC
  Administered 2012-06-22 – 2012-06-24 (×7): 600 mg via ORAL
  Filled 2012-06-22 (×6): qty 1

## 2012-06-22 MED ORDER — SENNOSIDES-DOCUSATE SODIUM 8.6-50 MG PO TABS
2.0000 | ORAL_TABLET | Freq: Every day | ORAL | Status: DC
  Administered 2012-06-22 – 2012-06-23 (×2): 2 via ORAL

## 2012-06-22 MED ORDER — ZOLPIDEM TARTRATE 5 MG PO TABS: 5.0000 mg | ORAL_TABLET | Freq: Every evening | ORAL | Status: DC | PRN

## 2012-06-22 MED ORDER — DIBUCAINE 1 % RE OINT: 1.0000 "application " | TOPICAL_OINTMENT | RECTAL | Status: DC | PRN

## 2012-06-22 MED ORDER — SIMETHICONE 80 MG PO CHEW: 80.0000 mg | CHEWABLE_TABLET | ORAL | Status: DC | PRN

## 2012-06-22 MED ORDER — DIPHENHYDRAMINE HCL 25 MG PO CAPS: 25.0000 mg | ORAL_CAPSULE | Freq: Four times a day (QID) | ORAL | Status: DC | PRN

## 2012-06-22 MED ORDER — BISACODYL 10 MG RE SUPP: 10.0000 mg | Freq: Every day | RECTAL | Status: DC | PRN

## 2012-06-22 MED ORDER — BENZOCAINE-MENTHOL 20-0.5 % EX AERO
1.0000 "application " | INHALATION_SPRAY | CUTANEOUS | Status: DC | PRN
  Administered 2012-06-22: 1 via TOPICAL
  Filled 2012-06-22 (×2): qty 56

## 2012-06-22 MED ORDER — FLEET ENEMA 7-19 GM/118ML RE ENEM: 1.0000 | ENEMA | Freq: Every day | RECTAL | Status: DC | PRN

## 2012-06-22 MED ORDER — OXYTOCIN 40 UNITS IN LACTATED RINGERS INFUSION - SIMPLE MED
1.0000 m[IU]/min | INTRAVENOUS | Status: DC
  Administered 2012-06-22: 1 m[IU]/min via INTRAVENOUS

## 2012-06-22 MED ORDER — LANOLIN HYDROUS EX OINT: TOPICAL_OINTMENT | CUTANEOUS | Status: DC | PRN

## 2012-06-22 MED ORDER — TETANUS-DIPHTH-ACELL PERTUSSIS 5-2.5-18.5 LF-MCG/0.5 IM SUSP: 0.5000 mL | Freq: Once | INTRAMUSCULAR | Status: DC

## 2012-06-22 MED ORDER — TERBUTALINE SULFATE 1 MG/ML IJ SOLN: 0.2500 mg | Freq: Once | INTRAMUSCULAR | Status: DC | PRN

## 2012-06-22 MED ORDER — WITCH HAZEL-GLYCERIN EX PADS: 1.0000 "application " | MEDICATED_PAD | CUTANEOUS | Status: DC | PRN

## 2012-06-22 MED ORDER — ONDANSETRON HCL 4 MG/2ML IJ SOLN: 4.0000 mg | INTRAMUSCULAR | Status: DC | PRN

## 2012-06-22 NOTE — Progress Notes (Signed)
Delivery Note Good progress in second stage, FHT reactive SVD VMI Apgars per neonatology, weight and art pH pending Neonatology team at delivery Bilateral cleft palate/lip Placenta 3 vessels, intact to path EBL 350cc Cx/vagina intact Second degree ML lac repaired Patient / infant stable in LDR

## 2012-06-23 LAB — CBC
HCT: 36.4 % (ref 36.0–46.0)
MCH: 32.5 pg (ref 26.0–34.0)
MCHC: 34.1 g/dL (ref 30.0–36.0)
RDW: 12.9 % (ref 11.5–15.5)

## 2012-06-23 NOTE — Progress Notes (Signed)
UR Chart review completed.  

## 2012-06-23 NOTE — Progress Notes (Signed)
Post Partum Day 1 Subjective: no complaints  Objective: Blood pressure 119/79, pulse 68, temperature 97.3 F (36.3 C), temperature source Oral, resp. rate 18, height 5\' 2"  (1.575 m), weight 49.896 kg (110 lb), last menstrual period 09/22/2011, SpO2 98.00%, unknown if currently breastfeeding.  Physical Exam:  General: alert, cooperative and appears older than stated age Lochia: appropriate Uterine Fundus: firm Incision: healing well, no significant drainage, no dehiscence DVT Evaluation: No evidence of DVT seen on physical exam.   Basename 06/23/12 0600 06/21/12 1730  HGB 12.4 13.0  HCT 36.4 37.6    Assessment/Plan: Plan for discharge tomorrow   LOS: 2 days   Kora Groom L 06/23/2012, 8:45 AM

## 2012-06-23 NOTE — Anesthesia Postprocedure Evaluation (Signed)
  Anesthesia Post-op Note  Patient: Pamela Bailey  Procedure(s) Performed: * No procedures listed *  Patient Location: Mother/Baby  Anesthesia Type: Epidural  Level of Consciousness: awake, alert  and oriented  Airway and Oxygen Therapy: Patient Spontanous Breathing  Post-op Pain: mild  Post-op Assessment: Patient's Cardiovascular Status Stable and Respiratory Function Stable  Post-op Vital Signs: Reviewed and stable  Complications: No apparent anesthesia complications

## 2012-06-24 MED ORDER — IBUPROFEN 600 MG PO TABS: 600.0000 mg | ORAL_TABLET | Freq: Four times a day (QID) | ORAL | Status: DC

## 2012-06-24 MED ORDER — OXYCODONE-ACETAMINOPHEN 5-325 MG PO TABS: 1.0000 | ORAL_TABLET | ORAL | Status: DC | PRN

## 2012-06-24 NOTE — Clinical Social Work Note (Signed)
CSW spoke with MOB briefly.  Hx of anxiety was several years ago.  No current concerns.  Patient was referred for history of depression/anxiety.  * Referral screened out by Clinical Social Worker because none of the following criteria appear to apply: ~ History of anxiety/depression during this pregnancy, or of post-partum depression. ~ Diagnosis of anxiety and/or depression within last 3 years ~ History of depression due to pregnancy loss/loss of child  OR  * Patient's symptoms currently being treated with medication and/or therapy.  Please contact the Clinical Social Worker if needs arise, or by the patient's request.  

## 2012-06-24 NOTE — Progress Notes (Signed)
Post Partum Day 2 Subjective: no complaints  Objective: Blood pressure 108/72, pulse 78, temperature 97.7 F (36.5 C), temperature source Oral, resp. rate 18, height 5\' 2"  (1.575 m), weight 49.896 kg (110 lb), last menstrual period 09/22/2011, SpO2 100.00%, unknown if currently breastfeeding.  Physical Exam:  General: alert, cooperative and appears stated age Lochia: appropriate Uterine Fundus: firm Incision: healing well, no significant drainage, no dehiscence, no significant erythema DVT Evaluation: No evidence of DVT seen on physical exam.   Basename 06/23/12 0600 06/21/12 1730  HGB 12.4 13.0  HCT 36.4 37.6    Assessment/Plan: Discharge home   LOS: 3 days   Pamela Bailey L 06/24/2012, 7:05 AM

## 2012-06-24 NOTE — Discharge Summary (Signed)
Obstetric Discharge Summary Reason for Admission: onset of labor Prenatal Procedures: none Intrapartum Procedures: spontaneous vaginal delivery Postpartum Procedures: none Complications-Operative and Postpartum: 2nd degree perineal laceration Hemoglobin  Date Value Range Status  06/23/2012 12.4  12.0 - 15.0 g/dL Final     HCT  Date Value Range Status  06/23/2012 36.4  36.0 - 46.0 % Final    Physical Exam:  General: alert and cooperative Lochia: appropriate Uterine Fundus: firm Incision: healing well DVT Evaluation: No evidence of DVT seen on physical exam.  Discharge Diagnoses: Term Pregnancy-delivered  Discharge Information: Date: 06/24/2012 Activity: pelvic rest Diet: routine Medications: Ibuprofen and Percocet Condition: improved Instructions: refer to practice specific booklet Discharge to: home   Newborn Data: Live born female  Birth Weight: 6 lb 2.4 oz (2790 g) APGAR: 7, 9  Home with mother.  Pamela Bailey 06/24/2012, 7:06 AM

## 2012-07-03 ENCOUNTER — Inpatient Hospital Stay (HOSPITAL_COMMUNITY): Admission: RE | Admit: 2012-07-03 | Payer: Medicaid Other | Source: Ambulatory Visit

## 2012-07-23 ENCOUNTER — Emergency Department (HOSPITAL_BASED_OUTPATIENT_CLINIC_OR_DEPARTMENT_OTHER): Payer: Medicaid Other

## 2012-07-23 ENCOUNTER — Emergency Department (HOSPITAL_BASED_OUTPATIENT_CLINIC_OR_DEPARTMENT_OTHER)
Admission: EM | Admit: 2012-07-23 | Discharge: 2012-07-24 | Disposition: A | Discharge status: Home / Self Care | Payer: Medicaid Other | Attending: Emergency Medicine | Admitting: Emergency Medicine

## 2012-07-23 ENCOUNTER — Encounter (HOSPITAL_BASED_OUTPATIENT_CLINIC_OR_DEPARTMENT_OTHER): Payer: Self-pay | Admitting: *Deleted

## 2012-07-23 DIAGNOSIS — N390 Urinary tract infection, site not specified: Secondary | ICD-10-CM | POA: Insufficient documentation

## 2012-07-23 DIAGNOSIS — F3289 Other specified depressive episodes: Secondary | ICD-10-CM | POA: Insufficient documentation

## 2012-07-23 DIAGNOSIS — J40 Bronchitis, not specified as acute or chronic: Secondary | ICD-10-CM | POA: Insufficient documentation

## 2012-07-23 DIAGNOSIS — R059 Cough, unspecified: Secondary | ICD-10-CM | POA: Insufficient documentation

## 2012-07-23 DIAGNOSIS — F17200 Nicotine dependence, unspecified, uncomplicated: Secondary | ICD-10-CM | POA: Insufficient documentation

## 2012-07-23 DIAGNOSIS — R05 Cough: Secondary | ICD-10-CM | POA: Insufficient documentation

## 2012-07-23 DIAGNOSIS — B373 Candidiasis of vulva and vagina: Secondary | ICD-10-CM

## 2012-07-23 DIAGNOSIS — Z79899 Other long term (current) drug therapy: Secondary | ICD-10-CM | POA: Insufficient documentation

## 2012-07-23 DIAGNOSIS — J45909 Unspecified asthma, uncomplicated: Secondary | ICD-10-CM | POA: Insufficient documentation

## 2012-07-23 DIAGNOSIS — R0602 Shortness of breath: Secondary | ICD-10-CM | POA: Insufficient documentation

## 2012-07-23 DIAGNOSIS — Z87720 Personal history of (corrected) congenital malformations of eye: Secondary | ICD-10-CM | POA: Insufficient documentation

## 2012-07-23 DIAGNOSIS — F411 Generalized anxiety disorder: Secondary | ICD-10-CM | POA: Insufficient documentation

## 2012-07-23 DIAGNOSIS — O2390 Unspecified genitourinary tract infection in pregnancy, unspecified trimester: Secondary | ICD-10-CM | POA: Insufficient documentation

## 2012-07-23 DIAGNOSIS — B3731 Acute candidiasis of vulva and vagina: Secondary | ICD-10-CM | POA: Insufficient documentation

## 2012-07-23 DIAGNOSIS — N83209 Unspecified ovarian cyst, unspecified side: Secondary | ICD-10-CM | POA: Insufficient documentation

## 2012-07-23 DIAGNOSIS — F329 Major depressive disorder, single episode, unspecified: Secondary | ICD-10-CM | POA: Insufficient documentation

## 2012-07-23 LAB — URINALYSIS, ROUTINE W REFLEX MICROSCOPIC
Ketones, ur: 15 mg/dL — AB
Protein, ur: NEGATIVE mg/dL
Urobilinogen, UA: 1 mg/dL (ref 0.0–1.0)

## 2012-07-23 LAB — URINE MICROSCOPIC-ADD ON

## 2012-07-23 LAB — PREGNANCY, URINE: Preg Test, Ur: NEGATIVE

## 2012-07-23 MED ORDER — ALBUTEROL SULFATE HFA 108 (90 BASE) MCG/ACT IN AERS: 1.0000 | INHALATION_SPRAY | Freq: Four times a day (QID) | RESPIRATORY_TRACT | Status: DC | PRN

## 2012-07-23 MED ORDER — ALBUTEROL SULFATE HFA 108 (90 BASE) MCG/ACT IN AERS
2.0000 | INHALATION_SPRAY | Freq: Once | RESPIRATORY_TRACT | Status: DC
  Filled 2012-07-23: qty 6.7

## 2012-07-23 MED ORDER — ALBUTEROL SULFATE HFA 108 (90 BASE) MCG/ACT IN AERS
2.0000 | INHALATION_SPRAY | Freq: Four times a day (QID) | RESPIRATORY_TRACT | Status: DC
  Administered 2012-07-23: 2 via RESPIRATORY_TRACT

## 2012-07-23 MED ORDER — CEPHALEXIN 500 MG PO CAPS: 500.0000 mg | ORAL_CAPSULE | Freq: Four times a day (QID) | ORAL | Status: DC

## 2012-07-23 MED ORDER — FLUCONAZOLE 200 MG PO TABS: 200.0000 mg | ORAL_TABLET | Freq: Every day | ORAL | Status: AC

## 2012-07-23 NOTE — ED Notes (Signed)
Pt. States that she is post partum on Oct 3rd. C/o left lower abd pain that started 3 days ago and radiates into her left back. Denies any urinary symptoms. States she has had a cough times one week as well and today felt a little sob. Denies fevers. resp even and unlabored at present.

## 2012-07-23 NOTE — ED Notes (Signed)
Pt resting, no acute distress, requesting to be discharged, md aware

## 2012-07-23 NOTE — ED Notes (Signed)
Pt has had cough since yesterday, but has sinus infection since Monday, pt states she saw pcp on Monday was started on amoxicillian . Pt states she feels like she is getting worse, originally started with nasal congestion but denied cough on Monday, congestion has progressed to chest per patient, pt currently everyday smoker

## 2012-07-23 NOTE — ED Provider Notes (Signed)
History     CSN: [id number]  Arrival date & time 07/23/12  1919   First MD Initiated Contact with Patient 07/23/12 2134      Chief Complaint  Patient presents with  . Abdominal Pain    (Consider location/radiation/quality/duration/timing/severity/associated sxs/prior treatment) Patient is a 31 y.o. female presenting with cough. The history is provided by the patient. No language interpreter was used.  Cough This is a new problem. Episode onset: 3 days. The problem occurs constantly. The cough is productive of sputum. The treatment provided moderate relief. She is a smoker. Her past medical history does not include pneumonia.   Pt complains of lower abdominal discomfort and a cough.  Pt reports she was recently treated for a sinus infection but now has a bad cough and she has had some shortness of breath.  Pt is 4 weeks post partem.   Past Medical History  Diagnosis Date  . Asthma   . Headache   . Anxiety   . Ovarian cyst   . Endometriosis   . Cleft lip   . Cleft hard palate   . PONV (postoperative nausea and vomiting)   . Depression   . Abnormal Pap smear     colpo 5/09    Past Surgical History  Procedure Date  . Cosmetic surgery   . Tympanostomy tube placement   . Abdominal surgery   . Laparoscopy   . Salpingectomy   . No past surgeries     Family History  Problem Relation Age of Onset  . Hypertension Maternal Grandfather   . Anesthesia problems Neg Hx     History  Substance Use Topics  . Smoking status: Current Every Day Smoker -- 0.5 packs/day for 5 years    Types: Cigarettes  . Smokeless tobacco: Never Used  . Alcohol Use: No    OB History    Grav Para Term Preterm Abortions TAB SAB Ect Mult Living   2 2 2       2       Review of Systems  Respiratory: Positive for cough.   Musculoskeletal: Positive for back pain.  All other systems reviewed and are negative.    Allergies  Vancomycin and Amoxicillin-pot clavulanate  Home Medications    Current Outpatient Rx  Name  Route  Sig  Dispense  Refill  . IBUPROFEN 600 MG PO TABS   Oral   Take 1 tablet (600 mg total) by mouth every 6 (six) hours.   60 tablet   0   . OXYCODONE-ACETAMINOPHEN 5-325 MG PO TABS   Oral   Take 1-2 tablets by mouth every 3 (three) hours as needed (moderate - severe pain).   30 tablet   0   . PRENATAL MULTIVITAMIN CH   Oral   Take 1 tablet by mouth every morning.            BP 131/84  Pulse 93  Temp 98.4 F (36.9 C) (Oral)  Resp 20  SpO2 96%  LMP 10/05/2011  Breastfeeding? No  Physical Exam  Nursing note and vitals reviewed. Constitutional: She is oriented to person, place, and time. She appears well-developed and well-nourished.  HENT:  Head: Normocephalic and atraumatic.  Right Ear: External ear normal.  Left Ear: External ear normal.  Nose: Nose normal.  Mouth/Throat: Oropharynx is clear and moist.  Eyes: Conjunctivae normal and EOM are normal. Pupils are equal, round, and reactive to light.  Neck: Normal range of motion. Neck supple.  Cardiovascular: Normal rate and  normal heart sounds.   Pulmonary/Chest: Effort normal.  Abdominal: Soft.  Musculoskeletal: Normal range of motion.  Neurological: She is oriented to person, place, and time. She has normal reflexes.  Skin: Skin is warm.  Psychiatric: She has a normal mood and affect.    ED Course  Procedures (including critical care time)  Labs Reviewed  URINALYSIS, ROUTINE W REFLEX MICROSCOPIC - Abnormal; Notable for the following:    APPearance CLOUDY (*)     Hgb urine dipstick SMALL (*)     Ketones, ur 15 (*)     All other components within normal limits  URINE MICROSCOPIC-ADD ON - Abnormal; Notable for the following:    Squamous Epithelial / LPF FEW (*)     Bacteria, UA MANY (*)     All other components within normal limits  PREGNANCY, URINE  URINE CULTURE   No results found.   No diagnosis found.    MDM  Chest xray no pneumonia.  Urine shows many  bacteria and yeast.   I will treat with albuterol inhaler, diflucan for yeast and bactrim ds        Lonia Skinner Lohman, [place] 07/23/12 2326

## 2012-07-25 LAB — URINE CULTURE

## 2012-07-30 NOTE — ED Provider Notes (Signed)
Medical screening examination/treatment/procedure(s) were performed by non-physician practitioner and as supervising physician I was immediately available for consultation/collaboration.   Rolan Bucco, MD 07/30/12 [phone]

## 2012-08-30 ENCOUNTER — Other Ambulatory Visit: Payer: Self-pay | Admitting: Obstetrics and Gynecology

## 2012-10-08 ENCOUNTER — Emergency Department (HOSPITAL_BASED_OUTPATIENT_CLINIC_OR_DEPARTMENT_OTHER): Payer: Medicaid Other

## 2012-10-08 ENCOUNTER — Emergency Department (HOSPITAL_BASED_OUTPATIENT_CLINIC_OR_DEPARTMENT_OTHER)
Admission: EM | Admit: 2012-10-08 | Discharge: 2012-10-08 | Disposition: A | Discharge status: Home / Self Care | Payer: Medicaid Other | Attending: Emergency Medicine | Admitting: Emergency Medicine

## 2012-10-08 ENCOUNTER — Encounter (HOSPITAL_BASED_OUTPATIENT_CLINIC_OR_DEPARTMENT_OTHER): Payer: Self-pay | Admitting: *Deleted

## 2012-10-08 DIAGNOSIS — Y929 Unspecified place or not applicable: Secondary | ICD-10-CM | POA: Insufficient documentation

## 2012-10-08 DIAGNOSIS — Z8742 Personal history of other diseases of the female genital tract: Secondary | ICD-10-CM | POA: Insufficient documentation

## 2012-10-08 DIAGNOSIS — S60221A Contusion of right hand, initial encounter: Secondary | ICD-10-CM

## 2012-10-08 DIAGNOSIS — S60229A Contusion of unspecified hand, initial encounter: Secondary | ICD-10-CM | POA: Insufficient documentation

## 2012-10-08 DIAGNOSIS — Y939 Activity, unspecified: Secondary | ICD-10-CM | POA: Insufficient documentation

## 2012-10-08 DIAGNOSIS — IMO0002 Reserved for concepts with insufficient information to code with codable children: Secondary | ICD-10-CM | POA: Insufficient documentation

## 2012-10-08 DIAGNOSIS — J45909 Unspecified asthma, uncomplicated: Secondary | ICD-10-CM | POA: Insufficient documentation

## 2012-10-08 DIAGNOSIS — Z79899 Other long term (current) drug therapy: Secondary | ICD-10-CM | POA: Insufficient documentation

## 2012-10-08 DIAGNOSIS — F17200 Nicotine dependence, unspecified, uncomplicated: Secondary | ICD-10-CM | POA: Insufficient documentation

## 2012-10-08 DIAGNOSIS — F411 Generalized anxiety disorder: Secondary | ICD-10-CM | POA: Insufficient documentation

## 2012-10-08 NOTE — ED Provider Notes (Signed)
History   This chart was scribed for Geoffery Lyons, MD by Melba Coon, ED Scribe. The patient was seen in room MHT13/MHT13 and the patient's care was started at 10:04PM.    CSN: [id number]  Arrival date & time 10/08/12  2050   First MD Initiated Contact with Patient 10/08/12 2202      Chief Complaint  Patient presents with  . Hand Injury    (Consider location/radiation/quality/duration/timing/severity/associated sxs/prior treatment) The history is provided by the patient. No language interpreter was used.   Pamela Bailey is a 32 y.o. female who presents to the Emergency Department complaining of constant, moderate to severe right hand pain pertaining to an injury with a sudden onset 3 hours ago. She hit her hand on a counter top and was concerned about a fracture. She reports moving and lifting her left arm aggravates the pain. Denies HA, fever, neck pain, sore throat, rash, back pain, CP, SOB, abdominal pain, nausea, emesis, diarrhea, dysuria, or extremity weakness, numbness, or tingling. No other pertinent medical symptoms.  Past Medical History  Diagnosis Date  . Asthma   . Headache   . Anxiety   . Ovarian cyst   . Endometriosis   . Cleft lip   . Cleft hard palate   . PONV (postoperative nausea and vomiting)   . Depression   . Abnormal Pap smear     colpo 5/09    Past Surgical History  Procedure Date  . Cosmetic surgery   . Tympanostomy tube placement   . Abdominal surgery   . Laparoscopy   . Salpingectomy   . No past surgeries     Family History  Problem Relation Age of Onset  . Hypertension Maternal Grandfather   . Anesthesia problems Neg Hx     History  Substance Use Topics  . Smoking status: Current Every Day Smoker -- 0.5 packs/day for 5 years    Types: Cigarettes  . Smokeless tobacco: Never Used  . Alcohol Use: No    OB History    Grav Para Term Preterm Abortions TAB SAB Ect Mult Living   2 2 2       2       Review of Systems 10 Systems  reviewed and all are negative for acute change except as noted in the HPI.   Allergies  Vancomycin and Amoxicillin-pot clavulanate  Home Medications   Current Outpatient Rx  Name  Route  Sig  Dispense  Refill  . ALPRAZOLAM 1 MG PO TABS   Oral   Take 1 mg by mouth as needed.         . ALBUTEROL SULFATE HFA 108 (90 BASE) MCG/ACT IN AERS   Inhalation   Inhale 1-2 puffs into the lungs every 6 (six) hours as needed for wheezing.   1 Inhaler   0   . CEPHALEXIN 500 MG PO CAPS   Oral   Take 1 capsule (500 mg total) by mouth 4 (four) times daily.   40 capsule   0   . IBUPROFEN 600 MG PO TABS   Oral   Take 1 tablet (600 mg total) by mouth every 6 (six) hours.   60 tablet   0   . OXYCODONE-ACETAMINOPHEN 5-325 MG PO TABS   Oral   Take 1-2 tablets by mouth every 3 (three) hours as needed (moderate - severe pain).   30 tablet   0   . PRENATAL MULTIVITAMIN CH   Oral   Take 1 tablet by mouth  every morning.            BP 135/76  Pulse 74  Temp 98.2 F (36.8 C) (Oral)  Resp 20  Ht 5\' 3"  (1.6 m)  Wt 88 lb 8 oz (40.143 kg)  BMI 15.68 kg/m2  SpO2 99%  LMP 10/08/2012  Physical Exam  Nursing note and vitals reviewed. Constitutional:       Awake, alert, nontoxic appearance.  HENT:  Head: Atraumatic.  Eyes: Right eye exhibits no discharge. Left eye exhibits no discharge.  Neck: Neck supple.  Pulmonary/Chest: Effort normal. She exhibits no tenderness.  Abdominal: Soft. There is no tenderness. There is no rebound.  Musculoskeletal: She exhibits tenderness.       Ecchymosis over the lateral aspect of the right hand around the 5th metacarpal without deformity or obvious new focal weakness.  Neurological:       Mental status and motor strength appears baseline for patient and situation.  Skin: No rash noted.  Psychiatric: She has a normal mood and affect.    ED Course  Procedures (including critical care time)  DIAGNOSTIC STUDIES: Oxygen Saturation is 99% on room  air, normal by my interpretation.    COORDINATION OF CARE:  10:08PM - right hand XR will be ordered for Pamela Bailey.  10:15PM - imaging reviewed and is unremarkable. She will be given a ulnar gutter splint. She is ready for d/c.   Labs Reviewed - No data to display Dg Hand Complete Right  10/08/2012  *RADIOLOGY REPORT*  Clinical Data: Pain post trauma  RIGHT HAND - COMPLETE 3+ VIEW  Comparison: None.  Findings: Frontal, oblique, and lateral views were obtained.  No fracture or dislocation.  Joint spaces appear intact.  No erosive change.  IMPRESSION: No abnormality noted.   Original Report Authenticated By: Bretta Bang, M.D.      No diagnosis found.    MDM  Xrays are negative for fracture.  Will splint, ice, follow up prn if not improving.    I personally performed the services described in this documentation, which was scribed in my presence. The recorded information has been reviewed and is accurate.           Geoffery Lyons, MD 10/08/12 2219

## 2012-10-08 NOTE — ED Notes (Signed)
Pt. States that she hit her right hand on a counter top approx 2 hours ago. brusing noted to outer part of right hand with a small amount of swelling. No deformity noted.

## 2013-01-31 ENCOUNTER — Encounter (HOSPITAL_COMMUNITY): Payer: Self-pay | Admitting: *Deleted

## 2013-01-31 ENCOUNTER — Inpatient Hospital Stay (HOSPITAL_COMMUNITY)
Admission: AD | Admit: 2013-01-31 | Discharge: 2013-02-01 | Disposition: A | Discharge status: Home / Self Care | Payer: Medicaid Other | Source: Ambulatory Visit | Attending: Obstetrics and Gynecology | Admitting: Obstetrics and Gynecology

## 2013-01-31 DIAGNOSIS — R109 Unspecified abdominal pain: Secondary | ICD-10-CM | POA: Insufficient documentation

## 2013-01-31 DIAGNOSIS — G8929 Other chronic pain: Secondary | ICD-10-CM

## 2013-01-31 DIAGNOSIS — Z8742 Personal history of other diseases of the female genital tract: Secondary | ICD-10-CM | POA: Insufficient documentation

## 2013-01-31 LAB — URINALYSIS, ROUTINE W REFLEX MICROSCOPIC
Glucose, UA: NEGATIVE mg/dL
Hgb urine dipstick: NEGATIVE
Leukocytes, UA: NEGATIVE
Specific Gravity, Urine: 1.015 (ref 1.005–1.030)
pH: 7 (ref 5.0–8.0)

## 2013-01-31 LAB — POCT PREGNANCY, URINE: Preg Test, Ur: NEGATIVE

## 2013-01-31 NOTE — MAU Note (Signed)
Pt. Here due to sharp and stabbing pain that she has been experiencing for 3 months. 3 weeks ago did go to the Dr. (PCP) and they gave her IM toradol due to the pain. Pt. States tonight pain has increased so much that she felt she needed to come in. Denies bleeding. Does have a period 2 times per month. Last period was 01/23/13.

## 2013-01-31 NOTE — MAU Provider Note (Signed)
Chief Complaint: Abdominal Pain  First Provider Initiated Contact with Patient 02/01/13 0009     SUBJECTIVE HPI: Pamela Bailey is a 32 y.o. G11P2002 female who presents with gradual worsening of intermittent, generalized, bilateral low abd pain times 2-3 months. Has history of endometriosis and has had two laparoscopies. States his pain is similar to endometriosis pain and that she was seen by Dr. Rana Snare for it and he recommended colonoscopy. Patient states she has rescheduled the colonoscopy multiple times do to issues with child care. She denies fever, chills, vaginal bleeding, vaginal discharge, nausea, vomiting, diarrhea, constipation, bloody stools, loss of appetite. Pain not related to menstrual cycle. No aggravating or alleviating factors. Has taken ibuprofen with minimal relief of symptoms.  Patient came with her friends to MAU. Declined pain medication.  Past Medical History  Diagnosis Date  . Asthma   . Headache   . Anxiety   . Ovarian cyst   . Endometriosis   . Cleft lip   . Cleft hard palate   . PONV (postoperative nausea and vomiting)   . Depression   . Abnormal Pap smear     colpo 5/09   OB History   Grav Para Term Preterm Abortions TAB SAB Ect Mult Living   2 2 2       2      # Outc Date GA Lbr Len/2nd Wgt Sex Del Anes PTL Lv   1 TRM 10/13 [redacted]w[redacted]d 2079:12 / 00:31  M SVD EPI  Yes   Comments: Cleft lip & palate   2 TRM     F SVD EPI  Yes   Comments: 36 weeks     Past Surgical History  Procedure Laterality Date  . Cosmetic surgery    . Tympanostomy tube placement    . Abdominal surgery    . Laparoscopy    . Salpingectomy     History   Social History  . Marital Status: Single    Spouse Name: N/A    Number of Children: N/A  . Years of Education: N/A   Occupational History  . Not on file.   Social History Main Topics  . Smoking status: Current Every Day Smoker -- 0.50 packs/day for 8 years    Types: Cigarettes  . Smokeless tobacco: Never Used  . Alcohol  Use: No  . Drug Use: No  . Sexually Active: Yes    Birth Control/ Protection: None   Other Topics Concern  . Not on file   Social History Narrative  . No narrative on file   No current facility-administered medications on file prior to encounter.   Current Outpatient Prescriptions on File Prior to Encounter  Medication Sig Dispense Refill  . ibuprofen (ADVIL,MOTRIN) 600 MG tablet Take 1 tablet (600 mg total) by mouth every 6 (six) hours.  60 tablet  0   Allergies  Allergen Reactions  . Vancomycin Other (See Comments)    Acute kidney injury   . Amoxicillin-Pot Clavulanate Nausea And Vomiting and Rash    ROS: Pertinent items in HPI  OBJECTIVE Blood pressure 118/58, pulse 68, temperature 98 F (36.7 C), temperature source Oral, resp. rate 16, height 5\' 2"  (1.575 m), weight 39.576 kg (87 lb 4 oz), last menstrual period 01/23/2013, SpO2 99.00%. GENERAL: Well-developed, well-nourished female in no acute distress.  HEENT: Normocephalic. Repaired cleft lip. HEART: normal rate RESP: normal effort ABDOMEN: Soft, mild bilateral low abd tenderness. Pos BS x4. No rebound. No tympany on percussion.  EXTREMITIES: Nontender, no edema  NEURO: Alert and oriented SPECULUM EXAM: Declined.   LAB RESULTS Results for orders placed during the hospital encounter of 01/31/13 (from the past 24 hour(s))  URINALYSIS, ROUTINE W REFLEX MICROSCOPIC     Status: None   Collection Time    01/31/13  9:15 PM      Result Value Range   Color, Urine YELLOW  YELLOW   APPearance CLEAR  CLEAR   Specific Gravity, Urine 1.015  1.005 - 1.030   pH 7.0  5.0 - 8.0   Glucose, UA NEGATIVE  NEGATIVE mg/dL   Hgb urine dipstick NEGATIVE  NEGATIVE   Bilirubin Urine NEGATIVE  NEGATIVE   Ketones, ur NEGATIVE  NEGATIVE mg/dL   Protein, ur NEGATIVE  NEGATIVE mg/dL   Urobilinogen, UA 0.2  0.0 - 1.0 mg/dL   Nitrite NEGATIVE  NEGATIVE   Leukocytes, UA NEGATIVE  NEGATIVE  POCT PREGNANCY, URINE     Status: None    Collection Time    01/31/13  9:58 PM      Result Value Range   Preg Test, Ur NEGATIVE  NEGATIVE    IMAGING No results found.  MAU COURSE Declined GC/CT, Wet prep. Sleeping when RN returned to room to reassess need for pain meds.   Discussed patient symptoms, normal UA, negative UPT, patient declination of pelvic exam, history of endometriosis and chronic low abdominal pain and failure to follow through recommended colonoscopy with Dr. Arelia Sneddon. No new orders. Small amount of Percocet. Encourage patient to follow-up as instructed with colonoscopy.  When pt awaoke, declined Toradol due to not working in past. Could not give opiate due to pt driving. Will fill Rx Percocet on way home.  ASSESSMENT 1. Chronic bilateral lower abdominal pain    PLAN Discharge home in stable condition per consult with Dr. Arelia Sneddon.     Follow-up Information   Follow up with LOWE,DAVID C, MD. (As needed)    Contact information:   [address], [address] [place] [address] [phone]       Follow up with Colonoscopy. (as instructed)        Medication List    TAKE these medications       ibuprofen 600 MG tablet  Commonly known as:  ADVIL,MOTRIN  Take 1 tablet (600 mg total) by mouth every 6 (six) hours.     oxyCODONE-acetaminophen 5-325 MG per tablet  Commonly known as:  PERCOCET/ROXICET  Take 1-2 tablets by mouth every 6 (six) hours as needed.       [place], CNM 02/01/2013  1:39 AM

## 2013-01-31 NOTE — MAU Note (Signed)
Pt States she has had abd pain present for 2-3 months that comes and goes-it has gotten worse now

## 2013-02-01 DIAGNOSIS — R1031 Right lower quadrant pain: Secondary | ICD-10-CM

## 2013-02-01 DIAGNOSIS — R1032 Left lower quadrant pain: Secondary | ICD-10-CM

## 2013-02-01 DIAGNOSIS — G8929 Other chronic pain: Secondary | ICD-10-CM

## 2013-02-01 MED ORDER — OXYCODONE-ACETAMINOPHEN 5-325 MG PO TABS: 1.0000 | ORAL_TABLET | Freq: Four times a day (QID) | ORAL | Status: DC | PRN

## 2013-02-01 MED ORDER — OXYCODONE-ACETAMINOPHEN 5-325 MG PO TABS
2.0000 | ORAL_TABLET | Freq: Once | ORAL | Status: DC | PRN
  Filled 2013-02-01: qty 2

## 2013-08-25 ENCOUNTER — Emergency Department (HOSPITAL_COMMUNITY): Payer: Medicaid Other

## 2013-08-25 ENCOUNTER — Encounter (HOSPITAL_COMMUNITY): Payer: Self-pay | Admitting: Emergency Medicine

## 2013-08-25 ENCOUNTER — Other Ambulatory Visit: Payer: Self-pay

## 2013-08-25 ENCOUNTER — Emergency Department (HOSPITAL_COMMUNITY)
Admission: EM | Admit: 2013-08-25 | Discharge: 2013-08-25 | Disposition: A | Discharge status: Home / Self Care | Payer: Medicaid Other | Attending: Emergency Medicine | Admitting: Emergency Medicine

## 2013-08-25 DIAGNOSIS — G8929 Other chronic pain: Secondary | ICD-10-CM | POA: Insufficient documentation

## 2013-08-25 DIAGNOSIS — R000 Tachycardia, unspecified: Secondary | ICD-10-CM | POA: Insufficient documentation

## 2013-08-25 DIAGNOSIS — R1032 Left lower quadrant pain: Secondary | ICD-10-CM | POA: Insufficient documentation

## 2013-08-25 DIAGNOSIS — J45909 Unspecified asthma, uncomplicated: Secondary | ICD-10-CM | POA: Insufficient documentation

## 2013-08-25 DIAGNOSIS — J189 Pneumonia, unspecified organism: Secondary | ICD-10-CM

## 2013-08-25 DIAGNOSIS — R1031 Right lower quadrant pain: Secondary | ICD-10-CM | POA: Insufficient documentation

## 2013-08-25 DIAGNOSIS — Z8659 Personal history of other mental and behavioral disorders: Secondary | ICD-10-CM | POA: Insufficient documentation

## 2013-08-25 DIAGNOSIS — Z3202 Encounter for pregnancy test, result negative: Secondary | ICD-10-CM | POA: Insufficient documentation

## 2013-08-25 DIAGNOSIS — Z8742 Personal history of other diseases of the female genital tract: Secondary | ICD-10-CM | POA: Insufficient documentation

## 2013-08-25 DIAGNOSIS — R209 Unspecified disturbances of skin sensation: Secondary | ICD-10-CM | POA: Insufficient documentation

## 2013-08-25 DIAGNOSIS — F17200 Nicotine dependence, unspecified, uncomplicated: Secondary | ICD-10-CM | POA: Insufficient documentation

## 2013-08-25 DIAGNOSIS — J159 Unspecified bacterial pneumonia: Secondary | ICD-10-CM | POA: Insufficient documentation

## 2013-08-25 DIAGNOSIS — Q379 Unspecified cleft palate with unilateral cleft lip: Secondary | ICD-10-CM | POA: Insufficient documentation

## 2013-08-25 DIAGNOSIS — R112 Nausea with vomiting, unspecified: Secondary | ICD-10-CM | POA: Insufficient documentation

## 2013-08-25 LAB — URINALYSIS, ROUTINE W REFLEX MICROSCOPIC
Nitrite: NEGATIVE
Specific Gravity, Urine: 1.027 (ref 1.005–1.030)
Urobilinogen, UA: 0.2 mg/dL (ref 0.0–1.0)
pH: 6.5 (ref 5.0–8.0)

## 2013-08-25 LAB — DIFFERENTIAL
Basophils Absolute: 0 10*3/uL (ref 0.0–0.1)
Basophils Relative: 0 % (ref 0–1)
Eosinophils Absolute: 0 10*3/uL (ref 0.0–0.7)
Monocytes Relative: 7 % (ref 3–12)
Neutro Abs: 19.5 10*3/uL — ABNORMAL HIGH (ref 1.7–7.7)
Neutrophils Relative %: 85 % — ABNORMAL HIGH (ref 43–77)

## 2013-08-25 LAB — BASIC METABOLIC PANEL
BUN: 17 mg/dL (ref 6–23)
Chloride: 98 mEq/L (ref 96–112)
GFR calc Af Amer: >90 mL/min (ref >90)
Glucose, Bld: 97 mg/dL (ref 70–99)
Potassium: 4.2 mEq/L (ref 3.5–5.1)
Sodium: 133 mEq/L — ABNORMAL LOW (ref 135–145)

## 2013-08-25 LAB — HEPATIC FUNCTION PANEL
ALT: 17 U/L (ref 0–35)
Bilirubin, Direct: <0.1 mg/dL (ref 0.0–0.3)
Total Protein: 7.4 g/dL (ref 6.0–8.3)

## 2013-08-25 LAB — POCT I-STAT TROPONIN I: Troponin i, poc: 0 ng/mL (ref 0.00–0.08)

## 2013-08-25 LAB — WET PREP, GENITAL
Clue Cells Wet Prep HPF POC: NONE SEEN
WBC, Wet Prep HPF POC: NONE SEEN

## 2013-08-25 LAB — POCT PREGNANCY, URINE: Preg Test, Ur: NEGATIVE

## 2013-08-25 LAB — CBC
HCT: 41 % (ref 36.0–46.0)
Hemoglobin: 14.3 g/dL (ref 12.0–15.0)
WBC: 23.3 10*3/uL — ABNORMAL HIGH (ref 4.0–10.5)

## 2013-08-25 LAB — LIPASE, BLOOD: Lipase: 26 U/L (ref 11–59)

## 2013-08-25 LAB — D-DIMER, QUANTITATIVE: D-Dimer, Quant: <0.27 ug/mL-FEU (ref 0.00–0.48)

## 2013-08-25 MED ORDER — LEVOFLOXACIN 500 MG PO TABS: 500.0000 mg | ORAL_TABLET | Freq: Every day | ORAL | Status: DC

## 2013-08-25 MED ORDER — PROMETHAZINE HCL 25 MG PO TABS
25.0000 mg | ORAL_TABLET | Freq: Once | ORAL | Status: AC
  Administered 2013-08-25: 25 mg via ORAL
  Filled 2013-08-25: qty 1

## 2013-08-25 MED ORDER — IOHEXOL 300 MG/ML  SOLN
50.0000 mL | Freq: Once | INTRAMUSCULAR | Status: AC | PRN
  Administered 2013-08-25: 50 mL via ORAL

## 2013-08-25 MED ORDER — SODIUM CHLORIDE 0.9 % IV BOLUS (SEPSIS)
1000.0000 mL | Freq: Once | INTRAVENOUS | Status: AC
  Administered 2013-08-25: 1000 mL via INTRAVENOUS

## 2013-08-25 MED ORDER — IOHEXOL 300 MG/ML  SOLN
100.0000 mL | Freq: Once | INTRAMUSCULAR | Status: AC | PRN
  Administered 2013-08-25: 100 mL via INTRAVENOUS

## 2013-08-25 MED ORDER — PROMETHAZINE HCL 25 MG PO TABS: 25.0000 mg | ORAL_TABLET | Freq: Four times a day (QID) | ORAL | Status: DC | PRN

## 2013-08-25 MED ORDER — ONDANSETRON 4 MG PO TBDP: 4.0000 mg | ORAL_TABLET | Freq: Once | ORAL | Status: DC

## 2013-08-25 MED ORDER — OXYCODONE-ACETAMINOPHEN 5-325 MG PO TABS
2.0000 | ORAL_TABLET | Freq: Once | ORAL | Status: AC
  Administered 2013-08-25: 2 via ORAL
  Filled 2013-08-25: qty 2

## 2013-08-25 NOTE — ED Provider Notes (Addendum)
CSN: [id number]     Arrival date & time 08/25/13  1701 History   First MD Initiated Contact with Patient 08/25/13 1738     Chief Complaint  Patient presents with  . Abdominal Pain  . Chest Pain   (Consider location/radiation/quality/duration/timing/severity/associated sxs/prior Treatment) HPI Comments: 32 yo female with 3-4 days of chest pain, dyspnea, productive cough and congestion. Also states she's having lower abd pain during this time. Had a "sinus infection" last week, and states she just recently finished a Zpack for this. Now feels her symptoms are worse. Has also had some subjective fever and chills. Denies dysuria or vaginal symptoms (bleeding, discharge, etc). Has a long history of endometriosis and chronic abd pain and feels like this is similar but not quite like it. Has also been feeling nauseous with some vomiting during this time.    Past Medical History  Diagnosis Date  . Asthma   . Headache(784.0)   . Anxiety   . Ovarian cyst   . Endometriosis   . Cleft lip   . Cleft hard palate   . PONV (postoperative nausea and vomiting)   . Depression   . Abnormal Pap smear     colpo 5/09   Past Surgical History  Procedure Laterality Date  . Cosmetic surgery    . Tympanostomy tube placement    . Abdominal surgery    . Laparoscopy    . Salpingectomy    . No past surgeries     Family History  Problem Relation Age of Onset  . Hypertension Maternal Grandfather   . Anesthesia problems Neg Hx    History  Substance Use Topics  . Smoking status: Current Every Day Smoker -- 0.50 packs/day for 8 years    Types: Cigarettes  . Smokeless tobacco: Never Used  . Alcohol Use: No   OB History   Grav Para Term Preterm Abortions TAB SAB Ect Mult Living   2 2 2       2      Review of Systems  Constitutional: Positive for fever and chills.  HENT: Positive for congestion.   Respiratory: Positive for shortness of breath.   Cardiovascular: Positive for chest pain.   Gastrointestinal: Positive for nausea, vomiting and abdominal pain.  Genitourinary: Negative for dysuria, hematuria, vaginal bleeding and vaginal discharge.  Musculoskeletal: Negative for myalgias.  Neurological: Positive for numbness. Negative for weakness and headaches.  All other systems reviewed and are negative.    Allergies  Vancomycin and Amoxicillin-pot clavulanate  Home Medications   Current Outpatient Rx  Name  Route  Sig  Dispense  Refill  . acetaminophen (TYLENOL) 500 MG tablet   Oral   Take 500 mg by mouth every 6 (six) hours as needed.          BP 112/67  Pulse 111  Temp(Src) 99.1 F (37.3 C) (Oral)  Resp 18  SpO2 98%  LMP 08/11/2013 Physical Exam  Nursing note and vitals reviewed. Constitutional: She is oriented to person, place, and time.  Very thin  HENT:  Head: Normocephalic and atraumatic.  Right Ear: External ear normal.  Left Ear: External ear normal.  Nose: Nose normal.  Eyes: Right eye exhibits no discharge. Left eye exhibits no discharge.  Cardiovascular: Regular rhythm and normal heart sounds.  Tachycardia present.   Pulmonary/Chest: Effort normal and breath sounds normal. She has no wheezes. She has no rales. She exhibits tenderness.  Abdominal: Soft. There is tenderness in the right lower quadrant, suprapubic area and left  lower quadrant.  Genitourinary: Uterus is not enlarged and not tender. Cervix exhibits no motion tenderness.  Neurological: She is alert and oriented to person, place, and time.  Skin: Skin is warm and dry.    ED Course  Procedures (including critical care time) Labs Review Labs Reviewed  CBC - Abnormal; Notable for the following:    WBC 23.3 (*)    All other components within normal limits  BASIC METABOLIC PANEL - Abnormal; Notable for the following:    Sodium 133 (*)    All other components within normal limits  URINALYSIS, ROUTINE W REFLEX MICROSCOPIC - Abnormal; Notable for the following:    Bilirubin Urine  SMALL (*)    All other components within normal limits  HEPATIC FUNCTION PANEL - Abnormal; Notable for the following:    Total Bilirubin 0.1 (*)    All other components within normal limits  DIFFERENTIAL - Abnormal; Notable for the following:    Neutrophils Relative % 85 (*)    Neutro Abs 19.5 (*)    Lymphocytes Relative 8 (*)    Monocytes Absolute 1.6 (*)    All other components within normal limits  WET PREP, GENITAL  GC/CHLAMYDIA PROBE AMP  D-DIMER, QUANTITATIVE  LIPASE, BLOOD  POCT I-STAT TROPONIN I  POCT PREGNANCY, URINE   Imaging Review Dg Chest 2 View  08/25/2013   CLINICAL DATA:  Shortness of breath.  Chest pain and fever  EXAM: CHEST  2 VIEW  COMPARISON:  07/23/2012  FINDINGS: The heart size and mediastinal contours are within normal limits. The lungs are hyperinflated but clear. There are coarsened interstitial markings noted bilaterally. The visualized skeletal structures are unremarkable.  IMPRESSION: 1. Hyperinflation. 2. No pneumonia.   Electronically Signed   By: Signa Kell M.D.   On: 08/25/2013 18:15   Ct Abdomen Pelvis W Contrast  08/25/2013   CLINICAL DATA:  Lower abdominal and pelvic pain for the past 3 days. 3 previous laparoscopic surgical procedures for endometriosis. Previous appendectomy. Cough, chest congestion and fatigue.  EXAM: CT ABDOMEN AND PELVIS WITH CONTRAST  TECHNIQUE: Multidetector CT imaging of the abdomen and pelvis was performed using the standard protocol following bolus administration of intravenous contrast.  CONTRAST:  50mL OMNIPAQUE IOHEXOL 300 MG/ML SOLN, OMNIPAQUE IOHEXOL 300 MG/ML SOLN  COMPARISON:  07/09/2011.  FINDINGS: Interval patchy opacities at both lung bases, involving both lower lobes and of the right middle lobe. No pleural fluid. Normal appearing liver, spleen, pancreas, gallbladder, adrenal glands, kidneys, urinary bladder and uterus. No adnexal masses or enlarged lymph nodes. No gastrointestinal abnormalities. No evidence of  appendicitis, surgically absent by history. Normal appearing bones.  IMPRESSION: 1. Bibasilar pneumonia. 2. No acute abdominal or pelvic abnormality.   Electronically Signed   By: Gordan Payment M.D.   On: 08/25/2013 21:23    EKG Interpretation   None       MDM   1. Community acquired pneumonia    Patient appears well, has mild lower abdominal tenderness but no signs of peritonitis. Her GU exam shows no cervical motion tenderness. Low suspicion for torsion or other gynecologic issues. Unable to give good explanation for bilateral thigh numbness. It does not extend past her hips or knees. On exam she has normal sensation to my touch. She also is normal gait and lower extremity strength. CT scan does show bibasilar pneumonia. Given her upper respiratory symptoms, we'll cover with antibiotics. Her curb 65 score is low, suggesting that she can choose an outpatient if she's  not have any signs of respiratory distress or hypoxia. Discussed strict return precautions and will have her follow up with her PCP.    Audree Camel, MD 08/26/13 2317  Audree Camel, MD 08/26/13 [id number]

## 2013-08-25 NOTE — ED Notes (Addendum)
Pt presents with lower abdomen/pelvic pain that started yesterday. Pt also reports cough that started two weeks ago and now the patient reports chest pain and shortness of breath. Pt reports lightheadedness, nausea, and back pain. Pt reports smoking 0.5 packs of cigarettes per day. Pt also reports bilateral leg numbness.

## 2013-08-25 NOTE — ED Notes (Signed)
She is relaxed in appearance and is tolerating liquids (Sprite and water). OK.

## 2013-08-25 NOTE — ED Notes (Signed)
She tells me she has had cough/congestion and "fatigue" x 5 days.  She c/o low abd./pelvic discomfort x 3 days.  She states she has hx of laparoscopic surg. X 3 for endometriosis.  Seh further tells me she recently had a "normal" period.  She is in no distress.

## 2013-08-27 LAB — GC/CHLAMYDIA PROBE AMP: CT Probe RNA: NEGATIVE

## 2013-09-20 HISTORY — PX: PARTIAL HYSTERECTOMY: SHX80

## 2013-09-20 HISTORY — PX: BREAST BIOPSY: SHX20

## 2013-11-18 ENCOUNTER — Encounter (HOSPITAL_COMMUNITY): Payer: Self-pay | Admitting: Emergency Medicine

## 2013-11-18 ENCOUNTER — Emergency Department (HOSPITAL_COMMUNITY): Payer: Medicaid Other

## 2013-11-18 ENCOUNTER — Emergency Department (HOSPITAL_COMMUNITY)
Admission: EM | Admit: 2013-11-18 | Discharge: 2013-11-18 | Disposition: A | Discharge status: Home / Self Care | Payer: Medicaid Other | Attending: Emergency Medicine | Admitting: Emergency Medicine

## 2013-11-18 DIAGNOSIS — Y9389 Activity, other specified: Secondary | ICD-10-CM | POA: Insufficient documentation

## 2013-11-18 DIAGNOSIS — F17200 Nicotine dependence, unspecified, uncomplicated: Secondary | ICD-10-CM | POA: Insufficient documentation

## 2013-11-18 DIAGNOSIS — J45909 Unspecified asthma, uncomplicated: Secondary | ICD-10-CM | POA: Insufficient documentation

## 2013-11-18 DIAGNOSIS — Q359 Cleft palate, unspecified: Secondary | ICD-10-CM | POA: Insufficient documentation

## 2013-11-18 DIAGNOSIS — S8992XA Unspecified injury of left lower leg, initial encounter: Secondary | ICD-10-CM

## 2013-11-18 DIAGNOSIS — S8990XA Unspecified injury of unspecified lower leg, initial encounter: Secondary | ICD-10-CM | POA: Insufficient documentation

## 2013-11-18 DIAGNOSIS — S99919A Unspecified injury of unspecified ankle, initial encounter: Principal | ICD-10-CM

## 2013-11-18 DIAGNOSIS — Y929 Unspecified place or not applicable: Secondary | ICD-10-CM | POA: Insufficient documentation

## 2013-11-18 DIAGNOSIS — Q369 Cleft lip, unilateral: Secondary | ICD-10-CM | POA: Insufficient documentation

## 2013-11-18 DIAGNOSIS — Z8659 Personal history of other mental and behavioral disorders: Secondary | ICD-10-CM | POA: Insufficient documentation

## 2013-11-18 DIAGNOSIS — Z8742 Personal history of other diseases of the female genital tract: Secondary | ICD-10-CM | POA: Insufficient documentation

## 2013-11-18 DIAGNOSIS — Z792 Long term (current) use of antibiotics: Secondary | ICD-10-CM | POA: Insufficient documentation

## 2013-11-18 DIAGNOSIS — X500XXA Overexertion from strenuous movement or load, initial encounter: Secondary | ICD-10-CM | POA: Insufficient documentation

## 2013-11-18 DIAGNOSIS — S99929A Unspecified injury of unspecified foot, initial encounter: Principal | ICD-10-CM

## 2013-11-18 MED ORDER — HYDROCODONE-ACETAMINOPHEN 5-325 MG PO TABS
1.0000 | ORAL_TABLET | Freq: Once | ORAL | Status: AC
  Administered 2013-11-18: 1 via ORAL
  Filled 2013-11-18: qty 1

## 2013-11-18 MED ORDER — IBUPROFEN 800 MG PO TABS: 800.0000 mg | ORAL_TABLET | Freq: Three times a day (TID) | ORAL | Status: DC

## 2013-11-18 MED ORDER — METHOCARBAMOL 500 MG PO TABS: 500.0000 mg | ORAL_TABLET | Freq: Two times a day (BID) | ORAL | Status: DC

## 2013-11-18 NOTE — ED Provider Notes (Signed)
CSN: [id number]     Arrival date & time 11/18/13  1106 History  This chart was scribed for non-physician practitioner Fayrene Helper, working with Gerhard Munch, MD by Carl Best, ED Scribe. This patient was seen in room TR06C/TR06C and the patient's care was started at 11:54 AM.    Chief Complaint  Patient presents with  . Knee Injury      The history is provided by the patient. No language interpreter was used.   HPI Comments: Pamela Bailey is a 33 y.o. female who presents to the Emergency Department complaining of a dislocated right knee that started an hour and a half ago after the patient swung her leg to get off of the couch.  She states that her husband heard a cracking sound and she saw the knee pop out of place.  She states that she put pressure on her right knee and she felt the patella slide to the right and pop back into place.  She states that at the time of the incident she felt her right foot and the back of her right leg go numb.  She denies using any pain medication PTA.  She denies being allergic to any medications.     Past Medical History  Diagnosis Date  . Asthma   . Headache(784.0)   . Anxiety   . Ovarian cyst   . Endometriosis   . Cleft lip   . Cleft hard palate   . PONV (postoperative nausea and vomiting)   . Depression   . Abnormal Pap smear     colpo 5/09   Past Surgical History  Procedure Laterality Date  . Cosmetic surgery    . Tympanostomy tube placement    . Abdominal surgery    . Laparoscopy    . Salpingectomy    . No past surgeries     Family History  Problem Relation Age of Onset  . Hypertension Maternal Grandfather   . Anesthesia problems Neg Hx    History  Substance Use Topics  . Smoking status: Current Every Day Smoker -- 0.50 packs/day for 8 years    Types: Cigarettes  . Smokeless tobacco: Never Used  . Alcohol Use: No   OB History   Grav Para Term Preterm Abortions TAB SAB Ect Mult Living   2 2 2       2      Review of  Systems  Constitutional: Negative for fever.  Musculoskeletal: Positive for arthralgias (right knee).  All other systems reviewed and are negative.      Allergies  Vancomycin and Amoxicillin-pot clavulanate  Home Medications   Current Outpatient Rx  Name  Route  Sig  Dispense  Refill  . acetaminophen (TYLENOL) 500 MG tablet   Oral   Take 500 mg by mouth every 6 (six) hours as needed.         Marland Kitchen levofloxacin (LEVAQUIN) 500 MG tablet   Oral   Take 1 tablet (500 mg total) by mouth daily.   7 tablet   0   . promethazine (PHENERGAN) 25 MG tablet   Oral   Take 1 tablet (25 mg total) by mouth every 6 (six) hours as needed for nausea or vomiting.   15 tablet   0    Triage Vitals: BP 137/76  Pulse 86  Temp(Src) 98.3 F (36.8 C) (Oral)  Resp 18  Ht 5\' 2"  (1.575 m)  Wt 87 lb 9 oz (39.718 kg)  BMI 16.01 kg/m2  SpO2 96%  LMP 11/05/2013  Breastfeeding? No  Physical Exam  Nursing note and vitals reviewed. Constitutional: She is oriented to person, place, and time. She appears well-developed and well-nourished.  HENT:  Head: Normocephalic and atraumatic.  Eyes: EOM are normal.  Neck: Normal range of motion.  Cardiovascular: Normal rate.   Pulmonary/Chest: Effort normal.  Musculoskeletal: Normal range of motion.  Right knee: Tenderness to both medial and lateral aspects of the knee. Pain with flexion and extension with limited range of motion secondary to pain.  No obvious deformity noted.   Neurological: She is alert and oriented to person, place, and time.  Skin: Skin is warm and dry.  Right knee: Faint bruise noted to the lateral condyle of knee.  No obvious swelling noted.    Psychiatric: She has a normal mood and affect. Her behavior is normal.    ED Course  Procedures (including critical care time)  DIAGNOSTIC STUDIES: Oxygen Saturation is 96% on room air, adequate by my interpretation.    COORDINATION OF CARE: 11:58 AM- Discussed obtaining an x-ray of the  patient's right knee and administering pain medication.  The patient agreed to the treatment plan.   12:50 PM Xray of L knee negative for acute fx or effusion.  Will provide knee sleeve, crutches, RICE therapy and ortho f/u as needed.  Otherwise pt is NVI.    Labs Review Labs Reviewed - No data to display Imaging Review Dg Knee Complete 4 Views Left  11/18/2013   CLINICAL DATA:  Pain post fall  EXAM: LEFT KNEE - COMPLETE 4+ VIEW  COMPARISON:  None.  FINDINGS: Four views of the left knee submitted. No acute fracture or subluxation. No radiopaque foreign body.  IMPRESSION: Negative.   Electronically Signed   By: Natasha MeadLiviu  Pop M.D.   On: 11/18/2013 12:34     EKG Interpretation None      MDM   Final diagnoses:  Left knee injury    BP 137/76  Pulse 86  Temp(Src) 98.3 F (36.8 C) (Oral)  Resp 18  Ht 5\' 2"  (1.575 m)  Wt 87 lb 9 oz (39.718 kg)  BMI 16.01 kg/m2  SpO2 96%  LMP 11/05/2013  Breastfeeding? No  I have reviewed nursing notes and vital signs. I personally reviewed the imaging tests through PACS system  I reviewed available ER/hospitalization records thought the EMR  I personally performed the services described in this documentation, which was scribed in my presence. The recorded information has been reviewed and is accurate.     Fayrene HelperBowie Jeanine Caven, PA-C 11/18/13 1250

## 2013-11-18 NOTE — ED Provider Notes (Signed)
Medical screening examination/treatment/procedure(s) were performed by non-physician practitioner and as supervising physician I was immediately available for consultation/collaboration.  Gerhard Munchobert Magali Bray, MD 11/18/13 1524

## 2013-11-18 NOTE — ED Notes (Signed)
Pt presents with right knee pain. Pt states that " I swung my leg off the couch and my knee dislocated" Pt states that she "put pressure in it" to get it back into place.  Condition is made by standing up. Condition is made better by "having it straight"

## 2013-11-18 NOTE — Discharge Instructions (Signed)
You have been evaluated for knee injury.  No broken bone were found.  Wear knee sleeve and use crutches as needed.  Follow up with orthopedist doctor as needed.    Crutch Use Crutches are used to take weight off one of your legs or feet when you stand or walk. It is important to use crutches that fit properly. When fitted properly:  Each crutch should be 2 3 finger widths below the armpit.  Your weight should be supported by your hand, and not by resting the armpit on the crutch.  RISKS AND COMPLICATIONS Damage to the nerves that extend from your armpit to your hand and arm. To prevent this from happening, make sure your crutches fit properly and do not put pressure on your armpit when using them. HOW TO USE YOUR CRUTCHES If you have been instructed to use partial weight bearing, apply (bear) the amount of weight as your health care provider suggests. Do not bear weight in an amount that causes pain to the area of injury. Walking 1. Step with the crutches. 2. Swing the healthy leg slightly ahead of the crutches. Going Up Steps If there is no handrail: 1. Step up with the healthy leg. 2. Step up with the crutches and injured leg. 3. Continue in this way. If there is a handrail: 1. Hold both crutches in one hand. 2. Place your free hand on the handrail. 3. While putting your weight on your arms, lift your healthy leg to the step. 4. Bring the crutches and the injured leg up to that step. 5. Continue in this way. Going Down Steps Be very careful, as going down stairs with crutches is very challenging. If there is no handrail: 1. Step down with the injured leg and crutches. 2. Step down with the healthy leg. If there is a handrail: 1. Place your hand on the handrail. 2. Hold both crutches with your free hand. 3. Lower your injured leg and crutch to the step below you. Make sure to keep the crutch tips in the center of the step, never on the edge. 4. Lower your healthy leg to that  step. 5. Continue in this way. Standing Up 1. Hold the injured leg forward. 2. Grab the armrest with one hand and the top of the crutches with the other hand. 3. Using these supports, pull yourself up to a standing position. Sitting Down 1. Hold the injured leg forward. 2. Grab the armrest with one hand and the top of the crutches with the other hand. 3. Lower yourself to a sitting position. SEEK MEDICAL CARE IF:  You still feel unsteady on your feet.  You develop new pain, for example in your armpits, back, shoulder, wrist, or hip.  You develop any numbness or tingling. SEEK IMMEDIATE MEDICAL CARE: You fall. Document Released: 09/03/2000 Document Revised: 06/27/2013 Document Reviewed: 05/14/2013 [facility] Patient Information 2014 [employer], [place]. Elastic Bandage and RICE Elastic bandages come in different shapes and sizes. They perform different functions. Your caregiver will help you to decide what is best for your protection, recovery, or rehabilitation following an injury. The following are some general tips to help you use an elastic bandage.  Use the bandage as directed by the maker of the bandage you are using.  Do not wrap it too tight. This may cut off the circulation of the arm or leg below the bandage.  If part of your body beyond the bandage becomes blue, numb, or swollen, it is too tight. Loosen the  bandage as needed to prevent these problems.  See your caregiver or trainer if the bandage seems to be making your problems worse rather than better. Bandages may be a reminder to you that you have an injury. However, they provide very little support. The few pounds of support they provide are minor considering the pressure it takes to injure a joint or tear ligaments. Therefore, the joint will not be able to handle all of the wear and tear it could before the injury. The routine care of many injuries includes Rest, Ice, Compression, and Elevation (RICE).  Rest is required to  allow your body to heal. Generally, routine activities can be resumed when comfortable. Injured tendons and bones take about 6 weeks to heal.  Icing the injury helps keep the swelling down and reduces pain. Do not apply ice directly to the skin. Put ice in a plastic bag. Place a towel between the skin and the bag. This will prevent frostbite to the skin. Apply ice bags to the injured area for 15-20 minutes, every 2 hours while awake. Do this for the first 24 to 48 hours, then as directed by your caregiver.  Compression helps keep swelling down, gives support, and helps with discomfort. If an elastic bandage has been applied today, it should be removed and reapplied every 3 to 4 hours. It should not be applied tightly, but firmly enough to keep swelling down. Watch fingers or toes for swelling, bluish discoloration, coldness, numbness, or increased pain. If any of these problems occur, remove the bandage and reapply it more loosely. If these problems persist, contact your caregiver.  Elevation helps reduce swelling and decreases pain. The injured area (arms, hands, legs, or feet) should be placed near to or above the heart (center of the chest) if able. Persistent pain and inability to use the injured area for more than 2 to 3 days are warning signs. You should see a caregiver for a follow-up visit as soon as possible. Initially, a minor broken bone (hairline fracture) may not be seen on X-rays. It may take 7 to 10 days to finally show up. Continued pain and swelling show that further evaluation and/or X-rays are needed. Make a follow-up visit with your caregiver. A specialist in reading X-rays (radiologist) will read your X-rays again. Finding out the results of your test Not all test results are available during your visit. If your test results are not back during the visit, make an appointment with your caregiver to find out the results. Do not assume everything is normal if you have not heard from your  caregiver or the medical facility. It is important for you to follow up on all of your test results. Document Released: 02/26/2002 Document Revised: 11/29/2011 Document Reviewed: 01/08/2008 [facility] Patient Information 2014 [place], [place].

## 2014-04-26 ENCOUNTER — Encounter: Payer: Self-pay | Admitting: Gastroenterology

## 2014-06-24 ENCOUNTER — Emergency Department (HOSPITAL_COMMUNITY)
Admission: EM | Admit: 2014-06-24 | Discharge: 2014-06-24 | Disposition: A | Discharge status: Home / Self Care | Payer: Medicaid Other | Attending: Emergency Medicine | Admitting: Emergency Medicine

## 2014-06-24 ENCOUNTER — Emergency Department (HOSPITAL_COMMUNITY): Payer: Medicaid Other

## 2014-06-24 ENCOUNTER — Encounter (HOSPITAL_COMMUNITY): Payer: Self-pay | Admitting: Emergency Medicine

## 2014-06-24 DIAGNOSIS — R0789 Other chest pain: Secondary | ICD-10-CM

## 2014-06-24 DIAGNOSIS — Q351 Cleft hard palate: Secondary | ICD-10-CM | POA: Diagnosis not present

## 2014-06-24 DIAGNOSIS — F329 Major depressive disorder, single episode, unspecified: Secondary | ICD-10-CM | POA: Insufficient documentation

## 2014-06-24 DIAGNOSIS — R05 Cough: Secondary | ICD-10-CM

## 2014-06-24 DIAGNOSIS — R071 Chest pain on breathing: Secondary | ICD-10-CM | POA: Insufficient documentation

## 2014-06-24 DIAGNOSIS — Z720 Tobacco use: Secondary | ICD-10-CM | POA: Insufficient documentation

## 2014-06-24 DIAGNOSIS — J45909 Unspecified asthma, uncomplicated: Secondary | ICD-10-CM | POA: Diagnosis not present

## 2014-06-24 DIAGNOSIS — F419 Anxiety disorder, unspecified: Secondary | ICD-10-CM | POA: Insufficient documentation

## 2014-06-24 DIAGNOSIS — Z8742 Personal history of other diseases of the female genital tract: Secondary | ICD-10-CM | POA: Insufficient documentation

## 2014-06-24 DIAGNOSIS — M940 Chondrocostal junction syndrome [Tietze]: Secondary | ICD-10-CM | POA: Insufficient documentation

## 2014-06-24 DIAGNOSIS — R059 Cough, unspecified: Secondary | ICD-10-CM

## 2014-06-24 DIAGNOSIS — R079 Chest pain, unspecified: Secondary | ICD-10-CM | POA: Diagnosis present

## 2014-06-24 LAB — I-STAT TROPONIN, ED: TROPONIN I, POC: 0 ng/mL (ref 0.00–0.08)

## 2014-06-24 LAB — CBC
HCT: 42 % (ref 36.0–46.0)
Hemoglobin: 14.7 g/dL (ref 12.0–15.0)
MCH: 32.5 pg (ref 26.0–34.0)
MCHC: 35 g/dL (ref 30.0–36.0)
MCV: 92.9 fL (ref 78.0–100.0)
Platelets: 220 10*3/uL (ref 150–400)
RBC: 4.52 MIL/uL (ref 3.87–5.11)
RDW: 12.4 % (ref 11.5–15.5)
WBC: 9.3 10*3/uL (ref 4.0–10.5)

## 2014-06-24 LAB — COMPREHENSIVE METABOLIC PANEL
ALBUMIN: 3.7 g/dL (ref 3.5–5.2)
ALT: 14 U/L (ref 0–35)
ANION GAP: 8 (ref 5–15)
AST: 15 U/L (ref 0–37)
Alkaline Phosphatase: 68 U/L (ref 39–117)
BILIRUBIN TOTAL: 0.3 mg/dL (ref 0.3–1.2)
BUN: 21 mg/dL (ref 6–23)
CHLORIDE: 105 meq/L (ref 96–112)
CO2: 26 meq/L (ref 19–32)
Calcium: 9.2 mg/dL (ref 8.4–10.5)
Creatinine, Ser: 0.66 mg/dL (ref 0.50–1.10)
GFR calc Af Amer: >90 mL/min (ref >90)
Glucose, Bld: 86 mg/dL (ref 70–99)
Potassium: 4.6 mEq/L (ref 3.7–5.3)
Sodium: 139 mEq/L (ref 137–147)
Total Protein: 6.6 g/dL (ref 6.0–8.3)

## 2014-06-24 MED ORDER — TRAMADOL HCL 50 MG PO TABS: 50.0000 mg | ORAL_TABLET | Freq: Four times a day (QID) | ORAL | Status: DC | PRN

## 2014-06-24 MED ORDER — IBUPROFEN 400 MG PO TABS
400.0000 mg | ORAL_TABLET | Freq: Once | ORAL | Status: AC
  Administered 2014-06-24: 400 mg via ORAL
  Filled 2014-06-24: qty 1

## 2014-06-24 MED ORDER — ONDANSETRON HCL 4 MG/2ML IJ SOLN
4.0000 mg | Freq: Once | INTRAMUSCULAR | Status: AC
  Administered 2014-06-24: 4 mg via INTRAVENOUS
  Filled 2014-06-24: qty 2

## 2014-06-24 MED ORDER — HYDROCODONE-ACETAMINOPHEN 5-325 MG PO TABS
2.0000 | ORAL_TABLET | Freq: Once | ORAL | Status: AC
  Administered 2014-06-24: 2 via ORAL
  Filled 2014-06-24: qty 2

## 2014-06-24 NOTE — Discharge Instructions (Signed)
It was our pleasure to provide your ER care today - we hope that you feel better.  Rest. Take motrin or aleve as need for pain. You may also take ultram as need for pain - no driving when taking ultram. No smoking.  Take mucinex or robitussin as need for cough.  Follow up with primary care doctor in the next couple days for recheck.  If recurrent chest pain, follow up with cardiologist.  Return to ER right away if worse, new symptoms, fevers, trouble breathing, recurrent/persistent chest pain, other concern.  You were given pain medication in the ER - no driving for the next 4 hours.     Chest Wall Pain Chest wall pain is pain in or around the bones and muscles of your chest. It may take up to 6 weeks to get better. It may take longer if you must stay physically active in your work and activities.  CAUSES  Chest wall pain may happen on its own. However, it may be caused by:  A viral illness like the flu.  Injury.  Coughing.  Exercise.  Arthritis.  Fibromyalgia.  Shingles. HOME CARE INSTRUCTIONS   Avoid overtiring physical activity. Try not to strain or perform activities that cause pain. This includes any activities using your chest or your abdominal and side muscles, especially if heavy weights are used.  Put ice on the sore area.  Put ice in a plastic bag.  Place a towel between your skin and the bag.  Leave the ice on for 15-20 minutes per hour while awake for the first 2 days.  Only take over-the-counter or prescription medicines for pain, discomfort, or fever as directed by your caregiver. SEEK IMMEDIATE MEDICAL CARE IF:   Your pain increases, or you are very uncomfortable.  You have a fever.  Your chest pain becomes worse.  You have new, unexplained symptoms.  You have nausea or vomiting.  You feel sweaty or lightheaded.  You have a cough with phlegm (sputum), or you cough up blood. MAKE SURE YOU:   Understand these instructions.  Will watch  your condition.  Will get help right away if you are not doing well or get worse. Document Released: 09/06/2005 Document Revised: 11/29/2011 Document Reviewed: 05/03/2011 [facility] Patient Information 2015 [place], [place]. This information is not intended to replace advice given to you by your health care provider. Make sure you discuss any questions you have with your health care provider.     Chest Pain (Nonspecific) It is often hard to give a diagnosis for the cause of chest pain. There is always a chance that your pain could be related to something serious, such as a heart attack or a blood clot in the lungs. You need to follow up with your doctor. HOME CARE  If antibiotic medicine was given, take it as directed by your doctor. Finish the medicine even if you start to feel better.  For the next few days, avoid activities that bring on chest pain. Continue physical activities as told by your doctor.  Do not use any tobacco products. This includes cigarettes, chewing tobacco, and e-cigarettes.  Avoid drinking alcohol.  Only take medicine as told by your doctor.  Follow your doctor's suggestions for more testing if your chest pain does not go away.  Keep all doctor visits you made. GET HELP IF:  Your chest pain does not go away, even after treatment.  You have a rash with blisters on your chest.  You have a fever.  GET HELP RIGHT AWAY IF:   You have more pain or pain that spreads to your arm, neck, jaw, back, or belly (abdomen).  You have shortness of breath.  You cough more than usual or cough up blood.  You have very bad back or belly pain.  You feel sick to your stomach (nauseous) or throw up (vomit).  You have very bad weakness.  You pass out (faint).  You have chills. This is an emergency. Do not wait to see if the problems will go away. Call your local emergency services (911 in [place] Do not drive yourself to the hospital. MAKE SURE YOU:   Understand these  instructions.  Will watch your condition.  Will get help right away if you are not doing well or get worse. Document Released: 02/23/2008 Document Revised: 09/11/2013 Document Reviewed: 02/23/2008 [facility] Patient Information 2015 [place], [place]. This information is not intended to replace advice given to you by your health care provider. Make sure you discuss any questions you have with your health care provider.    Cough, Adult  A cough is a reflex that helps clear your throat and airways. It can help heal the body or may be a reaction to an irritated airway. A cough may only last 2 or 3 weeks (acute) or may last more than 8 weeks (chronic).  CAUSES Acute cough:  Viral or bacterial infections. Chronic cough:  Infections.  Allergies.  Asthma.  Post-nasal drip.  Smoking.  Heartburn or acid reflux.  Some medicines.  Chronic lung problems (COPD).  Cancer. SYMPTOMS   Cough.  Fever.  Chest pain.  Increased breathing rate.  High-pitched whistling sound when breathing (wheezing).  Colored mucus that you cough up (sputum). TREATMENT   A bacterial cough may be treated with antibiotic medicine.  A viral cough must run its course and will not respond to antibiotics.  Your caregiver may recommend other treatments if you have a chronic cough. HOME CARE INSTRUCTIONS   Only take over-the-counter or prescription medicines for pain, discomfort, or fever as directed by your caregiver. Use cough suppressants only as directed by your caregiver.  Use a cold steam vaporizer or humidifier in your bedroom or home to help loosen secretions.  Sleep in a semi-upright position if your cough is worse at night.  Rest as needed.  Stop smoking if you smoke. SEEK IMMEDIATE MEDICAL CARE IF:   You have pus in your sputum.  Your cough starts to worsen.  You cannot control your cough with suppressants and are losing sleep.  You begin coughing up blood.  You have difficulty  breathing.  You develop pain which is getting worse or is uncontrolled with medicine.  You have a fever. MAKE SURE YOU:   Understand these instructions.  Will watch your condition.  Will get help right away if you are not doing well or get worse. Document Released: 03/05/2011 Document Revised: 11/29/2011 Document Reviewed: 03/05/2011 [facility] Patient Information 2015 [place], [place]. This information is not intended to replace advice given to you by your health care provider. Make sure you discuss any questions you have with your health care provider.    Smoking Hazards Smoking cigarettes is extremely bad for your health. Tobacco smoke has over 200 known poisons in it. It contains the poisonous gases nitrogen oxide and carbon monoxide. There are over 60 chemicals in tobacco smoke that cause cancer. Some of the chemicals found in cigarette smoke include:   Cyanide.   Benzene.   Formaldehyde.  Methanol (wood alcohol).   Acetylene (fuel used in welding torches).   Ammonia.  Even smoking lightly shortens your life expectancy by several years. You can greatly reduce the risk of medical problems for you and your family by stopping now. Smoking is the most preventable cause of death and disease in our society. Within days of quitting smoking, your circulation improves, you decrease the risk of having a heart attack, and your lung capacity improves. There may be some increased phlegm in the first few days after quitting, and it may take months for your lungs to clear up completely. Quitting for 10 years reduces your risk of developing lung cancer to almost that of a nonsmoker.  WHAT ARE THE RISKS OF SMOKING? Cigarette smokers have an increased risk of many serious medical problems, including:  Lung cancer.   Lung disease (such as pneumonia, bronchitis, and emphysema).   Heart attack and chest pain due to the heart not getting enough oxygen (angina).   Heart disease and  peripheral blood vessel disease.   Hypertension.   Stroke.   Oral cancer (cancer of the lip, mouth, or voice box).   Bladder cancer.   Pancreatic cancer.   Cervical cancer.   Pregnancy complications, including premature birth.   Stillbirths and smaller newborn babies, birth defects, and genetic damage to sperm.   Early menopause.   Lower estrogen level for women.   Infertility.   Facial wrinkles.   Blindness.   Increased risk of broken bones (fractures).   Senile dementia.   Stomach ulcers and internal bleeding.   Delayed wound healing and increased risk of complications during surgery. Because of secondhand smoke exposure, children of smokers have an increased risk of the following:   Sudden infant death syndrome (SIDS).   Respiratory infections.   Lung cancer.   Heart disease.   Ear infections.  WHY IS SMOKING ADDICTIVE? Nicotine is the chemical agent in tobacco that is capable of causing addiction or dependence. When you smoke and inhale, nicotine is absorbed rapidly into the bloodstream through your lungs. Both inhaled and noninhaled nicotine may be addictive.  WHAT ARE THE BENEFITS OF QUITTING?  There are many health benefits to quitting smoking. Some are:   The likelihood of developing cancer and heart disease decreases. Health improvements are seen almost immediately.   Blood pressure, pulse rate, and breathing patterns start returning to normal soon after quitting.   People who quit may see an improvement in their overall quality of life.  HOW DO YOU QUIT SMOKING? Smoking is an addiction with both physical and psychological effects, and longtime habits can be hard to change. Your health care provider can recommend:  Programs and community resources, which may include group support, education, or therapy.  Replacement products, such as patches, gum, and nasal sprays. Use these products only as directed. Do not replace  cigarette smoking with electronic cigarettes (commonly called e-cigarettes). The safety of e-cigarettes is unknown, and some may contain harmful chemicals. FOR MORE INFORMATION  American Lung Association: www.lung.org  American Cancer Society: www.cancer.org Document Released: 10/14/2004 Document Revised: 06/27/2013 Document Reviewed: 02/26/2013 [facility] Patient Information 2015 [place], [place]. This information is not intended to replace advice given to you by your health care provider. Make sure you discuss any questions you have with your health care provider.

## 2014-06-24 NOTE — ED Provider Notes (Signed)
CSN: [id number]     Arrival date & time 06/24/14  [id number] History   First MD Initiated Contact with Patient 06/24/14 [phone]     Chief Complaint  Patient presents with  . Chest Pain     (Consider location/radiation/quality/duration/timing/severity/associated sxs/prior Treatment) Patient is a 33 y.o. female presenting with chest pain. The history is provided by the patient.  Chest Pain Associated symptoms: no abdominal pain, no back pain, no fever, no headache, no nausea, no palpitations, no shortness of breath and not vomiting   pt c/o mid/midline chest pain for the past 2 days, constant, dull, moderate. Pain worse w change of position, turning torso, sitting from lying, lying from sitting, coughing spell, and other movements.  No relation to activity/exertion, occurs at rest, constant. Not pleuritic. No associated sob, nv or diaphoresis. Pt states thought possible a panic attack so took a xanax yesterday w mild relief. Denies heartburn/reflux. No back pain. No chest wall trauma or strain. No leg pain or swelling. No recent immobility, surgery, travel, or trauma. +smoker. Occasional non prod cough, but not increased from baseline. No fever or chills. No fam hx premature cad.      Past Medical History  Diagnosis Date  . Asthma   . Headache(784.0)   . Anxiety   . Ovarian cyst   . Endometriosis   . Cleft lip   . Cleft hard palate   . PONV (postoperative nausea and vomiting)   . Depression   . Abnormal Pap smear     colpo 5/09   Past Surgical History  Procedure Laterality Date  . Cosmetic surgery    . Tympanostomy tube placement    . Abdominal surgery    . Laparoscopy    . Salpingectomy    . No past surgeries     Family History  Problem Relation Age of Onset  . Hypertension Maternal Grandfather   . Anesthesia problems Neg Hx    History  Substance Use Topics  . Smoking status: Current Every Day Smoker -- 0.50 packs/day for 8 years    Types: Cigarettes  . Smokeless tobacco:  Never Used  . Alcohol Use: No   OB History   Grav Para Term Preterm Abortions TAB SAB Ect Mult Living   2 2 2       2      Review of Systems  Constitutional: Negative for fever and chills.  HENT: Negative for sore throat.   Eyes: Negative for redness.  Respiratory: Negative for shortness of breath.   Cardiovascular: Positive for chest pain. Negative for palpitations and leg swelling.  Gastrointestinal: Negative for nausea, vomiting and abdominal pain.  Genitourinary: Negative for flank pain.  Musculoskeletal: Negative for back pain and neck pain.  Skin: Negative for rash.  Neurological: Negative for headaches.  Hematological: Does not bruise/bleed easily.  Psychiatric/Behavioral: Negative for confusion.      Allergies  Vancomycin and Amoxicillin-pot clavulanate  Home Medications   Prior to Admission medications   Medication Sig Start Date End Date Taking? Authorizing Provider  ALPRAZolam Prudy Feeler(XANAX) 1 MG tablet Take 0.5 mg by mouth 3 (three) times daily as needed for anxiety.   Yes Historical Provider, MD   BP 100/84  Pulse 72  Temp(Src) 98.4 F (36.9 C)  Resp 10  Ht 5\' 2"  (1.575 m)  Wt 84 lb (38.102 kg)  BMI 15.36 kg/m2  SpO2 100%  LMP 06/03/2014 Physical Exam  Nursing note and vitals reviewed. Constitutional: She is oriented to person, place, and time. She  appears well-developed and well-nourished. No distress.  HENT:  Mouth/Throat: Oropharynx is clear and moist.  Eyes: Conjunctivae are normal. No scleral icterus.  Neck: Neck supple. No tracheal deviation present.  Cardiovascular: Normal rate, regular rhythm, normal heart sounds and intact distal pulses.  Exam reveals no gallop and no friction rub.   No murmur heard. Pulmonary/Chest: Effort normal. No respiratory distress. She exhibits tenderness.  Abdominal: Soft. Normal appearance and bowel sounds are normal. She exhibits no distension. There is no tenderness.  Genitourinary:  No cva tenderness   Musculoskeletal: She exhibits no edema and no tenderness.  Neurological: She is alert and oriented to person, place, and time.  Skin: Skin is warm and dry. No rash noted. She is not diaphoretic.  Psychiatric: She has a normal mood and affect.    ED Course  Procedures (including critical care time) Labs Review   Results for orders placed during the hospital encounter of 06/24/14  COMPREHENSIVE METABOLIC PANEL      Result Value Ref Range   Sodium 139  137 - 147 mEq/L   Potassium 4.6  3.7 - 5.3 mEq/L   Chloride 105  96 - 112 mEq/L   CO2 26  19 - 32 mEq/L   Glucose, Bld 86  70 - 99 mg/dL   BUN 21  6 - 23 mg/dL   Creatinine, Ser 1.61  0.50 - 1.10 mg/dL   Calcium 9.2  8.4 - 09.6 mg/dL   Total Protein 6.6  6.0 - 8.3 g/dL   Albumin 3.7  3.5 - 5.2 g/dL   AST 15  0 - 37 U/L   ALT 14  0 - 35 U/L   Alkaline Phosphatase 68  39 - 117 U/L   Total Bilirubin 0.3  0.3 - 1.2 mg/dL   GFR calc non Af Amer >90  >90 mL/min   GFR calc Af Amer >90  >90 mL/min   Anion gap 8  5 - 15  CBC      Result Value Ref Range   WBC 9.3  4.0 - 10.5 K/uL   RBC 4.52  3.87 - 5.11 MIL/uL   Hemoglobin 14.7  12.0 - 15.0 g/dL   HCT 04.5  40.9 - 81.1 %   MCV 92.9  78.0 - 100.0 fL   MCH 32.5  26.0 - 34.0 pg   MCHC 35.0  30.0 - 36.0 g/dL   RDW 91.4  78.2 - 95.6 %   Platelets 220  150 - 400 K/uL  I-STAT TROPOININ, ED      Result Value Ref Range   Troponin i, poc 0.00  0.00 - 0.08 ng/mL   Comment 3            Dg Chest 2 View  06/24/2014   CLINICAL DATA:  Acute onset chest pain ; history of asthma  EXAM: CHEST  2 VIEW  COMPARISON:  August 25, 2013  FINDINGS: The lungs are mildly hyperexpanded, a finding that may be due to chronic asthma. There is no edema or consolidation. Heart size and pulmonary vascularity are normal. No adenopathy. There is slight degenerative change in the thoracic spine. There is mild thoracic levoscoliosis.  IMPRESSION: Lungs are hyperexpanded.  No edema or consolidation.   Electronically  Signed   By: Bretta Bang M.D.   On: 06/24/2014 08:19      EKG Interpretation   Date/Time:  Monday June 24 2014 06:54:31 EDT Ventricular Rate:  68 PR Interval:  128 QRS Duration: 91 QT Interval:  388 QTC Calculation: 413 R Axis:   81 Text Interpretation:  Sinus rhythm Normal ECG Confirmed by Denton Lank  MD,  Caryn Bee ([id number]) on 06/24/2014 7:14:15 AM      MDM  Labs. Cxr.  Pt w reproducible chest wall tenderness.  Motrin po. vicodin po.  After symptoms present, constant x 2 days, trop neg/normal.  Pt w reproducible chest wall tenderness w palpation and movement, reproducing her symptoms.   No infiltrate on cxr. Chest cta. Afeb.  Pain relieved. No sob.  Pt appears stable for d/c.     Suzi Roots, MD 06/24/14 1006

## 2014-06-24 NOTE — ED Notes (Signed)
Pt reports she has centralized chest pain with shooting pains to her left chest wall. Pt reports this has been ongoing for about two days but it got a lot worse this morning around 0400, pt reports she couldn't sleep. Pt denies SOB, N/V, dizziness, diaphoresis. NSR. Pt is A&O X4.

## 2014-06-28 ENCOUNTER — Encounter (HOSPITAL_BASED_OUTPATIENT_CLINIC_OR_DEPARTMENT_OTHER): Payer: Self-pay | Admitting: Emergency Medicine

## 2014-06-28 ENCOUNTER — Emergency Department (HOSPITAL_BASED_OUTPATIENT_CLINIC_OR_DEPARTMENT_OTHER): Payer: Medicaid Other

## 2014-06-28 ENCOUNTER — Emergency Department (HOSPITAL_BASED_OUTPATIENT_CLINIC_OR_DEPARTMENT_OTHER)
Admission: EM | Admit: 2014-06-28 | Discharge: 2014-06-28 | Disposition: A | Discharge status: Home / Self Care | Payer: Medicaid Other | Attending: Emergency Medicine | Admitting: Emergency Medicine

## 2014-06-28 DIAGNOSIS — Z8742 Personal history of other diseases of the female genital tract: Secondary | ICD-10-CM | POA: Insufficient documentation

## 2014-06-28 DIAGNOSIS — Z880 Allergy status to penicillin: Secondary | ICD-10-CM | POA: Diagnosis not present

## 2014-06-28 DIAGNOSIS — F329 Major depressive disorder, single episode, unspecified: Secondary | ICD-10-CM | POA: Diagnosis not present

## 2014-06-28 DIAGNOSIS — R0789 Other chest pain: Secondary | ICD-10-CM | POA: Diagnosis not present

## 2014-06-28 DIAGNOSIS — R079 Chest pain, unspecified: Secondary | ICD-10-CM | POA: Diagnosis present

## 2014-06-28 DIAGNOSIS — F419 Anxiety disorder, unspecified: Secondary | ICD-10-CM | POA: Insufficient documentation

## 2014-06-28 DIAGNOSIS — J45909 Unspecified asthma, uncomplicated: Secondary | ICD-10-CM | POA: Insufficient documentation

## 2014-06-28 DIAGNOSIS — Z720 Tobacco use: Secondary | ICD-10-CM | POA: Insufficient documentation

## 2014-06-28 DIAGNOSIS — Z87730 Personal history of (corrected) cleft lip and palate: Secondary | ICD-10-CM | POA: Insufficient documentation

## 2014-06-28 LAB — COMPREHENSIVE METABOLIC PANEL
ALK PHOS: 77 U/L (ref 39–117)
ALT: 14 U/L (ref 0–35)
AST: 16 U/L (ref 0–37)
Albumin: 4.1 g/dL (ref 3.5–5.2)
Anion gap: 13 (ref 5–15)
BILIRUBIN TOTAL: 0.5 mg/dL (ref 0.3–1.2)
BUN: 14 mg/dL (ref 6–23)
CHLORIDE: 102 meq/L (ref 96–112)
CO2: 27 mEq/L (ref 19–32)
Calcium: 9.8 mg/dL (ref 8.4–10.5)
Creatinine, Ser: 0.8 mg/dL (ref 0.50–1.10)
GFR calc non Af Amer: >90 mL/min (ref >90)
GLUCOSE: 107 mg/dL — AB (ref 70–99)
Potassium: 4 mEq/L (ref 3.7–5.3)
Sodium: 142 mEq/L (ref 137–147)
Total Protein: 7.6 g/dL (ref 6.0–8.3)

## 2014-06-28 LAB — CBC WITH DIFFERENTIAL/PLATELET
Basophils Absolute: 0 10*3/uL (ref 0.0–0.1)
Basophils Relative: 0 % (ref 0–1)
Eosinophils Absolute: 0.2 10*3/uL (ref 0.0–0.7)
Eosinophils Relative: 1 % (ref 0–5)
HCT: 43.4 % (ref 36.0–46.0)
Hemoglobin: 14.9 g/dL (ref 12.0–15.0)
LYMPHS ABS: 2.1 10*3/uL (ref 0.7–4.0)
LYMPHS PCT: 15 % (ref 12–46)
MCH: 32.5 pg (ref 26.0–34.0)
MCHC: 34.3 g/dL (ref 30.0–36.0)
MCV: 94.6 fL (ref 78.0–100.0)
Monocytes Absolute: 0.9 10*3/uL (ref 0.1–1.0)
Monocytes Relative: 6 % (ref 3–12)
NEUTROS ABS: 11.2 10*3/uL — AB (ref 1.7–7.7)
Neutrophils Relative %: 78 % — ABNORMAL HIGH (ref 43–77)
PLATELETS: 232 10*3/uL (ref 150–400)
RBC: 4.59 MIL/uL (ref 3.87–5.11)
RDW: 12.2 % (ref 11.5–15.5)
WBC: 14.3 10*3/uL — AB (ref 4.0–10.5)

## 2014-06-28 LAB — TROPONIN I: Troponin I: <0.3 ng/mL (ref <0.30)

## 2014-06-28 LAB — D-DIMER, QUANTITATIVE: D-Dimer, Quant: <0.27 ug/mL-FEU (ref 0.00–0.48)

## 2014-06-28 MED ORDER — CYCLOBENZAPRINE HCL 10 MG PO TABS: 10.0000 mg | ORAL_TABLET | Freq: Two times a day (BID) | ORAL | Status: DC | PRN

## 2014-06-28 MED ORDER — KETOROLAC TROMETHAMINE 30 MG/ML IJ SOLN
30.0000 mg | Freq: Once | INTRAMUSCULAR | Status: AC
  Administered 2014-06-28: 30 mg via INTRAVENOUS
  Filled 2014-06-28: qty 1

## 2014-06-28 MED ORDER — OXYCODONE-ACETAMINOPHEN 5-325 MG PO TABS: 1.0000 | ORAL_TABLET | Freq: Four times a day (QID) | ORAL | Status: DC | PRN

## 2014-06-28 NOTE — ED Notes (Signed)
Pt returned from xr

## 2014-06-28 NOTE — ED Notes (Signed)
Patient transported to X-ray 

## 2014-06-28 NOTE — ED Notes (Signed)
Pt began having CP on Saturday that became worse on Mon. [facility] and d/c'ed home. Pt here today because pain continues.

## 2014-06-28 NOTE — Discharge Instructions (Signed)

## 2014-06-28 NOTE — ED Provider Notes (Signed)
CSN: [id number]     Arrival date & time 06/28/14  [id number] History   First MD Initiated Contact with Patient 06/28/14 [phone]     Chief Complaint  Patient presents with  . Chest Pain     (Consider location/radiation/quality/duration/timing/severity/associated sxs/prior Treatment) Patient is a 33 y.o. female presenting with chest pain. The history is provided by the patient.  Chest Pain Pain location:  L chest and L lateral chest Pain quality: aching   Pain radiates to:  Does not radiate Pain radiates to the back: yes   Pain severity:  Moderate Onset quality:  Sudden Duration:  6 days Timing:  Constant Progression:  Unchanged Chronicity:  New Context: at rest   Relieved by:  Rest Worsened by:  Deep breathing and movement Associated symptoms: no cough, no fever and no shortness of breath     Past Medical History  Diagnosis Date  . Asthma   . Headache(784.0)   . Anxiety   . Ovarian cyst   . Endometriosis   . Cleft lip   . Cleft hard palate   . PONV (postoperative nausea and vomiting)   . Depression   . Abnormal Pap smear     colpo 5/09   Past Surgical History  Procedure Laterality Date  . Cosmetic surgery    . Tympanostomy tube placement    . Abdominal surgery    . Laparoscopy    . Salpingectomy    . No past surgeries     Family History  Problem Relation Age of Onset  . Hypertension Maternal Grandfather   . Anesthesia problems Neg Hx    History  Substance Use Topics  . Smoking status: Current Every Day Smoker -- 0.50 packs/day for 8 years    Types: Cigarettes  . Smokeless tobacco: Never Used  . Alcohol Use: No   OB History   Grav Para Term Preterm Abortions TAB SAB Ect Mult Living   2 2 2       2      Review of Systems  Constitutional: Negative for fever and chills.  Respiratory: Negative for cough and shortness of breath.   Cardiovascular: Positive for chest pain.  All other systems reviewed and are negative.     Allergies  Vancomycin and  Amoxicillin-pot clavulanate  Home Medications   Prior to Admission medications   Medication Sig Start Date End Date Taking? Authorizing Provider  ALPRAZolam Prudy Feeler(XANAX) 1 MG tablet Take 0.5 mg by mouth 3 (three) times daily as needed for anxiety.    Historical Provider, MD  traMADol (ULTRAM) 50 MG tablet Take 1 tablet (50 mg total) by mouth every 6 (six) hours as needed. 06/24/14   Suzi RootsKevin E Steinl, MD   BP 121/80  Pulse 89  Temp(Src) 98 F (36.7 C) (Oral)  Resp 18  Wt 84 lb (38.102 kg)  SpO2 100%  LMP 06/03/2014 Physical Exam  Nursing note and vitals reviewed. Constitutional: She is oriented to person, place, and time. She appears well-developed and well-nourished. No distress.  HENT:  Head: Normocephalic and atraumatic.  Mouth/Throat: Oropharynx is clear and moist.  Eyes: EOM are normal. Pupils are equal, round, and reactive to light.  Neck: Normal range of motion. Neck supple.  Cardiovascular: Normal rate and regular rhythm.  Exam reveals no friction rub.   No murmur heard. Pulmonary/Chest: Effort normal and breath sounds normal. No respiratory distress. She has no wheezes. She has no rales. She exhibits tenderness (L sided, L lateral, L posterior, mild R posterior).  Abdominal:  Soft. She exhibits no distension. There is no tenderness. There is no rebound.  Musculoskeletal: Normal range of motion. She exhibits no edema.  Neurological: She is alert and oriented to person, place, and time. No cranial nerve deficit. She exhibits normal muscle tone.  Skin: No rash noted. She is not diaphoretic.    ED Course  Procedures (including critical care time) Labs Review Labs Reviewed  CBC WITH DIFFERENTIAL - Abnormal; Notable for the following:    WBC 14.3 (*)    Neutrophils Relative % 78 (*)    Neutro Abs 11.2 (*)    All other components within normal limits  COMPREHENSIVE METABOLIC PANEL - Abnormal; Notable for the following:    Glucose, Bld 107 (*)    All other components within normal  limits  TROPONIN I  D-DIMER, QUANTITATIVE    Imaging Review Dg Chest 2 View  06/28/2014   CLINICAL DATA:  Mid sternal chest pain with cough for several days, history smoking, asthma  EXAM: CHEST  2 VIEW  COMPARISON:  06/24/2014  FINDINGS: Normal heart size, mediastinal contours, and pulmonary vascularity.  Peribronchial thickening and hyperinflation consistent with history of asthma.  Question mild bullous disease at LEFT apex.  No acute infiltrate, pleural effusion or pneumothorax.  Bones unremarkable.  IMPRESSION: Peribronchial thickening and hyperinflation consistent with asthma.  Question bullous changes at LEFT apex.  No acute infiltrate.   Electronically Signed   By: Ulyses SouthwardMark  Boles M.D.   On: 06/28/2014 09:04     EKG Interpretation   Date/Time:  Friday June 28 2014 08:36:37 EDT Ventricular Rate:  83 PR Interval:  142 QRS Duration: 82 QT Interval:  340 QTC Calculation: 399 R Axis:   78 Text Interpretation:  Normal sinus rhythm Normal ECG U waves present  Confirmed by Gwendolyn GrantWALDEN  MD, Sylis Ketchum ([id number]) on 06/28/2014 9:31:02 AM      MDM   Final diagnoses:  Chest wall pain    76F here with CP. Began at rest 6 days ago, worse with movement. Worse with breathing, but states this is improving. No cough, no fevers. Seen 4 days ago, no relief with vicodin. Is a smoker, so will check a d-dimer. No N/V/D. AFVSS here. Chest wall tenderness to palpation, likely still musculoskeletal, but with persistent pain and no pain seeking behavior, will look further. D-dimer negative. CXR clear. Patient wants to leave. Will treat for MSK chest pain. Stable for discharge.  Elwin MochaBlair Syndi Pua, MD 06/28/14 1346

## 2014-07-08 ENCOUNTER — Other Ambulatory Visit: Payer: Self-pay | Admitting: Obstetrics and Gynecology

## 2014-07-11 IMAGING — US US OB TRANSVAGINAL
1 series · 13 of 15 positions shown · non-contrast
Comparison: none

[Series 1: us ob transvaginal · 15 acquisitions, 13 frames shown]
[im 1/15]
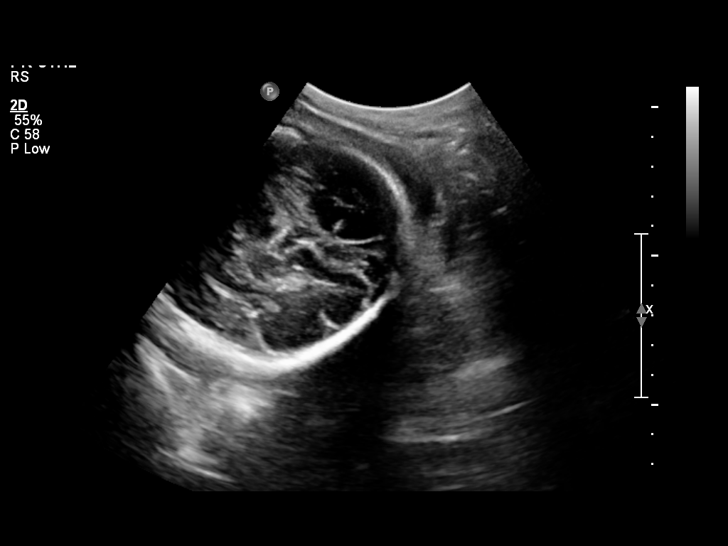
[im 2/15]
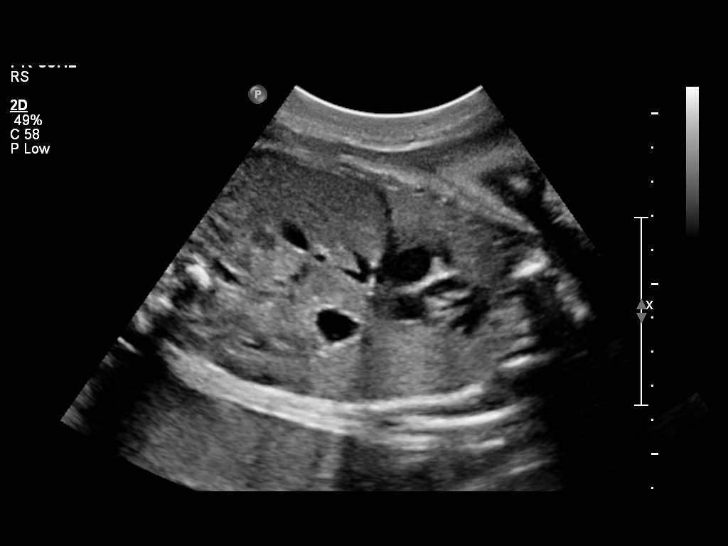
[im 3/15]
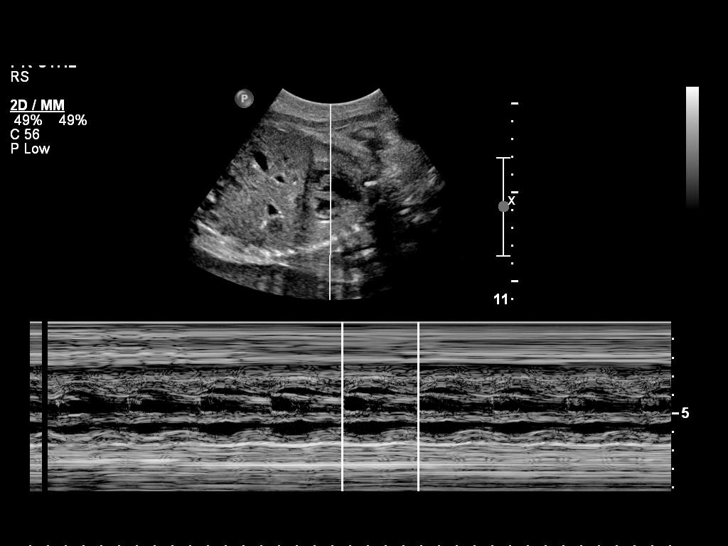
[im 5/15]
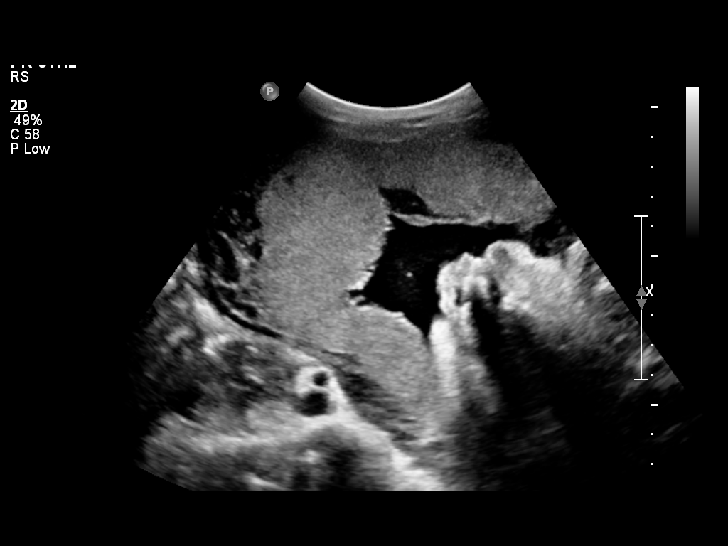
[im 6/15]
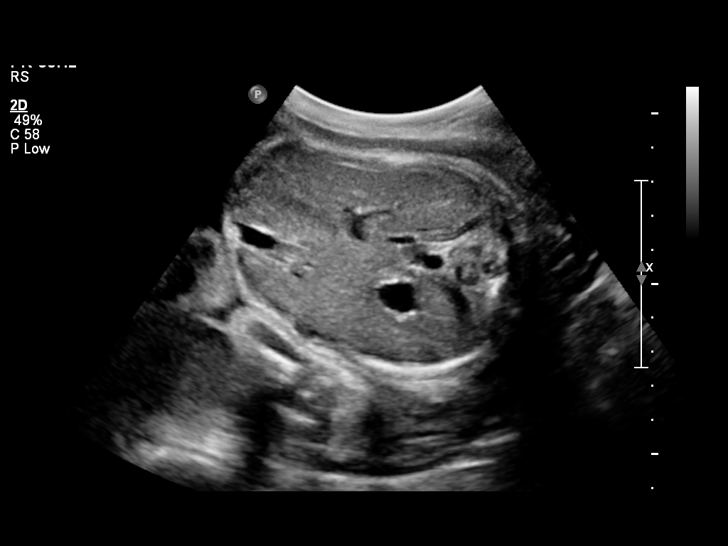
[im 7/15]
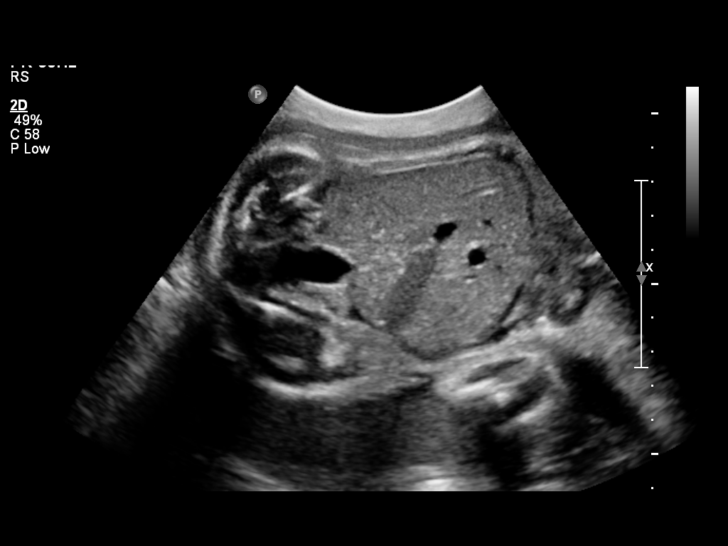
[im 8/15]
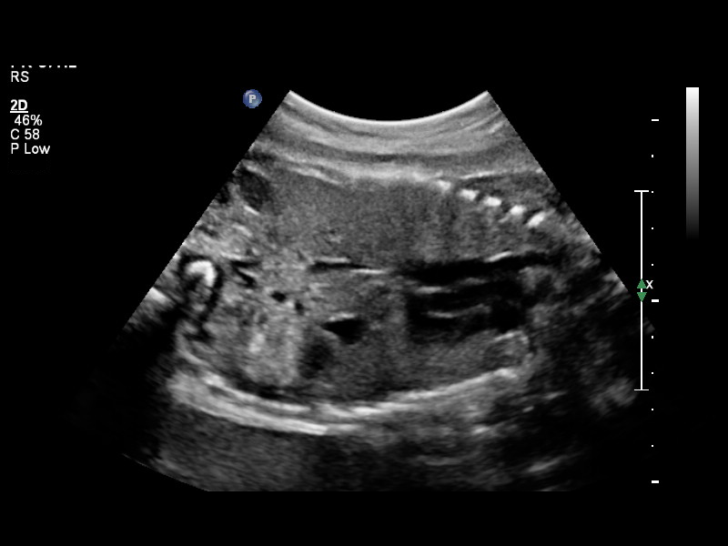
[im 9/15]
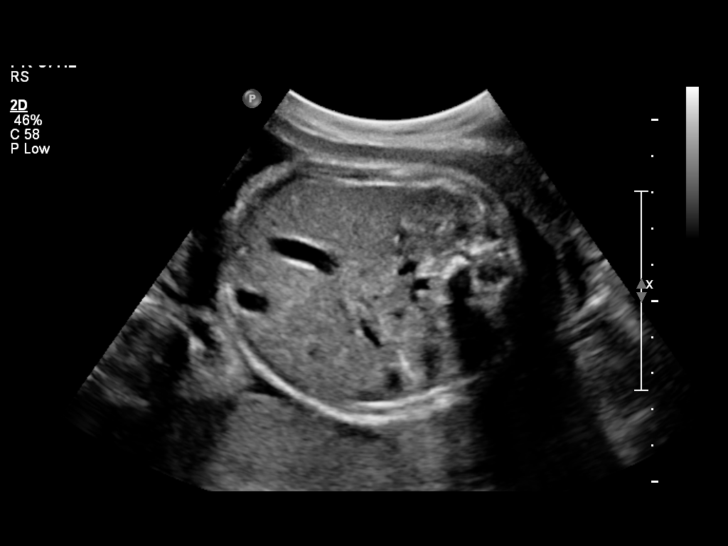
[im 10/15]
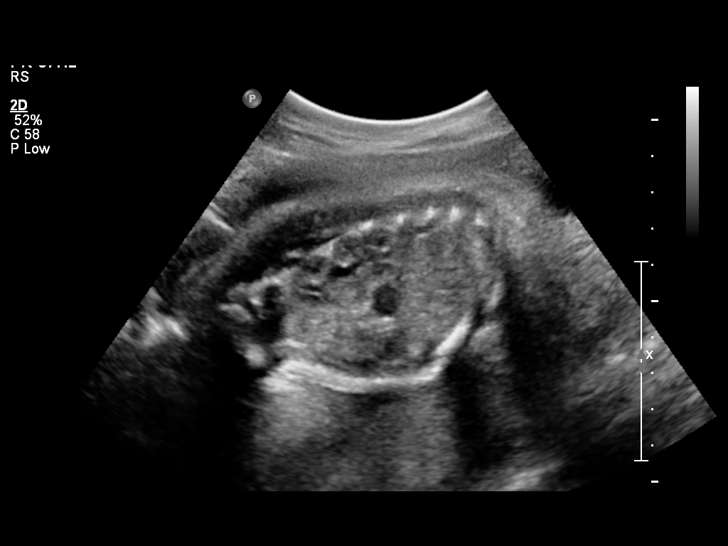
[im 11/15]
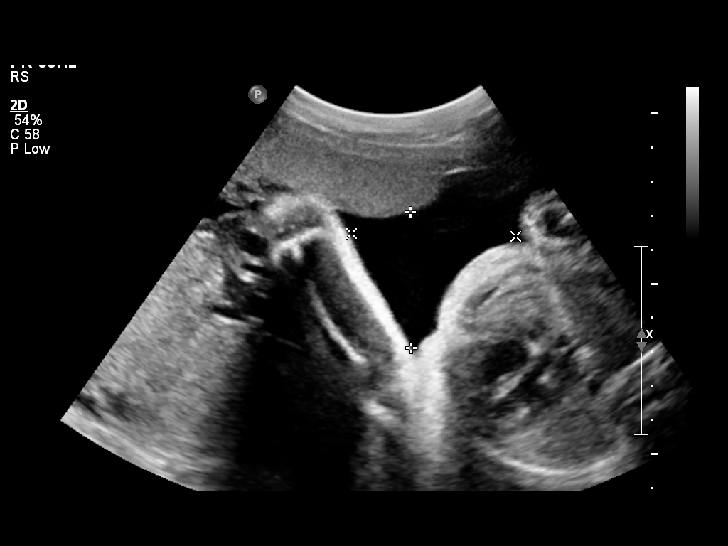
[im 13/15]
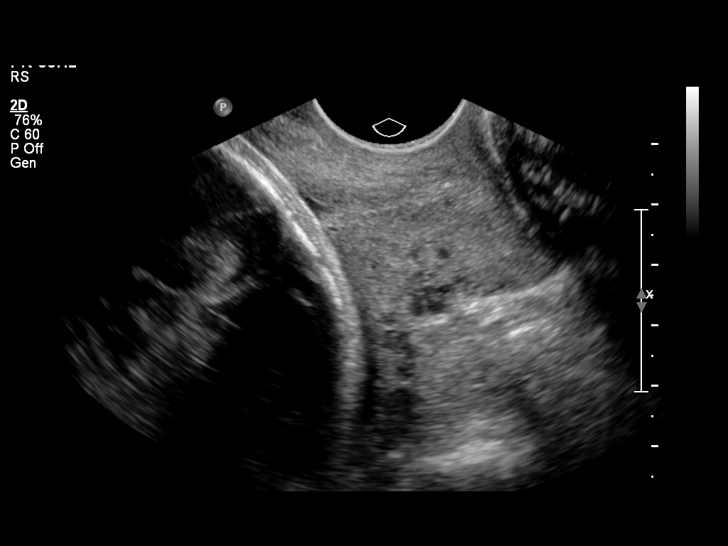
[im 14/15]
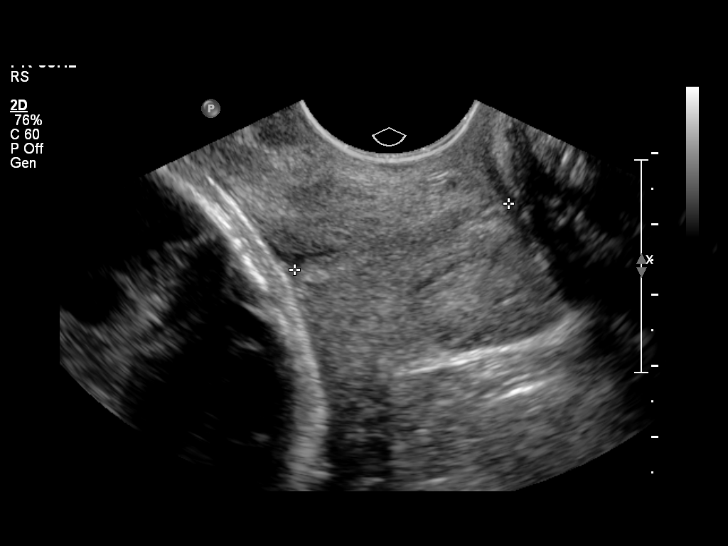
[im 15/15]
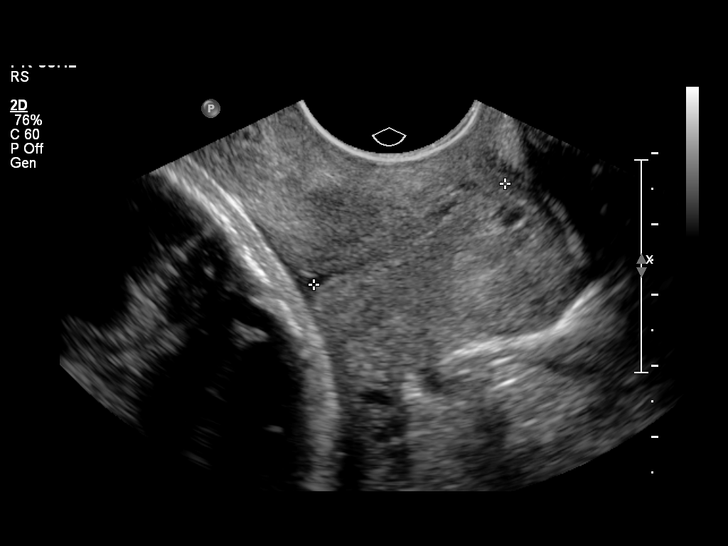

[13 of 15 positions shown; findings below may reference images not displayed]

OBSTETRICS REPORT
                      (Signed Final 05/06/2012 [DATE])

 Order#:         [id number]
Procedures

 US OB TRANSVAGINAL                                    76817.0
Indications

 Assess cervical length
 Preterm labor (> 22 weeks)
 Cleft lip, bilateral
Fetal Evaluation

 Fetal Heart Rate:  140                          bpm
 Cardiac Activity:  Observed
 Presentation:      Cephalic
 Placenta:          Posterior, above cervical
                    os

 Amniotic Fluid
 AFI FV:      Subjectively within normal limits
                                              Larg Pckt:    4.8  cm
Gestational Age

 LMP:           32w 4d        Date:  09/21/11                 EDD:   06/27/12
 Best:          31w 0d     Det. By:  [facility]         EDD:   07/08/12
                                     (11/30/11)
Anatomy

 Cranium:           Appears normal      Aortic Arch:       Previously seen
 Fetal Cavum:       Previously seen     Ductal Arch:       Previously seen
 Ventricles:        Appears normal      Diaphragm:         Appears normal
 Choroid Plexus:    Previously seen     Stomach:           Appears normal,
                                                           left sided
 Cerebellum:        Previously seen     Abdomen:           Previously seen
 Posterior Fossa:   Previously seen     Abdominal Wall:    Previously seen
 Nuchal Fold:       Previously seen     Cord Vessels:      Previously seen
 Face:              Bilateral cleft lip Kidneys:           Appear normal
 Heart:             Previously seen     Bladder:           Appears normal
 RVOT:              Previously seen     Spine:             Previously seen
 LVOT:              Previously seen     Limbs:             Previously seen
Cervix Uterus Adnexa

 Cervical Length:    3.11      cm

 Cervix:       Normal appearance by transvaginal scan
Impression

 Single living intrauterine pregnancy in cephalic presentation.
 The estimated gestational age is 31w 0d based on Early
 Ultrasound  (11/30/11). Cervical length is 3.1 cm.
 Bilateral cleft lip and palate have been seen on earlier exam.

 questions or concerns.

## 2014-07-22 ENCOUNTER — Encounter (HOSPITAL_BASED_OUTPATIENT_CLINIC_OR_DEPARTMENT_OTHER): Payer: Self-pay | Admitting: Emergency Medicine

## 2014-09-03 ENCOUNTER — Emergency Department (HOSPITAL_COMMUNITY)
Admission: EM | Admit: 2014-09-03 | Discharge: 2014-09-04 | Disposition: A | Discharge status: Home / Self Care | Payer: Medicaid Other | Attending: Emergency Medicine | Admitting: Emergency Medicine

## 2014-09-03 ENCOUNTER — Encounter (HOSPITAL_COMMUNITY): Payer: Self-pay | Admitting: *Deleted

## 2014-09-03 ENCOUNTER — Emergency Department (HOSPITAL_COMMUNITY): Payer: Medicaid Other

## 2014-09-03 DIAGNOSIS — F419 Anxiety disorder, unspecified: Secondary | ICD-10-CM | POA: Diagnosis not present

## 2014-09-03 DIAGNOSIS — Z79899 Other long term (current) drug therapy: Secondary | ICD-10-CM | POA: Diagnosis not present

## 2014-09-03 DIAGNOSIS — Y9289 Other specified places as the place of occurrence of the external cause: Secondary | ICD-10-CM | POA: Diagnosis not present

## 2014-09-03 DIAGNOSIS — Y9389 Activity, other specified: Secondary | ICD-10-CM | POA: Insufficient documentation

## 2014-09-03 DIAGNOSIS — Q351 Cleft hard palate: Secondary | ICD-10-CM | POA: Insufficient documentation

## 2014-09-03 DIAGNOSIS — Z8742 Personal history of other diseases of the female genital tract: Secondary | ICD-10-CM | POA: Insufficient documentation

## 2014-09-03 DIAGNOSIS — Z720 Tobacco use: Secondary | ICD-10-CM | POA: Insufficient documentation

## 2014-09-03 DIAGNOSIS — M2550 Pain in unspecified joint: Secondary | ICD-10-CM

## 2014-09-03 DIAGNOSIS — F329 Major depressive disorder, single episode, unspecified: Secondary | ICD-10-CM | POA: Diagnosis not present

## 2014-09-03 DIAGNOSIS — X58XXXA Exposure to other specified factors, initial encounter: Secondary | ICD-10-CM | POA: Insufficient documentation

## 2014-09-03 DIAGNOSIS — J45909 Unspecified asthma, uncomplicated: Secondary | ICD-10-CM | POA: Insufficient documentation

## 2014-09-03 DIAGNOSIS — Y998 Other external cause status: Secondary | ICD-10-CM | POA: Diagnosis not present

## 2014-09-03 DIAGNOSIS — Z8781 Personal history of (healed) traumatic fracture: Secondary | ICD-10-CM | POA: Insufficient documentation

## 2014-09-03 DIAGNOSIS — S8992XA Unspecified injury of left lower leg, initial encounter: Secondary | ICD-10-CM | POA: Insufficient documentation

## 2014-09-03 DIAGNOSIS — Z880 Allergy status to penicillin: Secondary | ICD-10-CM | POA: Insufficient documentation

## 2014-09-03 MED ORDER — FENTANYL CITRATE 0.05 MG/ML IJ SOLN
50.0000 ug | Freq: Once | INTRAMUSCULAR | Status: AC
  Administered 2014-09-03: 50 ug via INTRAVENOUS
  Filled 2014-09-03: qty 2

## 2014-09-03 MED ORDER — HYDROMORPHONE HCL 1 MG/ML IJ SOLN
1.0000 mg | Freq: Once | INTRAMUSCULAR | Status: AC
  Administered 2014-09-03: 1 mg via INTRAVENOUS
  Filled 2014-09-03: qty 1

## 2014-09-03 MED ORDER — ONDANSETRON HCL 4 MG/2ML IJ SOLN
4.0000 mg | Freq: Once | INTRAMUSCULAR | Status: AC
  Administered 2014-09-03: 4 mg via INTRAVENOUS
  Filled 2014-09-03: qty 2

## 2014-09-03 MED ORDER — OXYCODONE-ACETAMINOPHEN 5-325 MG PO TABS: 1.0000 | ORAL_TABLET | ORAL | Status: DC | PRN

## 2014-09-03 NOTE — ED Notes (Signed)
Dr.Pollina aware that fentanyl has not helped with pain

## 2014-09-03 NOTE — ED Provider Notes (Signed)
CSN: [id number]     Arrival date & time 09/03/14  2157 History   First MD Initiated Contact with Patient 09/03/14 2158     Chief Complaint  Patient presents with  . Knee Injury     (Consider location/radiation/quality/duration/timing/severity/associated sxs/prior Treatment) HPI Comments: Patient presents to the ER for evaluation of left knee pain. Patient reports that she was sitting on her couch and moved her leg, felt a sudden pop in the left knee. She reports severe, constant pain in the knee and that it is locked up. She cannot extend the knee. She reports a history of right knee dislocation and this feels similar. No direct trauma to the knee.   Past Medical History  Diagnosis Date  . Asthma   . Headache(784.0)   . Anxiety   . Ovarian cyst   . Endometriosis   . Cleft lip   . Cleft hard palate   . PONV (postoperative nausea and vomiting)   . Depression   . Abnormal Pap smear     colpo 5/09   Past Surgical History  Procedure Laterality Date  . Cosmetic surgery    . Tympanostomy tube placement    . Abdominal surgery    . Laparoscopy    . Salpingectomy    . No past surgeries    . Abdominal hysterectomy     Family History  Problem Relation Age of Onset  . Hypertension Maternal Grandfather   . Anesthesia problems Neg Hx    History  Substance Use Topics  . Smoking status: Current Every Day Smoker -- 0.50 packs/day for 8 years    Types: Cigarettes  . Smokeless tobacco: Never Used  . Alcohol Use: No   OB History    Gravida Para Term Preterm AB TAB SAB Ectopic Multiple Living   2 2 2       2      Review of Systems  Musculoskeletal: Positive for arthralgias.  All other systems reviewed and are negative.     Allergies  Vancomycin and Amoxicillin-pot clavulanate  Home Medications   Prior to Admission medications   Medication Sig Start Date End Date Taking? Authorizing Provider  albuterol (PROVENTIL HFA;VENTOLIN HFA) 108 (90 BASE) MCG/ACT inhaler Inhale 2  puffs into the lungs every 6 (six) hours as needed for wheezing or shortness of breath.   Yes Historical Provider, MD  ALPRAZolam Prudy Feeler(XANAX) 1 MG tablet Take 0.5 mg by mouth 3 (three) times daily as needed for anxiety.   Yes Historical Provider, MD  cyclobenzaprine (FLEXERIL) 10 MG tablet Take 1 tablet (10 mg total) by mouth 2 (two) times daily as needed for muscle spasms. 06/28/14  Yes Elwin MochaBlair Walden, MD  oxyCODONE-acetaminophen (PERCOCET) 5-325 MG per tablet Take 1 tablet by mouth every 4 (four) hours as needed. 09/03/14   Gilda Creasehristopher J. Quandre Polinski, MD  traMADol (ULTRAM) 50 MG tablet Take 1 tablet (50 mg total) by mouth every 6 (six) hours as needed. Patient not taking: Reported on 09/03/2014 06/24/14   Suzi RootsKevin E Steinl, MD   BP 101/54 mmHg  Pulse 92  Temp(Src) 98.4 F (36.9 C) (Oral)  Resp 20  SpO2 96%  LMP 06/03/2014 Physical Exam  Constitutional: She is oriented to person, place, and time. She appears well-developed and well-nourished. No distress.  HENT:  Head: Normocephalic and atraumatic.  Right Ear: Hearing normal.  Left Ear: Hearing normal.  Nose: Nose normal.  Mouth/Throat: Oropharynx is clear and moist and mucous membranes are normal.  Eyes: Conjunctivae and EOM are normal.  Pupils are equal, round, and reactive to light.  Neck: Normal range of motion. Neck supple.  Cardiovascular: Regular rhythm, S1 normal and S2 normal.  Exam reveals no gallop and no friction rub.   No murmur heard. Pulmonary/Chest: Effort normal and breath sounds normal. No respiratory distress. She exhibits no tenderness.  Abdominal: Soft. Normal appearance and bowel sounds are normal. There is no hepatosplenomegaly. There is no tenderness. There is no rebound, no guarding, no tenderness at McBurney's point and negative Murphy's sign. No hernia.  Musculoskeletal:       Left knee: She exhibits decreased range of motion. She exhibits no swelling, no effusion, no ecchymosis, no deformity, no laceration, no erythema,  normal alignment, no LCL laxity, normal patellar mobility and no MCL laxity. Tenderness found.  Neurological: She is alert and oriented to person, place, and time. She has normal strength. No cranial nerve deficit or sensory deficit. Coordination normal. GCS eye subscore is 4. GCS verbal subscore is 5. GCS motor subscore is 6.  Skin: Skin is warm, dry and intact. No rash noted. No cyanosis.  Psychiatric: She has a normal mood and affect. Her speech is normal and behavior is normal. Thought content normal.  Nursing note and vitals reviewed.   ED Course  Procedures (including critical care time) Labs Review Labs Reviewed - No data to display  Imaging Review Dg Knee Complete 4 Views Left  09/04/2014   CLINICAL DATA:  Knee injury.  Initial encounter  EXAM: LEFT KNEE - COMPLETE 4+ VIEW  COMPARISON:  11/18/2013  FINDINGS: Limited frontal imaging due to persistent flexion. There is no evidence of acute fracture or malalignment. No joint effusion. No degenerative changes.  IMPRESSION: Negative flexed radiographs of the left knee.   Electronically Signed   By: Tiburcio PeaJonathan  Watts M.D.   On: 09/04/2014 00:14     EKG Interpretation None      MDM   Final diagnoses:  Arthralgia    Patient presented to the ER for evaluation of left knee pain. Patient had sudden onset of severe pain in the left knee and inability to move it after she bent her knee while trying to move on the couch. X-ray was negative. No concern for dislocation of patella or knee. Based on her symptoms, would consider possible meniscus injury. Patient provided analgesia, placed in the immobilizer, follow-up with orthopedics. She has been seen at [facility] [facility] previously.    Gilda Creasehristopher J. Tyhir Schwan, MD 09/05/14 [phone]

## 2014-09-03 NOTE — ED Notes (Signed)
Pt with L knee splinted by EMS; no deformity noted. Pt reports unable to straighten leg. Per EMS patella appeared out of place on their arrival; patella in place at present. Pt with hx of R knee dislocation in which she has been able to reduce.

## 2014-09-03 NOTE — ED Notes (Signed)
Pt to ED via [facility] c/o L knee dislocation. Reports hx of same with R leg. Pt reports sitting on the couch and lifting her L knee up when it became dislocated. Pt unable to replace knee at home. Fentanyl given enroute. VS bp 132/84, hr 100, spo2 98%

## 2014-09-03 NOTE — Discharge Instructions (Signed)

## 2014-09-04 MED ORDER — ONDANSETRON HCL 4 MG/2ML IJ SOLN
4.0000 mg | Freq: Once | INTRAMUSCULAR | Status: AC
  Administered 2014-09-04: 4 mg via INTRAVENOUS
  Filled 2014-09-04: qty 2

## 2014-09-04 NOTE — ED Notes (Signed)
Pt dry heaving; Dr.Pollina aware

## 2014-09-04 NOTE — ED Notes (Signed)
Pt vomiting; zofran given as ordered by Dr.Pollina

## 2014-09-04 NOTE — ED Notes (Signed)
Dr. Pollina at bedside   

## 2014-12-26 ENCOUNTER — Telehealth: Payer: Self-pay | Admitting: *Deleted

## 2014-12-26 NOTE — Telephone Encounter (Signed)
Pt presented HANDWRITTEN prescription for percocet fron 06/28/2014...Marland Kitchen.NCM advised not to fill as we do not normally give handwritten prescriptions.  Pt received computer prescripition for flexeril on that date- not percocet.

## 2015-01-12 ENCOUNTER — Emergency Department (HOSPITAL_COMMUNITY): Payer: Medicaid Other

## 2015-01-12 ENCOUNTER — Emergency Department (HOSPITAL_COMMUNITY)
Admission: EM | Admit: 2015-01-12 | Discharge: 2015-01-12 | Disposition: A | Discharge status: Home / Self Care | Payer: Medicaid Other | Attending: Emergency Medicine | Admitting: Emergency Medicine

## 2015-01-12 ENCOUNTER — Encounter (HOSPITAL_COMMUNITY): Payer: Self-pay | Admitting: *Deleted

## 2015-01-12 DIAGNOSIS — Z79899 Other long term (current) drug therapy: Secondary | ICD-10-CM | POA: Insufficient documentation

## 2015-01-12 DIAGNOSIS — M5442 Lumbago with sciatica, left side: Secondary | ICD-10-CM | POA: Diagnosis not present

## 2015-01-12 DIAGNOSIS — M545 Low back pain, unspecified: Secondary | ICD-10-CM

## 2015-01-12 DIAGNOSIS — F329 Major depressive disorder, single episode, unspecified: Secondary | ICD-10-CM | POA: Diagnosis not present

## 2015-01-12 DIAGNOSIS — Z8742 Personal history of other diseases of the female genital tract: Secondary | ICD-10-CM | POA: Insufficient documentation

## 2015-01-12 DIAGNOSIS — Z880 Allergy status to penicillin: Secondary | ICD-10-CM | POA: Diagnosis not present

## 2015-01-12 DIAGNOSIS — J45909 Unspecified asthma, uncomplicated: Secondary | ICD-10-CM | POA: Insufficient documentation

## 2015-01-12 DIAGNOSIS — Z87730 Personal history of (corrected) cleft lip and palate: Secondary | ICD-10-CM | POA: Diagnosis not present

## 2015-01-12 DIAGNOSIS — F419 Anxiety disorder, unspecified: Secondary | ICD-10-CM | POA: Insufficient documentation

## 2015-01-12 DIAGNOSIS — Z720 Tobacco use: Secondary | ICD-10-CM | POA: Diagnosis not present

## 2015-01-12 MED ORDER — IBUPROFEN 800 MG PO TABS: 800.0000 mg | ORAL_TABLET | Freq: Three times a day (TID) | ORAL | Status: DC

## 2015-01-12 MED ORDER — METHOCARBAMOL 500 MG PO TABS: 1000.0000 mg | ORAL_TABLET | Freq: Four times a day (QID) | ORAL | Status: DC

## 2015-01-12 NOTE — ED Notes (Signed)
Pt reports lower back pain x 2 days, denies injury to back or urinary symptoms. Ambulatory on arrival.

## 2015-01-12 NOTE — ED Notes (Signed)
Declined W/C at D/C and was escorted to lobby by RN. 

## 2015-01-12 NOTE — Discharge Instructions (Signed)
Sciatica °Sciatica is pain, weakness, numbness, or tingling along the path of the sciatic nerve. The nerve starts in the lower back and runs down the back of each leg. The nerve controls the muscles in the lower leg and in the back of the knee, while also providing sensation to the back of the thigh, lower leg, and the sole of your foot. Sciatica is a symptom of another medical condition. For instance, nerve damage or certain conditions, such as a herniated disk or bone spur on the spine, pinch or put pressure on the sciatic nerve. This causes the pain, weakness, or other sensations normally associated with sciatica. Generally, sciatica only affects one side of the body. °CAUSES  °· Herniated or slipped disc. °· Degenerative disk disease. °· A pain disorder involving the narrow muscle in the buttocks (piriformis syndrome). °· Pelvic injury or fracture. °· Pregnancy. °· Tumor (rare). °SYMPTOMS  °Symptoms can vary from mild to very severe. The symptoms usually travel from the low back to the buttocks and down the back of the leg. Symptoms can include: °· Mild tingling or dull aches in the lower back, leg, or hip. °· Numbness in the back of the calf or sole of the foot. °· Burning sensations in the lower back, leg, or hip. °· Sharp pains in the lower back, leg, or hip. °· Leg weakness. °· Severe back pain inhibiting movement. °These symptoms may get worse with coughing, sneezing, laughing, or prolonged sitting or standing. Also, being overweight may worsen symptoms. °DIAGNOSIS  °Your caregiver will perform a physical exam to look for common symptoms of sciatica. He or she may ask you to do certain movements or activities that would trigger sciatic nerve pain. Other tests may be performed to find the cause of the sciatica. These may include: °· Blood tests. °· X-rays. °· Imaging tests, such as an MRI or CT scan. °TREATMENT  °Treatment is directed at the cause of the sciatic pain. Sometimes, treatment is not necessary  and the pain and discomfort goes away on its own. If treatment is needed, your caregiver may suggest: °· Over-the-counter medicines to relieve pain. °· Prescription medicines, such as anti-inflammatory medicine, muscle relaxants, or narcotics. °· Applying heat or ice to the painful area. °· Steroid injections to lessen pain, irritation, and inflammation around the nerve. °· Reducing activity during periods of pain. °· Exercising and stretching to strengthen your abdomen and improve flexibility of your spine. Your caregiver may suggest losing weight if the extra weight makes the back pain worse. °· Physical therapy. °· Surgery to eliminate what is pressing or pinching the nerve, such as a bone spur or part of a herniated disk. °HOME CARE INSTRUCTIONS  °· Only take over-the-counter or prescription medicines for pain or discomfort as directed by your caregiver. °· Apply ice to the affected area for 20 minutes, 3-4 times a day for the first 48-72 hours. Then try heat in the same way. °· Exercise, stretch, or perform your usual activities if these do not aggravate your pain. °· Attend physical therapy sessions as directed by your caregiver. °· Keep all follow-up appointments as directed by your caregiver. °· Do not wear high heels or shoes that do not provide proper support. °· Check your mattress to see if it is too soft. A firm mattress may lessen your pain and discomfort. °SEEK IMMEDIATE MEDICAL CARE IF:  °· You lose control of your bowel or bladder (incontinence). °· You have increasing weakness in the lower back, pelvis, buttocks,   or legs. °· You have redness or swelling of your back. °· You have a burning sensation when you urinate. °· You have pain that gets worse when you lie down or awakens you at night. °· Your pain is worse than you have experienced in the past. °· Your pain is lasting longer than 4 weeks. °· You are suddenly losing weight without reason. °MAKE SURE YOU: °· Understand these  instructions. °· Will watch your condition. °· Will get help right away if you are not doing well or get worse. °Document Released: 08/31/2001 Document Revised: 03/07/2012 Document Reviewed: 01/16/2012 °[employer]® Patient Information ©2015 [employer]. This information is not intended to replace advice given to you by your health care provider. Make sure you discuss any questions you have with your health care provider. ° °Back Exercises °Back exercises help treat and prevent back injuries. The goal of back exercises is to increase the strength of your abdominal and back muscles and the flexibility of your back. These exercises should be started when you no longer have back pain. Back exercises include: °· Pelvic Tilt. Lie on your back with your knees bent. Tilt your pelvis until the lower part of your back is against the floor. Hold this position 5 to 10 sec and repeat 5 to 10 times. °· Knee to Chest. Pull first 1 knee up against your chest and hold for 20 to 30 seconds, repeat this with the other knee, and then both knees. This may be done with the other leg straight or bent, whichever feels better. °· Sit-Ups or Curl-Ups. Bend your knees 90 degrees. Start with tilting your pelvis, and do a partial, slow sit-up, lifting your trunk only 30 to 45 degrees off the floor. Take at least 2 to 3 seconds for each sit-up. Do not do sit-ups with your knees out straight. If partial sit-ups are difficult, simply do the above but with only tightening your abdominal muscles and holding it as directed. °· Hip-Lift. Lie on your back with your knees flexed 90 degrees. Push down with your feet and shoulders as you raise your hips a couple inches off the floor; hold for 10 seconds, repeat 5 to 10 times. °· Back arches. Lie on your stomach, propping yourself up on bent elbows. Slowly press on your hands, causing an arch in your low back. Repeat 3 to 5 times. Any initial stiffness and discomfort should lessen with repetition over  time. °· Shoulder-Lifts. Lie face down with arms beside your body. Keep hips and torso pressed to floor as you slowly lift your head and shoulders off the floor. °Do not overdo your exercises, especially in the beginning. Exercises may cause you some mild back discomfort which lasts for a few minutes; however, if the pain is more severe, or lasts for more than 15 minutes, do not continue exercises until you see your caregiver. Improvement with exercise therapy for back problems is slow.  °See your caregivers for assistance with developing a proper back exercise program. °Document Released: 10/14/2004 Document Revised: 11/29/2011 Document Reviewed: 07/08/2011 °[employer]® Patient Information ©2015 [employer]. This information is not intended to replace advice given to you by your health care provider. Make sure you discuss any questions you have with your health care provider. ° °

## 2015-01-12 NOTE — ED Provider Notes (Signed)
CSN: [id number]     Arrival date & time 01/12/15  [id number] History   First MD Initiated Contact with Patient 01/12/15 [phone]     Chief Complaint  Patient presents with  . Back Pain     (Consider location/radiation/quality/duration/timing/severity/associated sxs/prior Treatment) HPI Pamela CallasLauren Bailey is a 34 year old female who presents the ER complaining of lower back pain for the past 2 days. Patient reports gradual onset of pain in her L3 through L4 region, which is made worse with movement, range of motion and walking. Patient reports associated radiation of her pain and tingling sensation down the back of her left leg and into her foot. Patient denies specific injury at this site, denies heavy lifting, states she has not had this pain before. Patient denies nausea, vomiting, fever, chills, numbness, weakness, saddle anesthesia, bowel/bladder incontinence/retention, history of IV drug use or cancer.  Past Medical History  Diagnosis Date  . Asthma   . Headache(784.0)   . Anxiety   . Ovarian cyst   . Endometriosis   . Cleft lip   . Cleft hard palate   . PONV (postoperative nausea and vomiting)   . Depression   . Abnormal Pap smear     colpo 5/09   Past Surgical History  Procedure Laterality Date  . Cosmetic surgery    . Tympanostomy tube placement    . Abdominal surgery    . Laparoscopy    . Salpingectomy    . No past surgeries    . Abdominal hysterectomy     Family History  Problem Relation Age of Onset  . Hypertension Maternal Grandfather   . Anesthesia problems Neg Hx    History  Substance Use Topics  . Smoking status: Current Every Day Smoker -- 0.50 packs/day for 8 years    Types: Cigarettes  . Smokeless tobacco: Never Used  . Alcohol Use: No   OB History    Gravida Para Term Preterm AB TAB SAB Ectopic Multiple Living   2 2 2       2      Review of Systems  Constitutional: Negative for fever.  Eyes: Negative for visual disturbance.  Respiratory: Negative for  shortness of breath.   Cardiovascular: Negative for chest pain.  Gastrointestinal: Negative for nausea, vomiting and abdominal pain.  Genitourinary: Negative for dysuria.  Musculoskeletal: Positive for back pain.  Skin: Negative for rash.  Neurological: Negative for dizziness, syncope, weakness and numbness.  Psychiatric/Behavioral: Negative.       Allergies  Vancomycin and Amoxicillin-pot clavulanate  Home Medications   Prior to Admission medications   Medication Sig Start Date End Date Taking? Authorizing Provider  albuterol (PROVENTIL HFA;VENTOLIN HFA) 108 (90 BASE) MCG/ACT inhaler Inhale 2 puffs into the lungs every 6 (six) hours as needed for wheezing or shortness of breath.    Historical Provider, MD  ALPRAZolam Prudy Feeler(XANAX) 1 MG tablet Take 0.5 mg by mouth 3 (three) times daily as needed for anxiety.    Historical Provider, MD  cyclobenzaprine (FLEXERIL) 10 MG tablet Take 1 tablet (10 mg total) by mouth 2 (two) times daily as needed for muscle spasms. 06/28/14   Elwin MochaBlair Walden, MD  ibuprofen (ADVIL,MOTRIN) 800 MG tablet Take 1 tablet (800 mg total) by mouth 3 (three) times daily. 01/12/15   Ladona MowJoe Sameera Betton, PA-C  methocarbamol (ROBAXIN) 500 MG tablet Take 2 tablets (1,000 mg total) by mouth 4 (four) times daily. 01/12/15   Ladona MowJoe Gerrad Welker, PA-C  oxyCODONE-acetaminophen (PERCOCET) 5-325 MG per tablet Take 1 tablet by mouth  every 4 (four) hours as needed. 09/03/14   Gilda Crease, MD  traMADol (ULTRAM) 50 MG tablet Take 1 tablet (50 mg total) by mouth every 6 (six) hours as needed. Patient not taking: Reported on 09/03/2014 06/24/14   Cathren Laine, MD   BP 122/78 mmHg  Pulse 85  Temp(Src) 97.7 F (36.5 C) (Oral)  Resp 16  SpO2 100%  LMP 06/03/2014 Physical Exam  Constitutional: She is oriented to person, place, and time. She appears well-developed and well-nourished. No distress.  HENT:  Head: Normocephalic and atraumatic.  Eyes: Right eye exhibits no discharge. Left eye exhibits no  discharge. No scleral icterus.  Neck: Normal range of motion.  Pulmonary/Chest: Effort normal. No respiratory distress.  Musculoskeletal: Normal range of motion.       Cervical back: Normal.       Thoracic back: Normal.       Lumbar back: She exhibits tenderness, bony tenderness and pain. She exhibits normal range of motion, no swelling, no edema, no deformity and no laceration.       Back:  Neurological: She is alert and oriented to person, place, and time. She has normal strength. No cranial nerve deficit or sensory deficit. She displays a negative Romberg sign. Coordination and gait normal. GCS eye subscore is 4. GCS verbal subscore is 5. GCS motor subscore is 6.  Reflex Scores:      Patellar reflexes are 2+ on the right side and 2+ on the left side.      Achilles reflexes are 2+ on the right side and 2+ on the left side. Patient fully alert, answering questions appropriately in full, clear sentences. Cranial nerves II through XII grossly intact. Motor strength 5 out of 5 in all major muscle groups of upper and lower extremities. Distal sensation intact.   Skin: Skin is warm and dry. She is not diaphoretic.  Psychiatric: She has a normal mood and affect.  Nursing note and vitals reviewed.   ED Course  Procedures (including critical care time) Labs Review Labs Reviewed - No data to display  Imaging Review Dg Lumbar Spine Complete  01/12/2015   CLINICAL DATA:  Lower back pain for 2 days. Worse on the right side.  EXAM: LUMBAR SPINE - COMPLETE 4+ VIEW  COMPARISON:  CT 08/25/2013  FINDINGS: Normal alignment of the lumbar spine. The vertebral body heights are maintained. Disc spaces are maintained. No evidence for a pars defect.  IMPRESSION: No acute abnormality.   Electronically Signed   By: Richarda Overlie M.D.   On: 01/12/2015 10:48     EKG Interpretation None      MDM   Final diagnoses:  Low back pain  Midline low back pain with left-sided sciatica    Patient with back pain.   No neurological deficits and normal neuro exam.  Patient can walk but states is painful.  No loss of bowel or bladder control.  No concern for cauda equina.  No fever, night sweats, weight loss, h/o cancer, IVDU.  RICE protocol and pain medicine indicated and discussed with patient. Encouraged follow-up with primary care provider. Discussed return precautions with patient, they verbalize understanding and agreement with this plan. I encouraged patient to call or return to the ER if any worsening symptoms or should she have any questions or concerns.  BP 122/78 mmHg  Pulse 85  Temp(Src) 97.7 F (36.5 C) (Oral)  Resp 16  SpO2 100%  LMP 06/03/2014  Signed,  Ladona Mow, PA-C 11:07 AM  Ladona Mow, PA-C 01/12/15 1107  Arby Barrette, MD 01/18/15 [phone]

## 2015-06-04 DIAGNOSIS — F419 Anxiety disorder, unspecified: Secondary | ICD-10-CM | POA: Diagnosis present

## 2015-07-16 DIAGNOSIS — R5382 Chronic fatigue, unspecified: Secondary | ICD-10-CM | POA: Diagnosis present

## 2015-08-18 ENCOUNTER — Inpatient Hospital Stay (HOSPITAL_COMMUNITY)
Admission: AD | Admit: 2015-08-18 | Discharge: 2015-08-18 | Disposition: A | Discharge status: Home / Self Care | Payer: Medicaid Other | Source: Ambulatory Visit | Attending: Obstetrics & Gynecology | Admitting: Obstetrics & Gynecology

## 2015-08-18 ENCOUNTER — Encounter (HOSPITAL_COMMUNITY): Payer: Self-pay | Admitting: *Deleted

## 2015-08-18 ENCOUNTER — Inpatient Hospital Stay (HOSPITAL_COMMUNITY): Payer: Medicaid Other

## 2015-08-18 DIAGNOSIS — R1011 Right upper quadrant pain: Secondary | ICD-10-CM

## 2015-08-18 DIAGNOSIS — R110 Nausea: Secondary | ICD-10-CM | POA: Insufficient documentation

## 2015-08-18 LAB — COMPREHENSIVE METABOLIC PANEL
ALBUMIN: 4.7 g/dL (ref 3.5–5.0)
ALK PHOS: 93 U/L (ref 38–126)
ALT: 16 U/L (ref 14–54)
AST: 18 U/L (ref 15–41)
Anion gap: 6 (ref 5–15)
BUN: 21 mg/dL — ABNORMAL HIGH (ref 6–20)
CO2: 28 mmol/L (ref 22–32)
CREATININE: 0.66 mg/dL (ref 0.44–1.00)
Calcium: 9.5 mg/dL (ref 8.9–10.3)
Chloride: 104 mmol/L (ref 101–111)
GFR calc Af Amer: >60 mL/min (ref >60)
GFR calc non Af Amer: >60 mL/min (ref >60)
Glucose, Bld: 90 mg/dL (ref 65–99)
Potassium: 3.9 mmol/L (ref 3.5–5.1)
SODIUM: 138 mmol/L (ref 135–145)
Total Bilirubin: 0.2 mg/dL — ABNORMAL LOW (ref 0.3–1.2)
Total Protein: 7.8 g/dL (ref 6.5–8.1)

## 2015-08-18 LAB — URINALYSIS, ROUTINE W REFLEX MICROSCOPIC
Bilirubin Urine: NEGATIVE
Glucose, UA: NEGATIVE mg/dL
Ketones, ur: NEGATIVE mg/dL
LEUKOCYTES UA: NEGATIVE
Nitrite: NEGATIVE
PH: 6 (ref 5.0–8.0)
PROTEIN: NEGATIVE mg/dL
Specific Gravity, Urine: 1.015 (ref 1.005–1.030)

## 2015-08-18 LAB — URINE MICROSCOPIC-ADD ON: BACTERIA UA: NONE SEEN

## 2015-08-18 LAB — LIPASE, BLOOD: Lipase: 34 U/L (ref 11–51)

## 2015-08-18 LAB — CBC
HCT: 42.9 % (ref 36.0–46.0)
HEMOGLOBIN: 14.5 g/dL (ref 12.0–15.0)
MCH: 32.2 pg (ref 26.0–34.0)
MCHC: 33.8 g/dL (ref 30.0–36.0)
MCV: 95.1 fL (ref 78.0–100.0)
Platelets: 230 10*3/uL (ref 150–400)
RBC: 4.51 MIL/uL (ref 3.87–5.11)
RDW: 12.8 % (ref 11.5–15.5)
WBC: 17.3 10*3/uL — ABNORMAL HIGH (ref 4.0–10.5)

## 2015-08-18 LAB — AMYLASE: Amylase: 82 U/L (ref 28–100)

## 2015-08-18 MED ORDER — ONDANSETRON HCL 4 MG PO TABS: 4.0000 mg | ORAL_TABLET | Freq: Four times a day (QID) | ORAL | Status: DC

## 2015-08-18 NOTE — MAU Note (Signed)
Pt C/O mid & R mid-upper abd pain for the past 2 days.  Had N&V yesterday, just nausea today.

## 2015-08-18 NOTE — MAU Provider Note (Signed)
History     CSN: [id number]  Arrival date and time: 08/18/15 1644   First Provider Initiated Contact with Patient 08/18/15 1749      Chief Complaint  Patient presents with  . Abdominal Pain  . Nausea   HPI  [name] is a 34 y.o. [id number] who presents to MAU today with complaint of RUQ abdominal pain since last night, but worse since this morning. The also states associated nausea. She had one episode of vomiting yesterday, but denies vomiting today. She also denies fever. She rates pain at 7/10 now. She has not taken anything for pain.   OB History    Gravida Para Term Preterm AB TAB SAB Ectopic Multiple Living   Past Medical History  Diagnosis Date  . Asthma   . Headache(784.0)   . Anxiety   . Ovarian cyst   . Endometriosis   . Cleft lip   . Cleft hard palate   . PONV (postoperative nausea and vomiting)   . Depression   . Abnormal Pap smear     colpo 5/09    Past Surgical History  Procedure Laterality Date  . Cosmetic surgery    . Tympanostomy tube placement    . Abdominal surgery    . Laparoscopy    . Salpingectomy    . No past surgeries    . Abdominal hysterectomy      Family History  Problem Relation Age of Onset  . Hypertension Maternal Grandfather   . Anesthesia problems Neg Hx     Social History  Substance Use Topics  . Smoking status: Current Every Day Smoker -- 0.50 packs/day for 8 years    Types: Cigarettes  . Smokeless tobacco: Never Used  . Alcohol Use: No    Allergies:  Allergies  Allergen Reactions  . Vancomycin Other (See Comments)    Acute kidney injury   . Azithromycin Other (See Comments)    Caused renal failure  . Amoxicillin-Pot Clavulanate Nausea And Vomiting and Rash    Has patient had a PCN reaction causing immediate rash, facial/tongue/throat swelling, SOB or lightheadedness with hypotension: No Has patient had a PCN reaction causing severe rash involving mucus membranes or skin necrosis:  No Has patient had a PCN reaction that required hospitalization No Has patient had a PCN reaction occurring within the last 10 years: No If all of the above answers are "NO", then may proceed with Cephalosporin use.     No prescriptions prior to admission    Review of Systems  Constitutional: Negative for fever and malaise/fatigue.  Gastrointestinal: Positive for nausea and abdominal pain. Negative for vomiting.   Physical Exam   Blood pressure 119/70, pulse 82, temperature 98 F (36.7 C), temperature source Oral, resp. rate 16, last menstrual period 06/03/2014.  Physical Exam  Nursing note and vitals reviewed. Constitutional: She is oriented to person, place, and time. She appears well-developed and well-nourished. No distress.  HENT:  Head: Normocephalic and atraumatic.  Cardiovascular: Normal rate.   Respiratory: Effort normal.  GI: Soft. She exhibits no distension and no mass. There is tenderness (moderate RUQ abdominal tenderness to palpation). There is guarding. There is no rebound.  Neurological: She is alert and oriented to person, place, and time.  Skin: Skin is warm and dry. No erythema.  Psychiatric: She has a normal mood and affect.   Results for orders placed or performed during the  hospital encounter of 08/18/15 (from the past 24 hour(s))  Urinalysis, Routine w reflex microscopic (not at [facility])     Status: Abnormal   Collection Time: 08/18/15  5:05 PM  Result Value Ref Range   Color, Urine YELLOW YELLOW   APPearance CLEAR CLEAR   Specific Gravity, Urine 1.015 1.005 - 1.030   pH 6.0 5.0 - 8.0   Glucose, UA NEGATIVE NEGATIVE mg/dL   Hgb urine dipstick TRACE (A) NEGATIVE   Bilirubin Urine NEGATIVE NEGATIVE   Ketones, ur NEGATIVE NEGATIVE mg/dL   Protein, ur NEGATIVE NEGATIVE mg/dL   Nitrite NEGATIVE NEGATIVE   Leukocytes, UA NEGATIVE NEGATIVE  Urine microscopic-add on     Status: Abnormal   Collection Time: 08/18/15  5:05 PM  Result Value Ref Range    Squamous Epithelial / LPF 0-5 (A) NONE SEEN   WBC, UA 0-5 0 - 5 WBC/hpf   RBC / HPF 0-5 0 - 5 RBC/hpf   Bacteria, UA NONE SEEN NONE SEEN  CBC     Status: Abnormal   Collection Time: 08/18/15  5:46 PM  Result Value Ref Range   WBC 17.3 (H) 4.0 - 10.5 K/uL   RBC 4.51 3.87 - 5.11 MIL/uL   Hemoglobin 14.5 12.0 - 15.0 g/dL   HCT 09.842.9 11.936.0 - 14.746.0 %   MCV 95.1 78.0 - 100.0 fL   MCH 32.2 26.0 - 34.0 pg   MCHC 33.8 30.0 - 36.0 g/dL   RDW 82.912.8 56.211.5 - 13.015.5 %   Platelets 230 150 - 400 K/uL  Comprehensive metabolic panel     Status: Abnormal   Collection Time: 08/18/15  5:46 PM  Result Value Ref Range   Sodium 138 135 - 145 mmol/L   Potassium 3.9 3.5 - 5.1 mmol/L   Chloride 104 101 - 111 mmol/L   CO2 28 22 - 32 mmol/L   Glucose, Bld 90 65 - 99 mg/dL   BUN 21 (H) 6 - 20 mg/dL   Creatinine, Ser 8.650.66 0.44 - 1.00 mg/dL   Calcium 9.5 8.9 - 78.410.3 mg/dL   Total Protein 7.8 6.5 - 8.1 g/dL   Albumin 4.7 3.5 - 5.0 g/dL   AST 18 15 - 41 U/L   ALT 16 14 - 54 U/L   Alkaline Phosphatase 93 38 - 126 U/L   Total Bilirubin 0.2 (L) 0.3 - 1.2 mg/dL   GFR calc non Af Amer >60 >60 mL/min   GFR calc Af Amer >60 >60 mL/min   Anion gap 6 5 - 15  Amylase     Status: None   Collection Time: 08/18/15  5:46 PM  Result Value Ref Range   Amylase 82 28 - 100 U/L  Lipase, blood     Status: None   Collection Time: 08/18/15  5:46 PM  Result Value Ref Range   Lipase 34 11 - 51 U/L    MAU Course  Procedures None  MDM Patient has had hysterectomy  UA, CBC, CMP, Amylase and Lipase today Discussed patient with Dr. Langston MaskerMorris. She [facility] for complete evaluation.  [facility]ston [facility]. He states that RUQ US would be the best modality for evaluation of the gallbladder. Since [facility] I will order here and consult with EDP further depending on results.  Patient refused pain medication while in MAU  Assessment and Plan  A: RUQ abdominal pain;  GI vs MSK most likely  P: Discharge home  Rx for Zofran given PRN for nausea  Ibuprofen PRN for pain Warning signs for worsening condition discussed Patient advised to follow-up with PCP for further evaluation and management if symptoms persist or worsen Patient may return to MAU as needed or if her condition were to change or worsen, however if these symptoms worsen or she develops fever advised to go to [facility] or [facility] for further evaluation.   Marny Lowenstein, PA-C  08/18/2015, 8:57 PM

## 2015-08-18 NOTE — Discharge Instructions (Signed)

## 2015-08-21 DIAGNOSIS — G510 Bell's palsy: Secondary | ICD-10-CM

## 2016-06-20 ENCOUNTER — Inpatient Hospital Stay (HOSPITAL_COMMUNITY)
Admission: EM | Admit: 2016-06-20 | Discharge: 2016-06-22 | DRG: 091 | Disposition: A | Discharge status: Home / Self Care | Payer: Medicaid Other | Attending: Internal Medicine | Admitting: Internal Medicine

## 2016-06-20 ENCOUNTER — Encounter (HOSPITAL_COMMUNITY): Payer: Self-pay | Admitting: Emergency Medicine

## 2016-06-20 ENCOUNTER — Emergency Department (HOSPITAL_COMMUNITY): Payer: Medicaid Other

## 2016-06-20 DIAGNOSIS — R519 Headache, unspecified: Secondary | ICD-10-CM | POA: Diagnosis present

## 2016-06-20 DIAGNOSIS — R29898 Other symptoms and signs involving the musculoskeletal system: Secondary | ICD-10-CM | POA: Diagnosis not present

## 2016-06-20 DIAGNOSIS — R200 Anesthesia of skin: Secondary | ICD-10-CM

## 2016-06-20 DIAGNOSIS — E876 Hypokalemia: Secondary | ICD-10-CM

## 2016-06-20 DIAGNOSIS — R5382 Chronic fatigue, unspecified: Secondary | ICD-10-CM | POA: Diagnosis present

## 2016-06-20 DIAGNOSIS — Z79899 Other long term (current) drug therapy: Secondary | ICD-10-CM

## 2016-06-20 DIAGNOSIS — D72829 Elevated white blood cell count, unspecified: Secondary | ICD-10-CM

## 2016-06-20 DIAGNOSIS — R51 Headache: Secondary | ICD-10-CM | POA: Diagnosis present

## 2016-06-20 DIAGNOSIS — F419 Anxiety disorder, unspecified: Secondary | ICD-10-CM | POA: Diagnosis present

## 2016-06-20 DIAGNOSIS — F17210 Nicotine dependence, cigarettes, uncomplicated: Secondary | ICD-10-CM | POA: Diagnosis present

## 2016-06-20 DIAGNOSIS — R531 Weakness: Secondary | ICD-10-CM

## 2016-06-20 DIAGNOSIS — J209 Acute bronchitis, unspecified: Secondary | ICD-10-CM | POA: Diagnosis present

## 2016-06-20 DIAGNOSIS — G510 Bell's palsy: Secondary | ICD-10-CM

## 2016-06-20 DIAGNOSIS — Z681 Body mass index (BMI) 19 or less, adult: Secondary | ICD-10-CM

## 2016-06-20 DIAGNOSIS — R202 Paresthesia of skin: Principal | ICD-10-CM | POA: Diagnosis present

## 2016-06-20 DIAGNOSIS — I959 Hypotension, unspecified: Secondary | ICD-10-CM | POA: Diagnosis present

## 2016-06-20 DIAGNOSIS — R29810 Facial weakness: Secondary | ICD-10-CM | POA: Diagnosis present

## 2016-06-20 DIAGNOSIS — E43 Unspecified severe protein-calorie malnutrition: Secondary | ICD-10-CM | POA: Diagnosis present

## 2016-06-20 HISTORY — DX: Bell's palsy: G51.0

## 2016-06-20 LAB — I-STAT CHEM 8, ED
BUN: 14 mg/dL (ref 6–20)
CHLORIDE: 103 mmol/L (ref 101–111)
CREATININE: 0.6 mg/dL (ref 0.44–1.00)
Calcium, Ion: 1.14 mmol/L — ABNORMAL LOW (ref 1.15–1.40)
Glucose, Bld: 143 mg/dL — ABNORMAL HIGH (ref 65–99)
HEMATOCRIT: 43 % (ref 36.0–46.0)
HEMOGLOBIN: 14.6 g/dL (ref 12.0–15.0)
POTASSIUM: 3.2 mmol/L — AB (ref 3.5–5.1)
Sodium: 141 mmol/L (ref 135–145)
TCO2: 24 mmol/L (ref 0–100)

## 2016-06-20 LAB — COMPREHENSIVE METABOLIC PANEL
ALBUMIN: 4 g/dL (ref 3.5–5.0)
ALT: 14 U/L (ref 14–54)
AST: 21 U/L (ref 15–41)
Alkaline Phosphatase: 82 U/L (ref 38–126)
Anion gap: 8 (ref 5–15)
BUN: 12 mg/dL (ref 6–20)
CHLORIDE: 107 mmol/L (ref 101–111)
CO2: 25 mmol/L (ref 22–32)
Calcium: 9.7 mg/dL (ref 8.9–10.3)
Creatinine, Ser: 0.7 mg/dL (ref 0.44–1.00)
GFR calc Af Amer: >60 mL/min (ref >60)
GFR calc non Af Amer: >60 mL/min (ref >60)
GLUCOSE: 146 mg/dL — AB (ref 65–99)
POTASSIUM: 3.3 mmol/L — AB (ref 3.5–5.1)
SODIUM: 140 mmol/L (ref 135–145)
Total Bilirubin: 0.3 mg/dL (ref 0.3–1.2)
Total Protein: 6.5 g/dL (ref 6.5–8.1)

## 2016-06-20 LAB — ETHANOL

## 2016-06-20 LAB — CBG MONITORING, ED: Glucose-Capillary: 156 mg/dL — ABNORMAL HIGH (ref 65–99)

## 2016-06-20 LAB — DIFFERENTIAL
BASOS ABS: 0 10*3/uL (ref 0.0–0.1)
BASOS PCT: 0 %
EOS ABS: 0.2 10*3/uL (ref 0.0–0.7)
Eosinophils Relative: 1 %
Lymphocytes Relative: 13 %
Lymphs Abs: 2.9 10*3/uL (ref 0.7–4.0)
Monocytes Absolute: 1.6 10*3/uL — ABNORMAL HIGH (ref 0.1–1.0)
Monocytes Relative: 8 %
NEUTROS PCT: 78 %
Neutro Abs: 16.8 10*3/uL — ABNORMAL HIGH (ref 1.7–7.7)

## 2016-06-20 LAB — CBC
HCT: 41.5 % (ref 36.0–46.0)
HEMOGLOBIN: 14 g/dL (ref 12.0–15.0)
MCH: 32.3 pg (ref 26.0–34.0)
MCHC: 33.7 g/dL (ref 30.0–36.0)
MCV: 95.6 fL (ref 78.0–100.0)
Platelets: 244 10*3/uL (ref 150–400)
RBC: 4.34 MIL/uL (ref 3.87–5.11)
RDW: 12 % (ref 11.5–15.5)
WBC: 21.6 10*3/uL — AB (ref 4.0–10.5)

## 2016-06-20 LAB — PROTIME-INR
INR: 1.02
Prothrombin Time: 13.5 seconds (ref 11.4–15.2)

## 2016-06-20 LAB — APTT: APTT: 35 s (ref 24–36)

## 2016-06-20 LAB — I-STAT TROPONIN, ED: TROPONIN I, POC: 0 ng/mL (ref 0.00–0.08)

## 2016-06-20 MED ORDER — LORAZEPAM 2 MG/ML IJ SOLN
1.0000 mg | Freq: Once | INTRAMUSCULAR | Status: AC
  Administered 2016-06-21: 1 mg via INTRAVENOUS
  Filled 2016-06-20: qty 1

## 2016-06-20 NOTE — ED Provider Notes (Signed)
[facility]-EMERGENCY DEPT Provider Note   CSN: [id number] Arrival date & time: 06/20/16  2308 By signing my name below, I, Levon HedgerElizabeth Hall, attest that this documentation has been prepared under the direction and in the presence of Tilden FossaElizabeth Malory Spurr, MD . Electronically Signed: Levon HedgerElizabeth Hall, Scribe. 06/20/2016. 11:29 PM.   History   Chief Complaint Chief Complaint  Patient presents with  . Numbness    HPI Pamela Bailey is a 35 y.o. female with hx of right sided facial paralysis brought in by ambulance who presents to the Emergency Department with worsening left arm and leg weakness onset tonight at 10 pm. Over the last month, pt has had waxing and waning left leg weakness and numbness. Her symptoms have progressively worsened tonight. No alleviating or modifying factors noted.  Pt also reports fever  (tmax 101.6).   The history is provided by the patient. No language interpreter was used.   Past Medical History:  Diagnosis Date  . Abnormal Pap smear    colpo 5/09  . Anxiety   . Asthma   . Cleft hard palate   . Cleft lip   . Depression   . Endometriosis   . Headache(784.0)   . Ovarian cyst   . PONV (postoperative nausea and vomiting)     Patient Active Problem List   Diagnosis Date Noted  . HEADACHE 08/30/2008  . NAUSEA 08/30/2008  . ABDOMINAL PAIN-MULTIPLE SITES 08/30/2008  . RENAL FAILURE, ACUTE 08/29/2008    Past Surgical History:  Procedure Laterality Date  . ABDOMINAL HYSTERECTOMY    . ABDOMINAL SURGERY    . COSMETIC SURGERY    . LAPAROSCOPY    . NO PAST SURGERIES    . SALPINGECTOMY    . TYMPANOSTOMY TUBE PLACEMENT      OB History    Gravida Para Term Preterm AB Living   2 2 2     2    SAB TAB Ectopic Multiple Live Births           2       Home Medications    Prior to Admission medications   Medication Sig Start Date End Date Taking? Authorizing Provider  albuterol (PROVENTIL HFA;VENTOLIN HFA) 108 (90 BASE) MCG/ACT inhaler Inhale 2 puffs into the lungs  every 6 (six) hours as needed for wheezing or shortness of breath.    Historical Provider, MD  ALPRAZolam Prudy Feeler(XANAX) 1 MG tablet Take 1 mg by mouth 3 (three) times daily as needed for anxiety.     Historical Provider, MD  cyclobenzaprine (FLEXERIL) 10 MG tablet Take 1 tablet (10 mg total) by mouth 2 (two) times daily as needed for muscle spasms. Patient not taking: Reported on 08/18/2015 06/28/14   Elwin MochaBlair Walden, MD  ibuprofen (ADVIL,MOTRIN) 800 MG tablet Take 1 tablet (800 mg total) by mouth 3 (three) times daily. Patient not taking: Reported on 08/18/2015 01/12/15   Ladona MowJoe Mintz, PA-C  ondansetron (ZOFRAN) 4 MG tablet Take 1 tablet (4 mg total) by mouth every 6 (six) hours. 08/18/15   Marny LowensteinJulie N Wenzel, PA-C  oxyCODONE-acetaminophen (PERCOCET) 5-325 MG per tablet Take 1 tablet by mouth every 4 (four) hours as needed. Patient not taking: Reported on 08/18/2015 09/03/14   Gilda Creasehristopher J Pollina, MD  traMADol (ULTRAM) 50 MG tablet Take 1 tablet (50 mg total) by mouth every 6 (six) hours as needed. Patient not taking: Reported on 09/03/2014 06/24/14   Cathren LaineKevin Steinl, MD    Family History Family History  Problem Relation Age of Onset  .  Hypertension Maternal Grandfather   . Anesthesia problems Neg Hx     Social History Social History  Substance Use Topics  . Smoking status: Current Every Day Smoker    Packs/day: 0.50    Years: 8.00    Types: Cigarettes  . Smokeless tobacco: Never Used  . Alcohol use No     Allergies   Vancomycin; Azithromycin; and Amoxicillin-pot clavulanate   Review of Systems Review of Systems 10 systems reviewed and all are negative for acute change except as noted in the HPI.   Physical Exam Updated Vital Signs LMP 06/03/2014   Physical Exam  Constitutional: She is oriented to person, place, and time. She appears well-developed and well-nourished.  HENT:  Head: Normocephalic and atraumatic.  Cardiovascular: Normal rate and regular rhythm.   No murmur  heard. Pulmonary/Chest: Effort normal and breath sounds normal. No respiratory distress.  Abdominal: Soft. There is no rebound and no guarding.  Mild abdominal tenderness  Musculoskeletal: She exhibits no edema or tenderness.  Neurological: She is alert and oriented to person, place, and time.  Right upper and lower facial weakness.  Mild weakness of left upper and left lower extremity.    Skin: Skin is warm and dry.  Psychiatric: She has a normal mood and affect. Her behavior is normal.  Nursing note and vitals reviewed.  ED Treatments / Results  DIAGNOSTIC STUDIES:  Oxygen Saturation is 98% on RA, normal by my interpretation.    COORDINATION OF CARE:  11:30 PM Discussed treatment plan with pt at bedside and pt agreed to plan.  Labs (all labs ordered are listed, but only abnormal results are displayed) Labs Reviewed  ETHANOL  PROTIME-INR  APTT  CBC  DIFFERENTIAL  COMPREHENSIVE METABOLIC PANEL  URINE RAPID DRUG SCREEN, HOSP PERFORMED  URINALYSIS, ROUTINE W REFLEX MICROSCOPIC (NOT AT [facility])  I-STAT CHEM 8, ED  I-STAT TROPOININ, ED    EKG  EKG Interpretation None       Radiology No results found.  Procedures Procedures (including critical care time)  Medications Ordered in ED Medications - No data to display   Initial Impression / Assessment and Plan / ED Course  I have reviewed the triage vital signs and the nursing notes.  Pertinent labs & imaging results that were available during my care of the patient were reviewed by me and considered in my medical decision making (see chart for details).  Clinical Course    Pt here as a code stroke for new onset LLE/LUE weakness.  Pt with waxing and waning LLE weakness over the course of one month.  She does have mild weakness in LLE/LUE on exam.  Current clinical picture is not c/w CVA and she has been evaluated by Neurology.  In setting of back pain, leukocytosis plan to obtain MRI spine to further evaluate cause of  weakness.  Pt has anxiety and was given ativan for MRI but she was unable to tolerate the imaging.  On return to ED she was difficult to arouse or obtain consistent, reliable neurologic examination.  Plan to admit for observation, repeat exam, and potentially additional imaging as tolerated.  hospitalist consulted for admission.    Final Clinical Impressions(s) / ED Diagnoses   Final diagnoses:  None    New Prescriptions New Prescriptions   No medications on file  I personally performed the services described in this documentation, which was scribed in my presence. The recorded information has been reviewed and is accurate.    Tilden Fossa, MD 06/21/16  1931  

## 2016-06-20 NOTE — ED Triage Notes (Signed)
Pt brought to ED by [facility] from home for c/o on increase weakness and numbness on her left side, pt having intermittent weakness and numbness on her left leg pending for evaluation with Neurology, chronic right side facial droop on the right side. LSW at 22 pm when she got increase weakness going to her left arm, VS BP 124/80, HR 78, R-16, SPO2 98 on RA. Pt states she had a fever around 9:30 pm and got a Motrin around 2140.

## 2016-06-20 NOTE — Consult Note (Signed)
Neurology Consultation Reason for Consult: left leg weakness Referring Physician: Madilyn Hookees, E  CC: Left leg weakness  History is obtained from:patient  HPI: Pamela Bailey is a 35 y.o. female with a history of left leg weakness and numbness as well as bilateral arm weakness and numbness for the past month. She saw her PCP about it who ordered an MRI w/o contrast which supposedly was negative(do not have report or images). She was supposed to a see a neuroloigst. She describes occasional left leg paresthesias that shoot from her ankle to her knee in the same distribution that is now weak.   She reports fevers earlier today.   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  LKW: 10 pm  tpa given?: no, not a stroke    ROS: A 14 point ROS was performed and is negative except as noted in the HPI.   Past Medical History:  Diagnosis Date  . Abnormal Pap smear    colpo 5/09  . Anxiety   . Asthma   . Cleft hard palate   . Cleft lip   . Depression   . Endometriosis   . Headache(784.0)   . Ovarian cyst   . PONV (postoperative nausea and vomiting)      Family History  Problem Relation Age of Onset  . Hypertension Maternal Grandfather   . Anesthesia problems Neg Hx      Social History:  reports that she has been smoking Cigarettes.  She has a 4.00 pack-year smoking history. She has never used smokeless tobacco. She reports that she does not drink alcohol or use drugs.   Exam: Current vital signs: BP 142/80 (BP Location: Right Arm)   Pulse 90   Temp 98.7 F (37.1 C) (Oral)   Resp 18   Ht 5\' 2"  (1.575 m)   Wt 41.5 kg (91 lb 7.9 oz)   LMP 06/03/2014   SpO2 96%   BMI 16.73 kg/m  Vital signs in last 24 hours: Temp:  [98.7 F (37.1 C)] 98.7 F (37.1 C) (10/01 2333) Pulse Rate:  [90] 90 (10/01 2333) Resp:  [18] 18 (10/01 2333) BP: (142)/(80) 142/80 (10/01 2333) SpO2:  [96 %-98 %] 96 % (10/01 2333) Weight:  [41.5 kg  (91 lb 7.9 oz)] 41.5 kg (91 lb 7.9 oz) (10/01 2334)   Physical Exam  Constitutional: Appears well-developed and well-nourished.  Psych: Affect appropriate to situation Eyes: No scleral injection HENT: No OP obstrucion Head: Normocephalic.  Cardiovascular: Normal rate and regular rhythm.  Respiratory: Effort normal and breath sounds normal to anterior ascultation GI: Soft.  No distension. There is no tenderness.  Skin: WDI  Neuro: Mental Status: Patient is awake, alert, oriented to person, place, month, year, and situation. Patient is able to give a clear and coherent history. No signs of aphasia or neglect Cranial Nerves: II: Visual Fields are full. Pupils are equal, round, and reactive to light.   III,IV, VI: EOMI without ptosis or diploplia.  V: Facial sensation is symmetric to temperature VII: Facial movement is complete(upper and lower) paralysis of the right face(old deficit).  VIII: hearing is intact to voice X: Uvula elevates symmetrically XI: Shoulder shrug is symmetric. XII: tongue is midline without atrophy or fasciculations.  Motor: Tone is normal. Bulk is normal. 5/5 strength was present on the right side, she has 5/5 in the left arm, and 4+/5 strengh in the left leg with the exception of hip flexors(4+/5) and plantarflexion(3/5).  Sensory: Sensation is decreased in approximately the L5 distribution distally as well as the anterior aspect of her left htigh.  Deep Tendon Reflexes: 2-3+ and symmetric in the biceps and patellae, and ankles Plantars: Toes are downgoing bilaterally.  Cerebellar: FNF intact bilaterally.    I have reviewed labs in epic and the results pertinent to this consultation are: Leukocytosis.   I have reviewed the images obtained: CT head - negative  Impression: 35 yo F with left leg weakness, multifocal paresthesias. The description sounds radicular, and L5 can present with preserved reflexes, though her weakness seemed to be more an S1  distribution. Also possible would be disease higher in the neuraxis(e.g. t or C-spine) only partially affecting the cord. With fevers and leukocytosis, will need to rule out epidural abscess.   Recommendations: 1) MRI C,T,L spine with and without contrast.  2) further recs pending above imaging.    Ritta Slot, MD [facility] [phone]  If 7pm- 7am, please page neurology on call as listed in AMION.

## 2016-06-21 ENCOUNTER — Inpatient Hospital Stay (HOSPITAL_COMMUNITY): Payer: Medicaid Other

## 2016-06-21 ENCOUNTER — Encounter (HOSPITAL_COMMUNITY): Payer: Self-pay | Admitting: Family Medicine

## 2016-06-21 ENCOUNTER — Emergency Department (HOSPITAL_COMMUNITY): Payer: Medicaid Other

## 2016-06-21 DIAGNOSIS — R29810 Facial weakness: Secondary | ICD-10-CM | POA: Diagnosis present

## 2016-06-21 DIAGNOSIS — R200 Anesthesia of skin: Secondary | ICD-10-CM | POA: Diagnosis not present

## 2016-06-21 DIAGNOSIS — Z79899 Other long term (current) drug therapy: Secondary | ICD-10-CM | POA: Diagnosis not present

## 2016-06-21 DIAGNOSIS — R202 Paresthesia of skin: Secondary | ICD-10-CM | POA: Diagnosis present

## 2016-06-21 DIAGNOSIS — I959 Hypotension, unspecified: Secondary | ICD-10-CM | POA: Diagnosis present

## 2016-06-21 DIAGNOSIS — E876 Hypokalemia: Secondary | ICD-10-CM

## 2016-06-21 DIAGNOSIS — R209 Unspecified disturbances of skin sensation: Secondary | ICD-10-CM | POA: Diagnosis not present

## 2016-06-21 DIAGNOSIS — J918 Pleural effusion in other conditions classified elsewhere: Secondary | ICD-10-CM | POA: Diagnosis not present

## 2016-06-21 DIAGNOSIS — R29898 Other symptoms and signs involving the musculoskeletal system: Secondary | ICD-10-CM | POA: Diagnosis present

## 2016-06-21 DIAGNOSIS — R531 Weakness: Secondary | ICD-10-CM | POA: Diagnosis not present

## 2016-06-21 DIAGNOSIS — F17210 Nicotine dependence, cigarettes, uncomplicated: Secondary | ICD-10-CM | POA: Diagnosis present

## 2016-06-21 DIAGNOSIS — G510 Bell's palsy: Secondary | ICD-10-CM | POA: Diagnosis not present

## 2016-06-21 DIAGNOSIS — D72829 Elevated white blood cell count, unspecified: Secondary | ICD-10-CM | POA: Diagnosis present

## 2016-06-21 DIAGNOSIS — Z681 Body mass index (BMI) 19 or less, adult: Secondary | ICD-10-CM | POA: Diagnosis not present

## 2016-06-21 DIAGNOSIS — E43 Unspecified severe protein-calorie malnutrition: Secondary | ICD-10-CM | POA: Diagnosis present

## 2016-06-21 DIAGNOSIS — R5382 Chronic fatigue, unspecified: Secondary | ICD-10-CM | POA: Diagnosis not present

## 2016-06-21 DIAGNOSIS — R51 Headache: Secondary | ICD-10-CM | POA: Diagnosis present

## 2016-06-21 DIAGNOSIS — F419 Anxiety disorder, unspecified: Secondary | ICD-10-CM | POA: Diagnosis not present

## 2016-06-21 DIAGNOSIS — J209 Acute bronchitis, unspecified: Secondary | ICD-10-CM | POA: Diagnosis present

## 2016-06-21 LAB — ECHOCARDIOGRAM COMPLETE
CHL CUP MV DEC (S): 180
E decel time: 180 msec
EERAT: 10.46
FS: 35 % (ref 28–44)
HEIGHTINCHES: 62 in
IVS/LV PW RATIO, ED: 1.07
LA diam end sys: 28 mm
LA vol A4C: 13.4 ml
LADIAMINDEX: 2.04 cm/m2
LASIZE: 28 mm
LAVOL: 20.7 mL
LAVOLIN: 15.1 mL/m2
LV PW d: 8.12 mm — AB (ref 0.6–1.1)
LV TDI E'LATERAL: 10.9
LVEEAVG: 10.46
LVEEMED: 10.46
LVELAT: 10.9 cm/s
LVOT area: 1.77 cm2
LVOT diameter: 15 mm
MV Peak grad: 5 mmHg
MVPKAVEL: 98.5 m/s
MVPKEVEL: 114 m/s
TDI e' medial: 9.14
WEIGHTICAEL: 1463.85 [oz_av]

## 2016-06-21 LAB — RAPID URINE DRUG SCREEN, HOSP PERFORMED
Amphetamines: NOT DETECTED
BARBITURATES: NOT DETECTED
Benzodiazepines: POSITIVE — AB
COCAINE: NOT DETECTED
Opiates: NOT DETECTED
TETRAHYDROCANNABINOL: NOT DETECTED

## 2016-06-21 LAB — PROTIME-INR
INR: 1.06
PROTHROMBIN TIME: 13.9 s (ref 11.4–15.2)

## 2016-06-21 LAB — URINALYSIS, ROUTINE W REFLEX MICROSCOPIC
BILIRUBIN URINE: NEGATIVE
GLUCOSE, UA: NEGATIVE mg/dL
HGB URINE DIPSTICK: NEGATIVE
Ketones, ur: NEGATIVE mg/dL
Leukocytes, UA: NEGATIVE
Nitrite: NEGATIVE
PROTEIN: NEGATIVE mg/dL
Specific Gravity, Urine: 1.015 (ref 1.005–1.030)
pH: 6.5 (ref 5.0–8.0)

## 2016-06-21 LAB — TROPONIN I: Troponin I: <0.03 ng/mL (ref <0.03)

## 2016-06-21 LAB — I-STAT CG4 LACTIC ACID, ED: Lactic Acid, Venous: 1.03 mmol/L (ref 0.5–1.9)

## 2016-06-21 LAB — APTT: APTT: 30 s (ref 24–36)

## 2016-06-21 LAB — C-REACTIVE PROTEIN: CRP: 2.7 mg/dL — ABNORMAL HIGH (ref <1.0)

## 2016-06-21 LAB — SEDIMENTATION RATE: Sed Rate: 17 mm/hr (ref 0–22)

## 2016-06-21 MED ORDER — ACETAMINOPHEN 650 MG RE SUPP: 650.0000 mg | RECTAL | Status: DC | PRN

## 2016-06-21 MED ORDER — ONDANSETRON HCL 4 MG/2ML IJ SOLN
4.0000 mg | Freq: Once | INTRAMUSCULAR | Status: AC
  Administered 2016-06-21: 4 mg via INTRAVENOUS
  Filled 2016-06-21: qty 2

## 2016-06-21 MED ORDER — SENNOSIDES-DOCUSATE SODIUM 8.6-50 MG PO TABS
1.0000 | ORAL_TABLET | Freq: Every evening | ORAL | Status: DC | PRN
  Filled 2016-06-21: qty 1

## 2016-06-21 MED ORDER — ALBUTEROL SULFATE (2.5 MG/3ML) 0.083% IN NEBU: 2.5000 mg | INHALATION_SOLUTION | Freq: Four times a day (QID) | RESPIRATORY_TRACT | Status: DC | PRN

## 2016-06-21 MED ORDER — STROKE: EARLY STAGES OF RECOVERY BOOK
Freq: Once | Status: AC
  Administered 2016-06-21: 09:00:00
  Filled 2016-06-21: qty 1

## 2016-06-21 MED ORDER — ALPRAZOLAM 0.5 MG PO TABS
1.0000 mg | ORAL_TABLET | Freq: Three times a day (TID) | ORAL | Status: DC | PRN
  Administered 2016-06-21 (×2): 1 mg via ORAL
  Filled 2016-06-21 (×2): qty 2

## 2016-06-21 MED ORDER — HEPARIN SODIUM (PORCINE) 5000 UNIT/ML IJ SOLN
5000.0000 [IU] | Freq: Three times a day (TID) | INTRAMUSCULAR | Status: DC
  Filled 2016-06-21 (×3): qty 1

## 2016-06-21 MED ORDER — ALBUTEROL SULFATE HFA 108 (90 BASE) MCG/ACT IN AERS: 2.0000 | INHALATION_SPRAY | Freq: Four times a day (QID) | RESPIRATORY_TRACT | Status: DC | PRN

## 2016-06-21 MED ORDER — LORAZEPAM 2 MG/ML IJ SOLN: 1.0000 mg | Freq: Once | INTRAMUSCULAR | Status: DC

## 2016-06-21 MED ORDER — ACETAMINOPHEN 325 MG PO TABS
650.0000 mg | ORAL_TABLET | ORAL | Status: DC | PRN
  Administered 2016-06-22: 650 mg via ORAL
  Filled 2016-06-21: qty 2

## 2016-06-21 MED ORDER — SODIUM CHLORIDE 0.9 % IV SOLN
INTRAVENOUS | Status: DC
Start: 2016-06-21 — End: 2016-06-22
  Administered 2016-06-21: 09:00:00 via INTRAVENOUS

## 2016-06-21 MED ORDER — DIAZEPAM 2 MG PO TABS
2.0000 mg | ORAL_TABLET | Freq: Once | ORAL | Status: AC
  Administered 2016-06-21: 2 mg via ORAL
  Filled 2016-06-21: qty 1

## 2016-06-21 MED ORDER — POTASSIUM CHLORIDE CRYS ER 20 MEQ PO TBCR
20.0000 meq | EXTENDED_RELEASE_TABLET | Freq: Once | ORAL | Status: AC
  Administered 2016-06-21: 20 meq via ORAL
  Filled 2016-06-21: qty 1

## 2016-06-21 MED ORDER — LORAZEPAM 2 MG/ML IJ SOLN
1.0000 mg | Freq: Once | INTRAMUSCULAR | Status: AC
  Administered 2016-06-21: 1 mg via INTRAVENOUS
  Filled 2016-06-21: qty 1

## 2016-06-21 NOTE — Progress Notes (Signed)
Pt was brought down for 4th attempt at getting her MRI's completed.  At the start of the brain scan, pt started vomiting while in the scanner.  Pt was brought out, sat up and given an emesis bag.  Noticed pt had a wet cough, pt was removed from scanner and put into her bed.  RN notified of possible aspiration.  If these scans need to be completed, anesthesia is recommended.

## 2016-06-21 NOTE — Progress Notes (Addendum)
Per PA of attending MD, ok for pt to have small/light snack and oral fluid. Pt will be able to eat after MRI. RN explained the risk of aspiration and such. Pt/family understand.   MRI staff call and will be picking up pt in 30 minutes. Prn meds giving prior to MRI per order at this time.   Sim BoastHavy, RN

## 2016-06-21 NOTE — H&P (Signed)
History and Physical    Pamela CongLauren P Sundstrom XBJ:[id number] DOB: 09/14/1981 DOA: 06/20/2016   PCP: Burnis MedinFULBRIGHT, VIRGINIA E, PA-C   Patient coming from:  Home   Chief Complaint:Bilateral upper  weakness  And numbness, Left leg weakness with numbness   HPI: Pamela Bailey is a 35 y.o. female with a history of depression, asthma, chronic headache, tobacco abuse, cleft palate with surgery, longstanding right facial weakness, presenting to the ED with LLE weakness and B arm weakness with numbness  for about one month, amelliorated with hot baths. Last episode at 10 pm prior to presentation. She had been seen by a PCP a month prior, which per chart, an MRI was performed with negative results (no information of theat imaging study is available for review. ) She was referred to to Neurology evaluation as outpatient.  Other history  Cannot be obtained at this time as patient is heavily sedated after Ativan dose.   ED Course:  BP 107/73   Pulse 81   Temp 97.7 F (36.5 C) (Oral)   Resp 20   Ht 5\' 2"  (1.575 m)   Wt 41.5 kg (91 lb 7.9 oz)   LMP 06/03/2014   SpO2 94%   BMI 16.73 kg/m     Neuro consultation was obtained, with ongoing workup MRI C, T< L spine was ordered, suboptimal as patient continued to move, and Ativan was given . Results were as follows:  1. No definite abnormal cervical cord signal. Artifact from C4 through C7. 2. Small disc bulges at the C3 through C5 levels and C4-5 annular fissure. No significant canal stenosis. No cord compression. 3. No significant disc displacement, foraminal narrowing, or canal stenosis of the lumbar spine. Repetition of studies recommended  CT head non conclusive due to artifacts Tn less than 0.03  WBC 21.6 Lactic acid 1.03, CRP 2.7 UA    Review of Systems: Unable to obtain due to above   Past Medical History:  Diagnosis Date  . Abnormal Pap smear    colpo 5/09  . Anxiety   . Asthma   . Cleft hard palate   . Cleft lip   . Depression   .  Endometriosis   . Facial palsy   . Headache(784.0)   . Ovarian cyst   . PONV (postoperative nausea and vomiting)     Past Surgical History:  Procedure Laterality Date  . ABDOMINAL HYSTERECTOMY    . ABDOMINAL SURGERY    . COSMETIC SURGERY    . LAPAROSCOPY    . NO PAST SURGERIES    . SALPINGECTOMY    . TYMPANOSTOMY TUBE PLACEMENT      Social History Social History   Social History  . Marital status: Single    Spouse name: N/A  . Number of children: N/A  . Years of education: N/A   Occupational History  . Not on file.   Social History Main Topics  . Smoking status: Current Every Day Smoker    Packs/day: 0.50    Years: 8.00    Types: Cigarettes  . Smokeless tobacco: Never Used  . Alcohol use No  . Drug use: No  . Sexual activity: Yes    Birth control/ protection: None   Other Topics Concern  . Not on file   Social History Narrative  . No narrative on file     Allergies  Allergen Reactions  . Vancomycin Other (See Comments)    Acute kidney injury   . Azithromycin Other (See Comments)  Caused renal failure  . Amoxicillin-Pot Clavulanate Nausea And Vomiting and Rash    Has patient had a PCN reaction causing immediate rash, facial/tongue/throat swelling, SOB or lightheadedness with hypotension: No Has patient had a PCN reaction causing severe rash involving mucus membranes or skin necrosis: No Has patient had a PCN reaction that required hospitalization No Has patient had a PCN reaction occurring within the last 10 years: No If all of the above answers are "NO", then may proceed with Cephalosporin use.     Family History  Problem Relation Age of Onset  . Hypertension Maternal Grandfather   . Anesthesia problems Neg Hx       Prior to Admission medications   Medication Sig Start Date End Date Taking? Authorizing Provider  albuterol (PROVENTIL HFA;VENTOLIN HFA) 108 (90 BASE) MCG/ACT inhaler Inhale 2 puffs into the lungs every 6 (six) hours as needed  for wheezing or shortness of breath.    Historical Provider, MD  ALPRAZolam Prudy Feeler) 1 MG tablet Take 1 mg by mouth 3 (three) times daily as needed for anxiety.     Historical Provider, MD  cyclobenzaprine (FLEXERIL) 10 MG tablet Take 1 tablet (10 mg total) by mouth 2 (two) times daily as needed for muscle spasms. Patient not taking: Reported on 08/18/2015 06/28/14   Elwin Mocha, MD  ibuprofen (ADVIL,MOTRIN) 800 MG tablet Take 1 tablet (800 mg total) by mouth 3 (three) times daily. Patient not taking: Reported on 08/18/2015 01/12/15   Ladona Mow, PA-C  ondansetron (ZOFRAN) 4 MG tablet Take 1 tablet (4 mg total) by mouth every 6 (six) hours. 08/18/15   Marny Lowenstein, PA-C  oxyCODONE-acetaminophen (PERCOCET) 5-325 MG per tablet Take 1 tablet by mouth every 4 (four) hours as needed. Patient not taking: Reported on 08/18/2015 09/03/14   Gilda Crease, MD  traMADol (ULTRAM) 50 MG tablet Take 1 tablet (50 mg total) by mouth every 6 (six) hours as needed. Patient not taking: Reported on 09/03/2014 06/24/14   Cathren Laine, MD    Physical Exam:    Vitals:   06/21/16 0600 06/21/16 0615 06/21/16 0630 06/21/16 0832  BP: (!) 88/59 97/75 94/62  107/73  Pulse: 77 80 81   Resp: 20 21 21 20   Temp:      TempSrc:      SpO2: 96% 94% 94%   Weight:      Height:           Constitutional: NAD, sedated, unable to rouse to sound or touch. Very thin appearing  Vitals:   06/21/16 0600 06/21/16 0615 06/21/16 0630 06/21/16 0832  BP: (!) 88/59 97/75 94/62  107/73  Pulse: 77 80 81   Resp: 20 21 21 20   Temp:      TempSrc:      SpO2: 96% 94% 94%   Weight:      Height:       Eyes: PERRL, lids and conjunctivae normal ENMT: negative  Neck: normal, supple, no masses, no thyromegaly Respiratory: clear to auscultation bilaterally, no wheezing, no crackles. Normal respiratory effort. No accessory muscle use.  Cardiovascular: Regular rate and rhythm, no murmurs / rubs / gallops. No extremity edema. 2+ pedal  pulses. No carotid bruits.  Abdomen: no apparent tenderness, no masses palpated. No hepatosplenomegaly. Bowel sounds positive.  Musculoskeletal: no clubbing / cyanosis. No joint deformity upper and lower extremities. Good ROM, no contractures. Normal muscle tone.  Skin: no rashes, lesions, ulcers.  Neurologic: unable to perform due to heavy sedation Per Neuro evaluation , "  4+/5 strengh in the left leg with the exception of hip flexors(4+/5) and plantarflexion(3/5)"  Psychiatric: Normal judgment and insight. Alert and oriented x 3. Normal mood.     Labs on Admission: I have personally reviewed following labs and imaging studies  CBC:  Recent Labs Lab 06/20/16 2311 06/20/16 2320  WBC 21.6*  --   NEUTROABS 16.8*  --   HGB 14.0 14.6  HCT 41.5 43.0  MCV 95.6  --   PLT 244  --     Basic Metabolic Panel:  Recent Labs Lab 06/20/16 2311 06/20/16 2320  NA 140 141  K 3.3* 3.2*  CL 107 103  CO2 25  --   GLUCOSE 146* 143*  BUN 12 14  CREATININE 0.70 0.60  CALCIUM 9.7  --     GFR: Estimated Creatinine Clearance: 63.7 mL/min (by C-G formula based on SCr of 0.6 mg/dL).  Liver Function Tests:  Recent Labs Lab 06/20/16 2311  AST 21  ALT 14  ALKPHOS 82  BILITOT 0.3  PROT 6.5  ALBUMIN 4.0   No results for input(s): LIPASE, AMYLASE in the last 168 hours. No results for input(s): AMMONIA in the last 168 hours.  Coagulation Profile:  Recent Labs Lab 06/20/16 2311  INR 1.02    Cardiac Enzymes: No results for input(s): CKTOTAL, CKMB, CKMBINDEX, TROPONINI in the last 168 hours.  BNP (last 3 results) No results for input(s): PROBNP in the last 8760 hours.  HbA1C: No results for input(s): HGBA1C in the last 72 hours.  CBG:  Recent Labs Lab 06/20/16 2311  GLUCAP 156*    Lipid Profile: No results for input(s): CHOL, HDL, LDLCALC, TRIG, CHOLHDL, LDLDIRECT in the last 72 hours.  Thyroid Function Tests: No results for input(s): TSH, T4TOTAL, FREET4, T3FREE,  THYROIDAB in the last 72 hours.  Anemia Panel: No results for input(s): VITAMINB12, FOLATE, FERRITIN, TIBC, IRON, RETICCTPCT in the last 72 hours.  Urine analysis:    Component Value Date/Time   COLORURINE YELLOW 08/18/2015 1705   APPEARANCEUR CLEAR 08/18/2015 1705   LABSPEC 1.015 08/18/2015 1705   PHURINE 6.0 08/18/2015 1705   GLUCOSEU NEGATIVE 08/18/2015 1705   HGBUR TRACE (A) 08/18/2015 1705   BILIRUBINUR NEGATIVE 08/18/2015 1705   KETONESUR NEGATIVE 08/18/2015 1705   PROTEINUR NEGATIVE 08/18/2015 1705   UROBILINOGEN 0.2 08/25/2013 1840   NITRITE NEGATIVE 08/18/2015 1705   LEUKOCYTESUR NEGATIVE 08/18/2015 1705    Sepsis Labs: @LABRCNTIP (procalcitonin:4,lacticidven:4) )No results found for this or any previous visit (from the past 240 hour(s)).   Radiological Exams on Admission: Mr Cervical Spine Wo Contrast  Result Date: 06/21/2016 CLINICAL DATA:  35 y/o F; history of left leg weakness and numbness as well as bilateral arm weakness and numbness for the past month. EXAM: MRI CERVICAL AND LUMBAR SPINE WITHOUT CONTRAST TECHNIQUE: Sagittal T1, sagittal inversion recovery, and sagittal T2 weighted sequences of the lumbar spine were acquired. Sagittal T2, sagittal T1, sagittal inversion recovery, axial T2 MERGE sequences of the cervical spine were acquired. The patient was unable to continue the exam. COMPARISON:  01/21/2015 lumbar MRI. 12/19/2015 CT of abdomen and pelvis. FINDINGS: MRI CERVICAL SPINE FINDINGS Moderate motion degradation of all sequences. Alignment: Straightening of cervical lordosis with slight reversal at C4-5. Vertebrae: No fracture, evidence of discitis, or bone lesion. Cord: No definite abnormal cord signal on the sagittal T2 weighted sequence, artifact from C4 through C7. Other sequences are motion degraded. Posterior Fossa, vertebral arteries, paraspinal tissues: Negative. Disc levels: There are small disc bulges at the  C3-4 and C4-5 level. At C4-5 there is an  annular fissure. There is no significant canal stenosis or cord compression. MRI LUMBAR SPINE FINDINGS Moderate motion degradation of all sequences. Segmentation:  Standard. Alignment:  Physiologic. Vertebrae:  No loss of vertebral body height. Conus medullaris: The conus extends to approximately the T12-L1 level. No definite abnormal cord signal. Paraspinal and other soft tissues: No T1 signal abnormality. Disc levels: No significant disc displacement, foraminal narrowing, or canal stenosis. IMPRESSION: Moderate motion degradation of all sequences. Consider repeat imaging if clinically needed, possibly with greater sedation or anesthesia, when patient is able. 1. No definite abnormal cervical cord signal. Artifact from C4 through C7. 2. Small disc bulges at the C3 through C5 levels and C4-5 annular fissure. No significant canal stenosis. No cord compression. 3. No significant disc displacement, foraminal narrowing, or canal stenosis of the lumbar spine. Electronically Signed   By: Mitzi Hansen M.D.   On: 06/21/2016 05:20   Mr Lumbar Spine Wo Contrast  Result Date: 06/21/2016 CLINICAL DATA:  35 y/o F; history of left leg weakness and numbness as well as bilateral arm weakness and numbness for the past month. EXAM: MRI CERVICAL AND LUMBAR SPINE WITHOUT CONTRAST TECHNIQUE: Sagittal T1, sagittal inversion recovery, and sagittal T2 weighted sequences of the lumbar spine were acquired. Sagittal T2, sagittal T1, sagittal inversion recovery, axial T2 MERGE sequences of the cervical spine were acquired. The patient was unable to continue the exam. COMPARISON:  01/21/2015 lumbar MRI. 12/19/2015 CT of abdomen and pelvis. FINDINGS: MRI CERVICAL SPINE FINDINGS Moderate motion degradation of all sequences. Alignment: Straightening of cervical lordosis with slight reversal at C4-5. Vertebrae: No fracture, evidence of discitis, or bone lesion. Cord: No definite abnormal cord signal on the sagittal T2 weighted  sequence, artifact from C4 through C7. Other sequences are motion degraded. Posterior Fossa, vertebral arteries, paraspinal tissues: Negative. Disc levels: There are small disc bulges at the C3-4 and C4-5 level. At C4-5 there is an annular fissure. There is no significant canal stenosis or cord compression. MRI LUMBAR SPINE FINDINGS Moderate motion degradation of all sequences. Segmentation:  Standard. Alignment:  Physiologic. Vertebrae:  No loss of vertebral body height. Conus medullaris: The conus extends to approximately the T12-L1 level. No definite abnormal cord signal. Paraspinal and other soft tissues: No T1 signal abnormality. Disc levels: No significant disc displacement, foraminal narrowing, or canal stenosis. IMPRESSION: Moderate motion degradation of all sequences. Consider repeat imaging if clinically needed, possibly with greater sedation or anesthesia, when patient is able. 1. No definite abnormal cervical cord signal. Artifact from C4 through C7. 2. Small disc bulges at the C3 through C5 levels and C4-5 annular fissure. No significant canal stenosis. No cord compression. 3. No significant disc displacement, foraminal narrowing, or canal stenosis of the lumbar spine. Electronically Signed   By: Mitzi Hansen M.D.   On: 06/21/2016 05:20   Ct Head Code Stroke W/o Cm  Result Date: 06/20/2016 CLINICAL DATA:  Code stroke.  Left-sided deficits. EXAM: CT HEAD WITHOUT CONTRAST TECHNIQUE: Contiguous axial images were obtained from the base of the skull through the vertex without intravenous contrast. COMPARISON:  09/04/2008 CT head. FINDINGS: Brain: There is a band of streak artifact partially obscuring the anterior temporal lobes an cerebellum. No evidence of acute infarction, hemorrhage, hydrocephalus, extra-axial collection or mass lesion/mass effect. Vascular: No hyperdense vessel or unexpected calcification. Skull: Normal. Negative for fracture or focal lesion. Sinuses/Orbits: Partially  visualized postsurgical changes within the nasopharynx. Partial opacification of right maxillary  sinus with thickening of the walls compatible with chronic inflammatory changes and mucosal thickening of anterior ethmoid air cells. Underpneumatized and sclerotic left mastoid air cells compatible sequelae of chronic otomastoiditis. Other: None. ASPECTS [facility] Stroke Program Early CT Score) - Ganglionic level infarction (caudate, lentiform nuclei, internal capsule, insula, M1-M3 cortex): 7 - Supraganglionic infarction (M4-M6 cortex): 3 Total score (0-10 with 10 being normal): 10 IMPRESSION: 1. Band of streak artifact partially obscures anterior temporal lobes and cerebellum. No acute process identified. 2. ASPECTS is 10 3. Paranasal sinus disease. These results were called by telephone at the time of interpretation on 06/20/2016 at 11:28 pm to Dr. Amada Jupiter who verbally acknowledged these results. Electronically Signed   By: Mitzi Hansen M.D.   On: 06/20/2016 23:28    EKG: Independently reviewed.  Assessment/Plan Active Problems:   Headache   Weakness   Anxiety   Chronic fatigue   Facial palsy   Left leg numbness   Numbness of upper extremity   Weakness of upper extremity   Arm weakness   Left sided weakness and numbness, unlikely stroke per Neuro report. Suspected radicular ?S1. However, due to leukocytosis with WBC 21.6 and fever up to 101, epidural abscess versus other infectious process needs to be ruled out. CT head inconclusive. CRP 2.7 Sed Rate 17  Lactic acid 1.03 No tPA given as stroke is not suspected. Despite Elevated white count, meningitis is low in the differential, although not excluded.  Neuro follow up appreciated. Discussed with Dr. Lyda Perone, will pursue  more aggressive intervention pending on the results  Admit to tele obs Stroke order set  Stat MRI brain, MRI C, T spine stat 2 D echo  Blood cultures Repeat CBC in am  Will hold antibiotics for now  Hypokalemia   EKG SR,Tn less than 0.03 Current K  3.2 Received K replenishment in ED Check Mg   EKG in am  Repeat CMET in am  Hypotension: Unclear cause, rule out infection BP 94/62, currently afebrile. WBC elevated, infectious process being ruled out (see above)  Monitor with EKG  IVF at 100 cc/h    Anxiety Continue meds with Xanax    DVT prophylaxis: Heparin Code Status:   Full   Family Communication:  None  Disposition Plan: Expect patient to be discharged to home after condition improves Consults called:   Neurology  Admission status:Tele  Obs    [facility] E, PA-C [facility]   If 7PM-7AM, please contact night-coverage [email or web address] Password [email or web address]  06/21/2016, 8:35 AM

## 2016-06-21 NOTE — ED Notes (Signed)
Patient transported to MRI 

## 2016-06-21 NOTE — Progress Notes (Signed)
Patient arrived to unit from ED at this time. Safety precautions and orders reviewed with patient. TELE applied and confirmed. VSS. MD paged r/t pt arrival. Will continue to monitor.   Sim BoastHavy, RN

## 2016-06-21 NOTE — Progress Notes (Addendum)
The PA of Dr. Konrad DoloresMerrell called RN with order at this time for Zofran and ativan to be given 30 minutes prior to MRI. MRI staff call and notified of change. PA ok for pt to be NPO until SLP eval.    Sim BoastHavy, RN.

## 2016-06-21 NOTE — Progress Notes (Signed)
  Echocardiogram 2D Echocardiogram has been performed.  Cathie BeamsGREGORY, Kristofor Michalowski 06/21/2016, 4:45 PM

## 2016-06-21 NOTE — Progress Notes (Signed)
MRI called r/t pt's status during MRI. Pt will be return to room. Hold oral at this time until MD return call.   Sim BoastHavy, RN

## 2016-06-21 NOTE — Progress Notes (Signed)
RN paged SLP twice r/t swallow eval today. Once approximately at 1530 and 2nd call at 1715PM. RN also left message. Awaiting for call back.  Sim BoastHavy, RN

## 2016-06-21 NOTE — ED Notes (Signed)
Unable to swallow pills at this time, pt is hard arousal after given Ativan IV for MRI.

## 2016-06-21 NOTE — Progress Notes (Signed)
Received report from Dr. Madilyn Hookees Ms. Pamela HughsLeclear is a 35 year old female with history of right-sided facial paralysis; who presented with complaints of  left arm/leg weakness and numbness. Evaluated by neurology who recommended MRI. Patient was given Ativan for MRI studies to be completed, but studies were reported to be suboptimal as patient continued to move. Lab work reveals WBC 21.6, lactic acid 1.03, C-reactive protein elevated at 2.7. UA pending. Neurology recommended a repeat scans when able, but recommended admission for observation. Patient given potassium chloride and IV fluids.

## 2016-06-21 NOTE — ED Notes (Addendum)
Pt brought to ED by [facility] from home for c/o on increase weakness and numbness on her left side. Hx of right side facial droop on the right side. LSW at 22 pm when she got increase weakness going to her left arm. Pt states she had a fever around 9:30 pm and got a Motrin around 2140. NIHSS 3. Passed swallow screen. Neuro checks q2h, next due at 1015. Patient went to MRI but unable to tolerate, given 2mg  ativan. Patient is now lethargic due to medication and Dr. Konrad DoloresMerrell aware. Hospitalist will talk to neuro to see if need for repeat MRI. Eyes open to stimulation.

## 2016-06-22 ENCOUNTER — Inpatient Hospital Stay (HOSPITAL_COMMUNITY): Payer: Medicaid Other

## 2016-06-22 DIAGNOSIS — R531 Weakness: Secondary | ICD-10-CM

## 2016-06-22 DIAGNOSIS — F419 Anxiety disorder, unspecified: Secondary | ICD-10-CM

## 2016-06-22 DIAGNOSIS — R5382 Chronic fatigue, unspecified: Secondary | ICD-10-CM

## 2016-06-22 DIAGNOSIS — G510 Bell's palsy: Secondary | ICD-10-CM

## 2016-06-22 LAB — BASIC METABOLIC PANEL
Anion gap: 5 (ref 5–15)
BUN: 15 mg/dL (ref 6–20)
CALCIUM: 9.2 mg/dL (ref 8.9–10.3)
CO2: 26 mmol/L (ref 22–32)
Chloride: 105 mmol/L (ref 101–111)
Creatinine, Ser: 0.67 mg/dL (ref 0.44–1.00)
GFR calc Af Amer: >60 mL/min (ref >60)
GLUCOSE: 119 mg/dL — AB (ref 65–99)
POTASSIUM: 3.8 mmol/L (ref 3.5–5.1)
SODIUM: 136 mmol/L (ref 135–145)

## 2016-06-22 LAB — LIPID PANEL
CHOL/HDL RATIO: 5.4 ratio
CHOLESTEROL: 178 mg/dL (ref 0–200)
HDL: 33 mg/dL — ABNORMAL LOW (ref >40)
LDL CALC: 120 mg/dL — AB (ref 0–99)
Triglycerides: 127 mg/dL (ref <150)
VLDL: 25 mg/dL (ref 0–40)

## 2016-06-22 LAB — HIV ANTIBODY (ROUTINE TESTING W REFLEX): HIV SCREEN 4TH GENERATION: NONREACTIVE

## 2016-06-22 LAB — CBC
HCT: 42.8 % (ref 36.0–46.0)
Hemoglobin: 14 g/dL (ref 12.0–15.0)
MCH: 31.7 pg (ref 26.0–34.0)
MCHC: 32.7 g/dL (ref 30.0–36.0)
MCV: 96.8 fL (ref 78.0–100.0)
PLATELETS: 242 10*3/uL (ref 150–400)
RBC: 4.42 MIL/uL (ref 3.87–5.11)
RDW: 12.2 % (ref 11.5–15.5)
WBC: 16.1 10*3/uL — AB (ref 4.0–10.5)

## 2016-06-22 LAB — VITAMIN B12: Vitamin B-12: 390 pg/mL (ref 180–914)

## 2016-06-22 LAB — SAVE SMEAR

## 2016-06-22 LAB — MAGNESIUM: MAGNESIUM: 2.1 mg/dL (ref 1.7–2.4)

## 2016-06-22 MED ORDER — LEVOFLOXACIN 500 MG PO TABS: 500.0000 mg | ORAL_TABLET | Freq: Every day | ORAL | 0 refills | Status: DC

## 2016-06-22 MED ORDER — NICOTINE 21 MG/24HR TD PT24: 21.0000 mg | MEDICATED_PATCH | Freq: Every day | TRANSDERMAL | 0 refills | Status: DC

## 2016-06-22 MED ORDER — TRAMADOL HCL 50 MG PO TABS: 50.0000 mg | ORAL_TABLET | Freq: Four times a day (QID) | ORAL | 0 refills | Status: DC | PRN

## 2016-06-22 MED ORDER — LEVOFLOXACIN 500 MG PO TABS
500.0000 mg | ORAL_TABLET | Freq: Every day | ORAL | Status: DC
  Filled 2016-06-22: qty 1

## 2016-06-22 MED ORDER — NICOTINE 21 MG/24HR TD PT24
21.0000 mg | MEDICATED_PATCH | Freq: Every day | TRANSDERMAL | Status: DC
  Administered 2016-06-22: 21 mg via TRANSDERMAL
  Filled 2016-06-22: qty 1

## 2016-06-22 MED ORDER — CYCLOBENZAPRINE HCL 10 MG PO TABS: 10.0000 mg | ORAL_TABLET | Freq: Two times a day (BID) | ORAL | 0 refills | Status: DC | PRN

## 2016-06-22 NOTE — Progress Notes (Signed)
PT Cancellation Note  Patient Details Name: Judi CongLauren P Poellnitz MRN: [id number] DOB: 05/23/1981   Cancelled Treatment:    Reason Eval/Treat Not Completed: PT screened, no needs identified, will sign off. Spoke with OT, patient independent with all activity, no acute PT needs   Fabio AsaWerner, Bryttani Blew J 06/22/2016, 9:18 AM Charlotte Crumbevon Yakelin Grenier, PT DPT  [phone]

## 2016-06-22 NOTE — Evaluation (Signed)
Occupational Therapy Evaluation and Discharge Patient Details Name: Pamela Bailey MRN: [id number] DOB: 1980/10/14 Today's Date: 06/22/2016    History of Present Illness Pamela Bailey is a 35 y.o. female with a history of depression, asthma, chronic headache, tobacco abuse, cleft palate with surgery, longstanding right facial weakness, presenting to the ED with LLE weakness and B arm weakness with numbness  for about one month, amelliorated with hot baths. MRI: C3-C5 small disc bulges (no cord compression); lumber spine negative, brain negative; thoraic spine--Central disc protrusion at T10-T11, indenting the ventral spinal canal with no greater than mild spinal canal narrowing   Clinical Impression   This 35 yo female admitted with above presents to acute OT at a Mod I to Independent level, no further OT needs identified and made PT aware I did not see any PT issues that needed to be addressed. Acute OT will sign off.    Follow Up Recommendations  No OT follow up    Equipment Recommendations  Other (comment) (RW)       Precautions / Restrictions Precautions Precautions: None Restrictions Weight Bearing Restrictions: No      Mobility Bed Mobility Overal bed mobility: Independent                Transfers Overall transfer level: Independent Equipment used: None             General transfer comment: Mod I with flight of stairs (using handrail)    Balance Overall balance assessment: Independent                                          ADL Overall ADL's : Independent                                                       Pertinent Vitals/Pain Pain Assessment: No/denies pain     Hand Dominance Right   Extremity/Trunk Assessment Upper Extremity Assessment Upper Extremity Assessment: Overall WFL for tasks assessed   Lower Extremity Assessment Lower Extremity Assessment: Overall WFL for tasks assessed        Communication Communication Communication: No difficulties   Cognition Arousal/Alertness: Awake/alert Behavior During Therapy: WFL for tasks assessed/performed Overall Cognitive Status: Within Functional Limits for tasks assessed                                Home Living Family/patient expects to be discharged to:: Private residence Living Arrangements: Spouse/significant other;Children Available Help at Discharge: Family;Available PRN/intermittently Type of Home: House Home Access: Stairs to enter [employer] of Steps: 4-5 Entrance Stairs-Rails: Right Home Layout: One level     Bathroom Shower/Tub: Tub/shower unit                    Prior Functioning/Environment Level of Independence: Independent                       OT Goals(Current goals can be found in the care plan section) Acute Rehab OT Goals Patient Stated Goal: to go home today  OT Frequency:  End of Session Equipment Utilized During Treatment:  (none) Nurse Communication: Mobility status (pt wants to know if she can go outside)  Activity Tolerance: Patient tolerated treatment well Patient left: in bed;with call bell/phone within reach;with family/visitor present   Time: [id number] OT Time Calculation (min): 20 min Charges:  OT General Charges $OT Visit: 1 Procedure OT Evaluation $OT Eval Moderate Complexity: 1 Procedure  Evette GeorgesLeonard, Sieara Bremer Eva [phone] 06/22/2016, 9:22 AM

## 2016-06-22 NOTE — Evaluation (Signed)
Clinical/Bedside Swallow Evaluation Patient Details  Name: Pamela CongLauren P Laban MRN: [id number] Date of Birth: 07/21/1981  Today's Date: 06/22/2016 Time: SLP Start Time (ACUTE ONLY): 78290905 SLP Stop Time (ACUTE ONLY): 0920 SLP Time Calculation (min) (ACUTE ONLY): 15 min  Past Medical History:  Past Medical History:  Diagnosis Date  . Abnormal Pap smear    colpo 5/09  . Anxiety   . Asthma   . Cleft hard palate   . Cleft lip   . Depression   . Endometriosis   . Facial palsy   . Headache(784.0)   . Ovarian cyst   . PONV (postoperative nausea and vomiting)    Past Surgical History:  Past Surgical History:  Procedure Laterality Date  . ABDOMINAL HYSTERECTOMY    . ABDOMINAL SURGERY    . COSMETIC SURGERY    . LAPAROSCOPY    . NO PAST SURGERIES    . SALPINGECTOMY    . TYMPANOSTOMY TUBE PLACEMENT     HPI:  Pt is a 35 y.o. female with a history of depression, asthma, chronic headache, tobacco abuse, cleft palate with surgery, longstanding right facial weakness, presenting to the ED with LLE weakness and B arm weakness with numbness  for about one month, amelliorated with hot baths. MRI: C3-C5 small disc bulges (no cord compression); lumber spine negative, brain negative; thoraic spine--Central disc protrusion at T10-T11, indenting the ventral spinal canal with no greater than mild spinal canal narrowing   Assessment / Plan / Recommendation Clinical Impression  Pt with h/o cleft lip/palate repair presents with normal vocal quality and strong, congested cough. Pt has right sided facial droop and reduced strength that she and her mother report as baseline. She consumed PO trials of thin liquid, pureed and regular solids. During initial sips of thin liquid pt displayed no difficulty. Pt had 1 episode of immediate coughing following thin liquid trial via straw. SLP challenged pt with 3oz water test and no further coughing was observed. No overt s/s of aspiration were observed during pureed and  regular solids. Recommend regular diet and thin liquid. Will f/u for diet tolerance.    Aspiration Risk  Mild aspiration risk    Diet Recommendation Regular;Thin liquid   Liquid Administration via: Cup;Other (Comment) (pt reports she doesnt typically use straws) Medication Administration: Whole meds with liquid Supervision: Patient able to self feed;Intermittent supervision to cue for compensatory strategies Compensations: Minimize environmental distractions;Slow rate;Small sips/bites Postural Changes: Seated upright at 90 degrees;Remain upright for at least 30 minutes after po intake    Other  Recommendations Oral Care Recommendations: Oral care BID   Follow up Recommendations 24 hour supervision/assistance      Frequency and Duration min 2x/week  2 weeks       Prognosis Prognosis for Safe Diet Advancement: Good      Swallow Study   General HPI: Pt is a 35 y.o. female with a history of depression, asthma, chronic headache, tobacco abuse, cleft palate with surgery, longstanding right facial weakness, presenting to the ED with LLE weakness and B arm weakness with numbness  for about one month, amelliorated with hot baths. MRI: C3-C5 small disc bulges (no cord compression); lumber spine negative, brain negative; thoraic spine--Central disc protrusion at T10-T11, indenting the ventral spinal canal with no greater than mild spinal canal narrowing Type of Study: Bedside Swallow Evaluation Diet Prior to this Study: Regular;Thin liquids Temperature Spikes Noted: No Respiratory Status: Room air History of Recent Intubation: No Behavior/Cognition: Cooperative;Alert Oral Cavity Assessment: Within Functional Limits  Oral Care Completed by SLP: No Oral Cavity - Dentition: Adequate natural dentition;Poor condition Vision: Functional for self-feeding Self-Feeding Abilities: Able to feed self Patient Positioning: Upright in bed Baseline Vocal Quality: Normal Volitional Cough:  Strong;Congested Volitional Swallow: Able to elicit    Oral/Motor/Sensory Function Overall Oral Motor/Sensory Function: Moderate impairment Facial ROM: Reduced right Facial Symmetry: Abnormal symmetry right Facial Strength: Reduced right Lingual ROM: Reduced right   Ice Chips Ice chips: Not tested   Thin Liquid Thin Liquid: Impaired Presentation: Cup;Straw Oral Phase Impairments: Reduced labial seal Pharyngeal  Phase Impairments: Cough - Immediate    Nectar Thick Nectar Thick Liquid: Not tested   Honey Thick Honey Thick Liquid: Not tested   Puree Puree: Within functional limits Presentation: Spoon;Self Fed   Solid   GO   Solid: Within functional limits Presentation: Self Fed       Tollie Eth, Student SLP  Caryl Never 06/22/2016,11:05 AM

## 2016-06-22 NOTE — Progress Notes (Signed)
Patient give discharge instructions along with [employer] of medications ordered. All questions and concerns addressed. Take by wheelchair accompanied by staff and spouse.

## 2016-06-22 NOTE — Progress Notes (Signed)
SLP Cancellation Note  Patient Details Name: Pamela Bailey MRN: [id number] DOB: 04/05/1981   Cancelled treatment:       Reason Eval/Treat Not Completed: SLP screened, no needs identified, will sign off. SLP did cognitive screen and no acute deficits identified.   Tollie EthHaleigh Ragan Shelvie Bailey, Student SLP Pamela Bailey 06/22/2016, 10:59 AM

## 2016-06-22 NOTE — Progress Notes (Signed)
Patient requests to go outside to have a smoke at this time. No order from MD for pt to be outside at this time. RN explained to pt that this is a nonsmoking facility and will ask MD a patch at this time. Will continue to monitor.   Kamoni Gentles, RN 

## 2016-06-22 NOTE — Discharge Summary (Addendum)
Physician Discharge Summary   Patient ID: Pamela Bailey MRN: [id number] DOB/AGE: Aug 11, 1981 35 y.o.  Admit date: 06/20/2016 Discharge date: 06/22/2016  Primary Care Physician:  Elisabeth Cara, PA-C  Discharge Diagnoses:    Left leg weakness and numbness completely resolved, exact etiology unknown . Headache . Anxiety . Chronic fatigue      Consults:  Neurology, Dr. Leonel Ramsay  Recommendations for Outpatient Follow-up:  1. Please follow Lyme's PCR, hepatitis B, HIV. patient will need likely EMG or nerve conduction study as an outpatient 2. Please repeat CBC/BMET at next visit   DIET: Heart healthy diet    Allergies:   Allergies  Allergen Reactions  . Azithromycin Other (See Comments)    Caused renal failure  . Vancomycin Other (See Comments)    Acute kidney injury   . Amoxicillin-Pot Clavulanate Nausea And Vomiting, Rash and Other (See Comments)    Has patient had a PCN reaction causing immediate rash, facial/tongue/throat swelling, SOB or lightheadedness with hypotension: No Has patient had a PCN reaction causing severe rash involving mucus membranes or skin necrosis: No Has patient had a PCN reaction that required hospitalization No Has patient had a PCN reaction occurring within the last 10 years: No If all of the above answers are "NO", then may proceed with Cephalosporin use.      DISCHARGE MEDICATIONS: Current Discharge Medication List    START taking these medications   Details  levofloxacin (LEVAQUIN) 500 MG tablet Take 1 tablet (500 mg total) by mouth daily. X 1 week Qty: 7 tablet, Refills: 0    nicotine (NICODERM CQ - DOSED IN MG/24 HOURS) 21 mg/24hr patch Place 1 patch (21 mg total) onto the skin daily. Qty: 28 patch, Refills: 0      CONTINUE these medications which have CHANGED   Details  cyclobenzaprine (FLEXERIL) 10 MG tablet Take 1 tablet (10 mg total) by mouth 2 (two) times daily as needed for muscle spasms. Qty: 20 tablet,  Refills: 0    traMADol (ULTRAM) 50 MG tablet Take 1 tablet (50 mg total) by mouth every 6 (six) hours as needed. Qty: 20 tablet, Refills: 0      CONTINUE these medications which have NOT CHANGED   Details  albuterol (PROVENTIL HFA;VENTOLIN HFA) 108 (90 BASE) MCG/ACT inhaler Inhale 2 puffs into the lungs every 6 (six) hours as needed for wheezing or shortness of breath.    ibuprofen (ADVIL,MOTRIN) 200 MG tablet Take 400 mg by mouth every 6 (six) hours as needed for headache or moderate pain.    ALPRAZolam (XANAX) 1 MG tablet Take 1 mg by mouth 3 (three) times daily as needed for anxiety.       STOP taking these medications     ondansetron (ZOFRAN) 4 MG tablet      oxyCODONE-acetaminophen (PERCOCET) 5-325 MG per tablet          Brief H and P: For complete details please refer to admission H and P, but in brief Pamela Bailey is a 35 y.o. female with a history of depression, asthma, chronic headache, tobacco abuse, cleft palate with surgery, longstanding right facial weakness, presenting to the ED with LLE weakness and B arm weakness with numbness  for about one month, amelliorated with hot baths. Last episode at 10 pm prior to presentation. She had been seen by a PCP a month prior, which per chart, an MRI was performed with negative results (no information of theat imaging study is available for review. ) She was  referred to to Neurology evaluation as outpatient.    Hospital Course:  Left sided weakness and numbness, unlikely stroke, unclear etiology at this time for neurology -  Suspected radicular ?S1. -  Patient underwent MRI of the C-spine, thoracic spine, lumbar spine to rule out any epidural abscess or discitis or any other infectious process, MRIs were completely negative -Leukocytosis likely due to stress to margination, improving. Chest x-ray showed bronchitis with emphysema but no significant pneumonia. - B12 level normal. ESR normal. Neurology was consulted, did not  recommend stroke workup. MRI of the brain was negative for acute stroke - Lyme's PCR, hepatitis B, HIV results pending. Per neurology, cleared to be discharged home. I have arranged outpatient neurology follow-up appointment for the patient with Dr. Delice Lesch, recommend nerve conduction EMG study as an outpatient. - Patient was seen by PT and speech therapy, back to baseline. - Patient also underwent 2-D echo which showed EF of 60-65%, no regional motion abnormalities.   Hypokalemia  EKG SR,Tn less than 0.03K3.2 on the admission - Resolved  Hypotension: - Improved, BP 107/69.   Acute bronchitis  - Leukocytosis improving, chest x-ray did not show acute pneumonia. Chest x-ray showed emphysematous bronchitis changes. Patient placed on Levaquin.   afebrile.   Day of Discharge BP 107/69 (BP Location: Left Arm)   Pulse 82   Temp 97.9 F (36.6 C) (Oral)   Resp 18   Ht 5' 2"  (1.575 m)   Wt 41.5 kg (91 lb 7.9 oz)   LMP 06/03/2014   SpO2 98%   BMI 16.73 kg/m   Physical Exam: General: Alert and awake oriented x3 not in any acute distress. HEENT: anicteric sclera, pupils reactive to light and accommodation CVS: S1-S2 clear no murmur rubs or gallops Chest: clear to auscultation bilaterally, no wheezing rales or rhonchi Abdomen: soft nontender, nondistended, normal bowel sounds Extremities: no cyanosis, clubbing or edema noted bilaterally Neuro: Cranial nerves II-XII intact, no focal neurological deficits   The results of significant diagnostics from this hospitalization (including imaging, microbiology, ancillary and laboratory) are listed below for reference.    LAB RESULTS: Basic Metabolic Panel:  Recent Labs Lab 06/20/16 2311 06/20/16 2320 06/22/16 0826  NA 140 141 136  K 3.3* 3.2* 3.8  CL 107 103 105  CO2 25  --  26  GLUCOSE 146* 143* 119*  BUN 12 14 15   CREATININE 0.70 0.60 0.67  CALCIUM 9.7  --  9.2  MG  --   --  2.1   Liver Function Tests:  Recent Labs Lab  06/20/16 2311  AST 21  ALT 14  ALKPHOS 82  BILITOT 0.3  PROT 6.5  ALBUMIN 4.0   No results for input(s): LIPASE, AMYLASE in the last 168 hours. No results for input(s): AMMONIA in the last 168 hours. CBC:  Recent Labs Lab 06/20/16 2311 06/20/16 2320 06/22/16 0826  WBC 21.6*  --  16.1*  NEUTROABS 16.8*  --   --   HGB 14.0 14.6 14.0  HCT 41.5 43.0 42.8  MCV 95.6  --  96.8  PLT 244  --  242   Cardiac Enzymes:  Recent Labs Lab 06/21/16 0943  TROPONINI <0.03   BNP: Invalid input(s): POCBNP CBG:  Recent Labs Lab 06/20/16 2311  GLUCAP 156*    Significant Diagnostic Studies:  Mr Brain Wo Contrast  Result Date: 06/21/2016 CLINICAL DATA:  Anxiety, asthma, facial palsy. Left lower extremity weakness. Bilateral upper extremity weakness. EXAM: MRI HEAD WITHOUT CONTRAST TECHNIQUE: Multiplanar, multiecho pulse  sequences of the brain and surrounding structures were obtained without intravenous contrast. COMPARISON:  Head CT 06/20/2016 FINDINGS: Brain: No acute infarct or intraparenchymal hemorrhage. The midline structures are normal. There is a few scattered foci of hyperintense T2-weighted signal within the periventricular white matter, which are non specific. While these may be seen in the setting of migraine headaches, there also seen in normal patients of this age. No mass lesion or midline shift. No hydrocephalus or extra-axial fluid collection. Vascular: Major intracranial flow voids are preserved. No evidence of chronic microhemorrhage or amyloid angiopathy. Skull and upper cervical spine: The visualized skull base, calvarium, upper cervical spine and extracranial soft tissues are normal. Sinuses/Orbits: Moderate right maxillary sinus mucosal thickening. Normal orbits. IMPRESSION: Normal MRI of the brain. Electronically Signed   By: Ulyses Jarred M.D.   On: 06/21/2016 21:09   Mr Cervical Spine Wo Contrast  Result Date: 06/21/2016 CLINICAL DATA:  Facial palsy, left lower  extremity weakness, bilateral upper extremity weakness. EXAM: MRI CERVICAL AND THORACIC SPINE WITHOUT CONTRAST TECHNIQUE: Spine, to include the craniocervical junction and cervical and thoracic spine, were obtained without intravenous contrast. COMPARISON:  None. FINDINGS: Despite efforts by the technologist and patient, motion artifact is present on today's examination and could not be eliminated. This reduces the sensitivity and specificity of the study. MRI CERVICAL SPINE FINDINGS Alignment: There is reversal of the normal cervical lordosis. No static subluxation. Vertebrae: No fracture, evidence of discitis, or bone lesion. Cord: Normal signal and morphology. Posterior Fossa, vertebral arteries, paraspinal tissues: Negative. Disc levels: C1-C2: Normal. C2-C3: Normal disc space and facet joints. No spinal canal stenosis. No neuroforaminal stenosis. C3-C4: Normal disc space and facet joints. No spinal canal stenosis. No neuroforaminal stenosis. C4-C5: There is a small annular fissure at the posterior central disc minimal central protrusion. No spinal canal stenosis. No neuroforaminal stenosis. C5-C6: Normal disc space and facet joints. No spinal canal stenosis. No neuroforaminal stenosis. C6-C7: Normal disc space and facet joints. No spinal canal stenosis. No neuroforaminal stenosis. C7-T1: Normal disc space and facet joints. No spinal canal stenosis. No neuroforaminal stenosis. MRI THORACIC SPINE FINDINGS Alignment:  There is mild exaggeration of the thoracic kyphosis. Vertebrae: No fracture, evidence of discitis, or bone lesion. Cord:  Normal signal and morphology. Paraspinal and other soft tissues: Negative. Disc levels: There is a central disc protrusion at the T10-T11 level that effaces the right ventral thecal sac and mildly indents the spinal cord. No other significant disc disease, spinal canal stenosis or neural foraminal narrowing. IMPRESSION: 1. No spinal cord lesion or cord compression. 2. No cervical  spinal canal or neural foraminal stenosis. 3. Central disc protrusion at T10-T11, indenting the ventral spinal cord, with no greater than mild spinal canal narrowing. Electronically Signed   By: Ulyses Jarred M.D.   On: 06/21/2016 21:20   Mr Cervical Spine Wo Contrast  Result Date: 06/21/2016 CLINICAL DATA:  35 y/o F; history of left leg weakness and numbness as well as bilateral arm weakness and numbness for the past month. EXAM: MRI CERVICAL AND LUMBAR SPINE WITHOUT CONTRAST TECHNIQUE: Sagittal T1, sagittal inversion recovery, and sagittal T2 weighted sequences of the lumbar spine were acquired. Sagittal T2, sagittal T1, sagittal inversion recovery, axial T2 MERGE sequences of the cervical spine were acquired. The patient was unable to continue the exam. COMPARISON:  01/21/2015 lumbar MRI. 12/19/2015 CT of abdomen and pelvis. FINDINGS: MRI CERVICAL SPINE FINDINGS Moderate motion degradation of all sequences. Alignment: Straightening of cervical lordosis with slight reversal at  C4-5. Vertebrae: No fracture, evidence of discitis, or bone lesion. Cord: No definite abnormal cord signal on the sagittal T2 weighted sequence, artifact from C4 through C7. Other sequences are motion degraded. Posterior Fossa, vertebral arteries, paraspinal tissues: Negative. Disc levels: There are small disc bulges at the C3-4 and C4-5 level. At C4-5 there is an annular fissure. There is no significant canal stenosis or cord compression. MRI LUMBAR SPINE FINDINGS Moderate motion degradation of all sequences. Segmentation:  Standard. Alignment:  Physiologic. Vertebrae:  No loss of vertebral body height. Conus medullaris: The conus extends to approximately the T12-L1 level. No definite abnormal cord signal. Paraspinal and other soft tissues: No T1 signal abnormality. Disc levels: No significant disc displacement, foraminal narrowing, or canal stenosis. IMPRESSION: Moderate motion degradation of all sequences. Consider repeat imaging if  clinically needed, possibly with greater sedation or anesthesia, when patient is able. 1. No definite abnormal cervical cord signal. Artifact from C4 through C7. 2. Small disc bulges at the C3 through C5 levels and C4-5 annular fissure. No significant canal stenosis. No cord compression. 3. No significant disc displacement, foraminal narrowing, or canal stenosis of the lumbar spine. Electronically Signed   By: Kristine Garbe M.D.   On: 06/21/2016 05:20   Mr Thoracic Spine Wo Contrast  Result Date: 06/21/2016 CLINICAL DATA:  Facial palsy, left lower extremity weakness, bilateral upper extremity weakness. EXAM: MRI CERVICAL AND THORACIC SPINE WITHOUT CONTRAST TECHNIQUE: Spine, to include the craniocervical junction and cervical and thoracic spine, were obtained without intravenous contrast. COMPARISON:  None. FINDINGS: Despite efforts by the technologist and patient, motion artifact is present on today's examination and could not be eliminated. This reduces the sensitivity and specificity of the study. MRI CERVICAL SPINE FINDINGS Alignment: There is reversal of the normal cervical lordosis. No static subluxation. Vertebrae: No fracture, evidence of discitis, or bone lesion. Cord: Normal signal and morphology. Posterior Fossa, vertebral arteries, paraspinal tissues: Negative. Disc levels: C1-C2: Normal. C2-C3: Normal disc space and facet joints. No spinal canal stenosis. No neuroforaminal stenosis. C3-C4: Normal disc space and facet joints. No spinal canal stenosis. No neuroforaminal stenosis. C4-C5: There is a small annular fissure at the posterior central disc minimal central protrusion. No spinal canal stenosis. No neuroforaminal stenosis. C5-C6: Normal disc space and facet joints. No spinal canal stenosis. No neuroforaminal stenosis. C6-C7: Normal disc space and facet joints. No spinal canal stenosis. No neuroforaminal stenosis. C7-T1: Normal disc space and facet joints. No spinal canal stenosis. No  neuroforaminal stenosis. MRI THORACIC SPINE FINDINGS Alignment:  There is mild exaggeration of the thoracic kyphosis. Vertebrae: No fracture, evidence of discitis, or bone lesion. Cord:  Normal signal and morphology. Paraspinal and other soft tissues: Negative. Disc levels: There is a central disc protrusion at the T10-T11 level that effaces the right ventral thecal sac and mildly indents the spinal cord. No other significant disc disease, spinal canal stenosis or neural foraminal narrowing. IMPRESSION: 1. No spinal cord lesion or cord compression. 2. No cervical spinal canal or neural foraminal stenosis. 3. Central disc protrusion at T10-T11, indenting the ventral spinal cord, with no greater than mild spinal canal narrowing. Electronically Signed   By: Ulyses Jarred M.D.   On: 06/21/2016 21:20   Mr Lumbar Spine Wo Contrast  Result Date: 06/21/2016 CLINICAL DATA:  35 y/o F; history of left leg weakness and numbness as well as bilateral arm weakness and numbness for the past month. EXAM: MRI CERVICAL AND LUMBAR SPINE WITHOUT CONTRAST TECHNIQUE: Sagittal T1, sagittal inversion recovery,  and sagittal T2 weighted sequences of the lumbar spine were acquired. Sagittal T2, sagittal T1, sagittal inversion recovery, axial T2 MERGE sequences of the cervical spine were acquired. The patient was unable to continue the exam. COMPARISON:  01/21/2015 lumbar MRI. 12/19/2015 CT of abdomen and pelvis. FINDINGS: MRI CERVICAL SPINE FINDINGS Moderate motion degradation of all sequences. Alignment: Straightening of cervical lordosis with slight reversal at C4-5. Vertebrae: No fracture, evidence of discitis, or bone lesion. Cord: No definite abnormal cord signal on the sagittal T2 weighted sequence, artifact from C4 through C7. Other sequences are motion degraded. Posterior Fossa, vertebral arteries, paraspinal tissues: Negative. Disc levels: There are small disc bulges at the C3-4 and C4-5 level. At C4-5 there is an annular fissure.  There is no significant canal stenosis or cord compression. MRI LUMBAR SPINE FINDINGS Moderate motion degradation of all sequences. Segmentation:  Standard. Alignment:  Physiologic. Vertebrae:  No loss of vertebral body height. Conus medullaris: The conus extends to approximately the T12-L1 level. No definite abnormal cord signal. Paraspinal and other soft tissues: No T1 signal abnormality. Disc levels: No significant disc displacement, foraminal narrowing, or canal stenosis. IMPRESSION: Moderate motion degradation of all sequences. Consider repeat imaging if clinically needed, possibly with greater sedation or anesthesia, when patient is able. 1. No definite abnormal cervical cord signal. Artifact from C4 through C7. 2. Small disc bulges at the C3 through C5 levels and C4-5 annular fissure. No significant canal stenosis. No cord compression. 3. No significant disc displacement, foraminal narrowing, or canal stenosis of the lumbar spine. Electronically Signed   By: Kristine Garbe M.D.   On: 06/21/2016 05:20   Ct Head Code Stroke W/o Cm  Result Date: 06/20/2016 CLINICAL DATA:  Code stroke.  Left-sided deficits. EXAM: CT HEAD WITHOUT CONTRAST TECHNIQUE: Contiguous axial images were obtained from the base of the skull through the vertex without intravenous contrast. COMPARISON:  09/04/2008 CT head. FINDINGS: Brain: There is a band of streak artifact partially obscuring the anterior temporal lobes an cerebellum. No evidence of acute infarction, hemorrhage, hydrocephalus, extra-axial collection or mass lesion/mass effect. Vascular: No hyperdense vessel or unexpected calcification. Skull: Normal. Negative for fracture or focal lesion. Sinuses/Orbits: Partially visualized postsurgical changes within the nasopharynx. Partial opacification of right maxillary sinus with thickening of the walls compatible with chronic inflammatory changes and mucosal thickening of anterior ethmoid air cells. Underpneumatized and  sclerotic left mastoid air cells compatible sequelae of chronic otomastoiditis. Other: None. ASPECTS [facility] Stroke Program Early CT Score) - Ganglionic level infarction (caudate, lentiform nuclei, internal capsule, insula, M1-M3 cortex): 7 - Supraganglionic infarction (M4-M6 cortex): 3 Total score (0-10 with 10 being normal): 10 IMPRESSION: 1. Band of streak artifact partially obscures anterior temporal lobes and cerebellum. No acute process identified. 2. ASPECTS is 10 3. Paranasal sinus disease. These results were called by telephone at the time of interpretation on 06/20/2016 at 11:28 pm to Dr. Leonel Ramsay who verbally acknowledged these results. Electronically Signed   By: Kristine Garbe M.D.   On: 06/20/2016 23:28    2D ECHO:   Disposition and Follow-up: Discharge Instructions    Diet - low sodium heart healthy    Complete by:  As directed    Increase activity slowly    Complete by:  As directed        DISPOSITION: [place], MD Follow up on 08/25/2016.   Specialty:  Neurology Why:  at 9:00AM  Contact information: [address] [place] [address] [phone]        FULBRIGHT, VIRGINIA E, PA-C. Schedule an appointment as soon as possible for a visit in 2 week(s).   Specialty:  Family Medicine Why:  For hospital follow-up Contact information: [address] [place] [address] [phone]            Time spent on Discharge: 76mns   Signed:   Rilea Arutyunyan M.D. [facility] 06/22/2016, 12:09 PM Pager: [phone]  Coding query BMI 16.8 underweight with mod-severe protein calorie malnutrition   Mili Piltz M.D. [facility] 07/19/2016, 7:58 AM  Pager: [phone] Coding query: BMI 16.8 underweight with severe protein calorie malnutrition    Jamaurie Bernier M.D. [facility] 07/25/2016, 2:57 AM  Pager: [phone]

## 2016-06-22 NOTE — Progress Notes (Addendum)
Neurology follow up note Subjective: Left leg weakness and numbness is completely resolved   Objective: Current vital signs: BP 99/64 (BP Location: Left Arm)   Pulse 78   Temp 97.7 F (36.5 C) (Oral)   Resp 16   Ht 5\' 2"  (1.575 m)   Wt 41.5 kg (91 lb 7.9 oz)   LMP 06/03/2014   SpO2 96%   BMI 16.73 kg/m  Vital signs in last 24 hours: Temp:  [97.7 F (36.5 C)-98 F (36.7 C)] 97.7 F (36.5 C) (10/03 0459) Pulse Rate:  [66-86] 78 (10/03 0459) Resp:  [16] 16 (10/03 0459) BP: (97-107)/(51-76) 99/64 (10/03 0459) SpO2:  [95 %-98 %] 96 % (10/03 0459)  Intake/Output from previous day: 10/02 0701 - 10/03 0700 In: 850 [P.O.:120; I.V.:730] Out: 200 [Urine:200] Intake/Output this shift: No intake/output data recorded. Nutritional status: Diet regular Room service appropriate? Yes; Fluid consistency: Thin  Neurologic Exam: General: NAD Mental Status: Alert, oriented, thought content appropriate.  Speech fluent without evidence of aphasia.  Able to follow 3 step commands without difficulty. Cranial Nerves: II:  Visual fields grossly normal, pupils equal, round, reactive to light and accommodation III,IV, VI: ptosis not present, extra-ocular motions intact bilaterally V,VII: smile symmetric, facial light touch sensation normal bilaterally VIII: hearing normal bilaterally IX,X: uvula rises symmetrically XI: bilateral shoulder shrug XII: midline tongue extension without atrophy or fasciculations  Motor: Right : Upper extremity   5/5    Left:     Upper extremity   5/5  Lower extremity   5/5     Lower extremity   5/5 Tone and bulk:normal tone throughout; no atrophy noted Sensory: Pinprick and light touch intact throughout, bilaterally Deep Tendon Reflexes:  Right: Upper Extremity   Left: Upper extremity   biceps (C-5 to C-6) 2/4   biceps (C-5 to C-6) 2/4 tricep (C7) 2/4    triceps (C7) 2/4 Brachioradialis (C6) 2/4  Brachioradialis (C6) 2/4  Lower Extremity Lower Extremity   quadriceps (L-2 to L-4) 2/4   quadriceps (L-2 to L-4) 2/4 Achilles (S1) 2/4   Achilles (S1) 2/4  Plantars: Right: downgoing   Left: downgoing Cerebellar: normal finger-to-nose,  normal heel-to-shin test Gait: normal  Lab Results: Basic Metabolic Panel:  Recent Labs Lab 06/20/16 2311 06/20/16 2320 06/22/16 0826  NA 140 141 136  K 3.3* 3.2* 3.8  CL 107 103 105  CO2 25  --  26  GLUCOSE 146* 143* 119*  BUN 12 14 15   CREATININE 0.70 0.60 0.67  CALCIUM 9.7  --  9.2  MG  --   --  2.1    Liver Function Tests:  Recent Labs Lab 06/20/16 2311  AST 21  ALT 14  ALKPHOS 82  BILITOT 0.3  PROT 6.5  ALBUMIN 4.0   No results for input(s): LIPASE, AMYLASE in the last 168 hours. No results for input(s): AMMONIA in the last 168 hours.  CBC:  Recent Labs Lab 06/20/16 2311 06/20/16 2320 06/22/16 0826  WBC 21.6*  --  16.1*  NEUTROABS 16.8*  --   --   HGB 14.0 14.6 14.0  HCT 41.5 43.0 42.8  MCV 95.6  --  96.8  PLT 244  --  242    Cardiac Enzymes:  Recent Labs Lab 06/21/16 0943  TROPONINI <0.03    Lipid Panel:  Recent Labs Lab 06/22/16 0215  CHOL 178  TRIG 127  HDL 33*  CHOLHDL 5.4  VLDL 25  LDLCALC 161120*    CBG:  Recent Labs Lab  06/20/16 2311  GLUCAP 156*    Microbiology: Results for orders placed or performed during the hospital encounter of 08/25/13  GC/Chlamydia Probe Amp     Status: None   Collection Time: 08/25/13  6:47 PM  Result Value Ref Range Status   CT Probe RNA NEGATIVE NEGATIVE Final   GC Probe RNA NEGATIVE NEGATIVE Final    Comment: (NOTE)                                                                                       **Normal Reference Range: Negative**      Assay performed using the Gen-Probe APTIMA COMBO2 (R) Assay. Acceptable specimen types for this assay include APTIMA Swabs (Unisex, endocervical, urethral, or vaginal), first void urine, and ThinPrep liquid based cytology samples. Performed at [facility] prep, genital     Status: None   Collection Time: 08/25/13  6:47 PM  Result Value Ref Range Status   Yeast Wet Prep HPF POC NONE SEEN NONE SEEN Final   Trich, Wet Prep NONE SEEN NONE SEEN Final   Clue Cells Wet Prep HPF POC NONE SEEN NONE SEEN Final   WBC, Wet Prep HPF POC NONE SEEN NONE SEEN Final    Coagulation Studies:  Recent Labs  06/20/16 2311 06/21/16 0943  LABPROT 13.5 13.9  INR 1.02 1.06    Imaging: Mr Brain Wo Contrast  Result Date: 06/21/2016 CLINICAL DATA:  Anxiety, asthma, facial palsy. Left lower extremity weakness. Bilateral upper extremity weakness. EXAM: MRI HEAD WITHOUT CONTRAST TECHNIQUE: Multiplanar, multiecho pulse sequences of the brain and surrounding structures were obtained without intravenous contrast. COMPARISON:  Head CT 06/20/2016 FINDINGS: Brain: No acute infarct or intraparenchymal hemorrhage. The midline structures are normal. There is a few scattered foci of hyperintense T2-weighted signal within the periventricular white matter, which are non specific. While these may be seen in the setting of migraine headaches, there also seen in normal patients of this age. No mass lesion or midline shift. No hydrocephalus or extra-axial fluid collection. Vascular: Major intracranial flow voids are preserved. No evidence of chronic microhemorrhage or amyloid angiopathy. Skull and upper cervical spine: The visualized skull base, calvarium, upper cervical spine and extracranial soft tissues are normal. Sinuses/Orbits: Moderate right maxillary sinus mucosal thickening. Normal orbits. IMPRESSION: Normal MRI of the brain. Electronically Signed   By: Deatra Robinson M.D.   On: 06/21/2016 21:09   Mr Cervical Spine Wo Contrast  Result Date: 06/21/2016 CLINICAL DATA:  Facial palsy, left lower extremity weakness, bilateral upper extremity weakness. EXAM: MRI CERVICAL AND THORACIC SPINE WITHOUT CONTRAST TECHNIQUE: Spine, to include the craniocervical junction and cervical and  thoracic spine, were obtained without intravenous contrast. COMPARISON:  None. FINDINGS: Despite efforts by the technologist and patient, motion artifact is present on today's examination and could not be eliminated. This reduces the sensitivity and specificity of the study. MRI CERVICAL SPINE FINDINGS Alignment: There is reversal of the normal cervical lordosis. No static subluxation. Vertebrae: No fracture, evidence of discitis, or bone lesion. Cord: Normal signal and morphology. Posterior Fossa, vertebral arteries, paraspinal tissues: Negative. Disc levels: C1-C2: Normal. C2-C3: Normal disc space and facet  joints. No spinal canal stenosis. No neuroforaminal stenosis. C3-C4: Normal disc space and facet joints. No spinal canal stenosis. No neuroforaminal stenosis. C4-C5: There is a small annular fissure at the posterior central disc minimal central protrusion. No spinal canal stenosis. No neuroforaminal stenosis. C5-C6: Normal disc space and facet joints. No spinal canal stenosis. No neuroforaminal stenosis. C6-C7: Normal disc space and facet joints. No spinal canal stenosis. No neuroforaminal stenosis. C7-T1: Normal disc space and facet joints. No spinal canal stenosis. No neuroforaminal stenosis. MRI THORACIC SPINE FINDINGS Alignment:  There is mild exaggeration of the thoracic kyphosis. Vertebrae: No fracture, evidence of discitis, or bone lesion. Cord:  Normal signal and morphology. Paraspinal and other soft tissues: Negative. Disc levels: There is a central disc protrusion at the T10-T11 level that effaces the right ventral thecal sac and mildly indents the spinal cord. No other significant disc disease, spinal canal stenosis or neural foraminal narrowing. IMPRESSION: 1. No spinal cord lesion or cord compression. 2. No cervical spinal canal or neural foraminal stenosis. 3. Central disc protrusion at T10-T11, indenting the ventral spinal cord, with no greater than mild spinal canal narrowing. Electronically  Signed   By: Deatra Robinson M.D.   On: 06/21/2016 21:20   Mr Cervical Spine Wo Contrast  Result Date: 06/21/2016 CLINICAL DATA:  35 y/o F; history of left leg weakness and numbness as well as bilateral arm weakness and numbness for the past month. EXAM: MRI CERVICAL AND LUMBAR SPINE WITHOUT CONTRAST TECHNIQUE: Sagittal T1, sagittal inversion recovery, and sagittal T2 weighted sequences of the lumbar spine were acquired. Sagittal T2, sagittal T1, sagittal inversion recovery, axial T2 MERGE sequences of the cervical spine were acquired. The patient was unable to continue the exam. COMPARISON:  01/21/2015 lumbar MRI. 12/19/2015 CT of abdomen and pelvis. FINDINGS: MRI CERVICAL SPINE FINDINGS Moderate motion degradation of all sequences. Alignment: Straightening of cervical lordosis with slight reversal at C4-5. Vertebrae: No fracture, evidence of discitis, or bone lesion. Cord: No definite abnormal cord signal on the sagittal T2 weighted sequence, artifact from C4 through C7. Other sequences are motion degraded. Posterior Fossa, vertebral arteries, paraspinal tissues: Negative. Disc levels: There are small disc bulges at the C3-4 and C4-5 level. At C4-5 there is an annular fissure. There is no significant canal stenosis or cord compression. MRI LUMBAR SPINE FINDINGS Moderate motion degradation of all sequences. Segmentation:  Standard. Alignment:  Physiologic. Vertebrae:  No loss of vertebral body height. Conus medullaris: The conus extends to approximately the T12-L1 level. No definite abnormal cord signal. Paraspinal and other soft tissues: No T1 signal abnormality. Disc levels: No significant disc displacement, foraminal narrowing, or canal stenosis. IMPRESSION: Moderate motion degradation of all sequences. Consider repeat imaging if clinically needed, possibly with greater sedation or anesthesia, when patient is able. 1. No definite abnormal cervical cord signal. Artifact from C4 through C7. 2. Small disc bulges  at the C3 through C5 levels and C4-5 annular fissure. No significant canal stenosis. No cord compression. 3. No significant disc displacement, foraminal narrowing, or canal stenosis of the lumbar spine. Electronically Signed   By: Mitzi Hansen M.D.   On: 06/21/2016 05:20   Mr Thoracic Spine Wo Contrast  Result Date: 06/21/2016 CLINICAL DATA:  Facial palsy, left lower extremity weakness, bilateral upper extremity weakness. EXAM: MRI CERVICAL AND THORACIC SPINE WITHOUT CONTRAST TECHNIQUE: Spine, to include the craniocervical junction and cervical and thoracic spine, were obtained without intravenous contrast. COMPARISON:  None. FINDINGS: Despite efforts by the technologist and patient, motion  artifact is present on today's examination and could not be eliminated. This reduces the sensitivity and specificity of the study. MRI CERVICAL SPINE FINDINGS Alignment: There is reversal of the normal cervical lordosis. No static subluxation. Vertebrae: No fracture, evidence of discitis, or bone lesion. Cord: Normal signal and morphology. Posterior Fossa, vertebral arteries, paraspinal tissues: Negative. Disc levels: C1-C2: Normal. C2-C3: Normal disc space and facet joints. No spinal canal stenosis. No neuroforaminal stenosis. C3-C4: Normal disc space and facet joints. No spinal canal stenosis. No neuroforaminal stenosis. C4-C5: There is a small annular fissure at the posterior central disc minimal central protrusion. No spinal canal stenosis. No neuroforaminal stenosis. C5-C6: Normal disc space and facet joints. No spinal canal stenosis. No neuroforaminal stenosis. C6-C7: Normal disc space and facet joints. No spinal canal stenosis. No neuroforaminal stenosis. C7-T1: Normal disc space and facet joints. No spinal canal stenosis. No neuroforaminal stenosis. MRI THORACIC SPINE FINDINGS Alignment:  There is mild exaggeration of the thoracic kyphosis. Vertebrae: No fracture, evidence of discitis, or bone lesion.  Cord:  Normal signal and morphology. Paraspinal and other soft tissues: Negative. Disc levels: There is a central disc protrusion at the T10-T11 level that effaces the right ventral thecal sac and mildly indents the spinal cord. No other significant disc disease, spinal canal stenosis or neural foraminal narrowing. IMPRESSION: 1. No spinal cord lesion or cord compression. 2. No cervical spinal canal or neural foraminal stenosis. 3. Central disc protrusion at T10-T11, indenting the ventral spinal cord, with no greater than mild spinal canal narrowing. Electronically Signed   By: Deatra Robinson M.D.   On: 06/21/2016 21:20   Mr Lumbar Spine Wo Contrast  Result Date: 06/21/2016 CLINICAL DATA:  35 y/o F; history of left leg weakness and numbness as well as bilateral arm weakness and numbness for the past month. EXAM: MRI CERVICAL AND LUMBAR SPINE WITHOUT CONTRAST TECHNIQUE: Sagittal T1, sagittal inversion recovery, and sagittal T2 weighted sequences of the lumbar spine were acquired. Sagittal T2, sagittal T1, sagittal inversion recovery, axial T2 MERGE sequences of the cervical spine were acquired. The patient was unable to continue the exam. COMPARISON:  01/21/2015 lumbar MRI. 12/19/2015 CT of abdomen and pelvis. FINDINGS: MRI CERVICAL SPINE FINDINGS Moderate motion degradation of all sequences. Alignment: Straightening of cervical lordosis with slight reversal at C4-5. Vertebrae: No fracture, evidence of discitis, or bone lesion. Cord: No definite abnormal cord signal on the sagittal T2 weighted sequence, artifact from C4 through C7. Other sequences are motion degraded. Posterior Fossa, vertebral arteries, paraspinal tissues: Negative. Disc levels: There are small disc bulges at the C3-4 and C4-5 level. At C4-5 there is an annular fissure. There is no significant canal stenosis or cord compression. MRI LUMBAR SPINE FINDINGS Moderate motion degradation of all sequences. Segmentation:  Standard. Alignment:   Physiologic. Vertebrae:  No loss of vertebral body height. Conus medullaris: The conus extends to approximately the T12-L1 level. No definite abnormal cord signal. Paraspinal and other soft tissues: No T1 signal abnormality. Disc levels: No significant disc displacement, foraminal narrowing, or canal stenosis. IMPRESSION: Moderate motion degradation of all sequences. Consider repeat imaging if clinically needed, possibly with greater sedation or anesthesia, when patient is able. 1. No definite abnormal cervical cord signal. Artifact from C4 through C7. 2. Small disc bulges at the C3 through C5 levels and C4-5 annular fissure. No significant canal stenosis. No cord compression. 3. No significant disc displacement, foraminal narrowing, or canal stenosis of the lumbar spine. Electronically Signed   By: Mitzi Hansen  M.D.   On: 06/21/2016 05:20   Ct Head Code Stroke W/o Cm  Result Date: 06/20/2016 CLINICAL DATA:  Code stroke.  Left-sided deficits. EXAM: CT HEAD WITHOUT CONTRAST TECHNIQUE: Contiguous axial images were obtained from the base of the skull through the vertex without intravenous contrast. COMPARISON:  09/04/2008 CT head. FINDINGS: Brain: There is a band of streak artifact partially obscuring the anterior temporal lobes an cerebellum. No evidence of acute infarction, hemorrhage, hydrocephalus, extra-axial collection or mass lesion/mass effect. Vascular: No hyperdense vessel or unexpected calcification. Skull: Normal. Negative for fracture or focal lesion. Sinuses/Orbits: Partially visualized postsurgical changes within the nasopharynx. Partial opacification of right maxillary sinus with thickening of the walls compatible with chronic inflammatory changes and mucosal thickening of anterior ethmoid air cells. Underpneumatized and sclerotic left mastoid air cells compatible sequelae of chronic otomastoiditis. Other: None. ASPECTS [facility] Stroke Program Early CT Score) - Ganglionic level  infarction (caudate, lentiform nuclei, internal capsule, insula, M1-M3 cortex): 7 - Supraganglionic infarction (M4-M6 cortex): 3 Total score (0-10 with 10 being normal): 10 IMPRESSION: 1. Band of streak artifact partially obscures anterior temporal lobes and cerebellum. No acute process identified. 2. ASPECTS is 10 3. Paranasal sinus disease. These results were called by telephone at the time of interpretation on 06/20/2016 at 11:28 pm to Dr. Amada Jupiter who verbally acknowledged these results. Electronically Signed   By: Mitzi Hansen M.D.   On: 06/20/2016 23:28    Medications:  Scheduled: . heparin  5,000 Units Subcutaneous Q8H    Assessment/Plan:  Left leg weakness and numbness completely resolved. Patient complains of intermittent symptoms. Exact etiology unknown. She had a detailed neurological workup including MRI brain CT and L-spine which does not explain patient's symptoms and no sign of spinal stenosis, epidural abscess or tumor. Findings were discussed in detail the patient. We will order vitamin B12, Lyme disease, hepatitis B and HIV Recommend outpatient neurology follow-up consider nerve conduction EMG study as an outpatient. Okay to be discharged home   06/22/2016, 9:28 AM

## 2016-06-23 LAB — HEMOGLOBIN A1C
Hgb A1c MFr Bld: 5.3 % (ref 4.8–5.6)
Mean Plasma Glucose: 105 mg/dL

## 2016-06-23 LAB — HEPATITIS B E ANTIGEN: Hep B E Ag: NEGATIVE

## 2016-06-23 LAB — B. BURGDORFI ANTIBODIES

## 2016-06-23 LAB — HEPATITIS C ANTIBODY

## 2016-06-23 NOTE — Care Management Note (Signed)
Case Management Note  Patient Details  Name: Pamela Bailey MRN: [id number] Date of Birth: 08-22-1981  Subjective/Objective:                    Action/Plan: Pt discharged home with self care. Orders for rolling walker. CM met with the patient and she does need this equipment. Jermaine with [facility] DME delivered it to the room. Bedside RN was updated.   Expected Discharge Date:                  Expected Discharge Plan:  Home/Self Care  In-House Referral:     Discharge planning Services  CM Consult  Post Acute Care Choice:  Durable Medical Equipment Choice offered to:  Patient  DME Arranged:  Walker rolling DME Agency:  [place]:    [place]:     Status of Service:  Completed, signed off  If discussed at [place] of Stay Meetings, dates discussed:    Additional Comments:  Pollie Friar, RN 06/23/2016, 8:58 AM

## 2016-06-26 LAB — CULTURE, BLOOD (ROUTINE X 2)
CULTURE: NO GROWTH
CULTURE: NO GROWTH
CULTURE: NO GROWTH
Culture: NO GROWTH

## 2016-07-29 ENCOUNTER — Ambulatory Visit: Payer: Medicaid Other | Admitting: Physical Therapy

## 2016-08-11 ENCOUNTER — Encounter: Payer: Self-pay | Admitting: Neurology

## 2016-08-25 ENCOUNTER — Ambulatory Visit (INDEPENDENT_AMBULATORY_CARE_PROVIDER_SITE_OTHER): Payer: Medicaid Other | Admitting: Neurology

## 2016-08-25 ENCOUNTER — Encounter: Payer: Self-pay | Admitting: Neurology

## 2016-08-25 DIAGNOSIS — R202 Paresthesia of skin: Secondary | ICD-10-CM | POA: Diagnosis not present

## 2016-08-25 DIAGNOSIS — R200 Anesthesia of skin: Secondary | ICD-10-CM | POA: Diagnosis not present

## 2016-08-25 DIAGNOSIS — M542 Cervicalgia: Secondary | ICD-10-CM | POA: Diagnosis not present

## 2016-08-25 DIAGNOSIS — M5440 Lumbago with sciatica, unspecified side: Secondary | ICD-10-CM | POA: Diagnosis not present

## 2016-08-25 NOTE — Patient Instructions (Addendum)
1. Schedule EMG/NCV of the left UE and LE with Dr. Allena KatzPatel 2. Start Gabapentin as prescribed by your counselor 3. Refer to Pain Management for neck and back pain 4. Follow-up in 3 months, call for any changes

## 2016-08-25 NOTE — Progress Notes (Signed)
NEUROLOGY CONSULTATION NOTE  HANLEY RISPOLI MRN: [id number] DOB: [birth year]  Referring provider: Dr. Thad Ranger Primary care provider: Burnett Kanaris, PA-C  Reason for consult:  Leg weakness  Dear Dr Isidoro Donning:  Thank you for your kind referral of Casidee P Thackeray for consultation of the above symptoms. Although her history is well known to you, please allow me to reiterate it for the purpose of our medical record. Records and images were personally reviewed where available.   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 She reports having chronic low back pain with difficulty sitting for prolonged periods, however over the past 6 months, she started having numbness and tingling in her left foot, moving up both legs and both arms, then chest pain. Episodes did not have a stereotypic nature, affecting either extremity, sometimes lasting all day. Sometimes she feels something wrapping around her abdomen. She tried taking hot baths, with no effect. She started having more pain in her lower back wrapping through her buttock region and going down her legs. She went to the ER on 06/20/16 for left leg weakness and bilateral arm weakness and numbness. She had an MRI of the brain, cervical, thoracic, and lumbar spine, which did not show any acute changes. I personally reviewed images, normal MRI brain. Spine imaging was degraded by motion artifact, but overall did not show any acute changes. There was a central disc protrusion at T10-11, indenting the ventral spinal cord, with no greater than mild spinal canal narrowing, no spinal cord lesion or cord compression, no cervical spinal canal or neural foraminal  stenosis, small disc bulges at C3 through C5 levels and C4-5 annular fissure, lumbar spine normal. Left leg weakness improved. As soon as she got out of the hospital, she reports her right toe started getting numb. She also reports problems with her knees, they lock when she flexes them. She had seen Ortho and had xrays, given a knee brace to use. She has a little left-sided neck pain. She denies any recent heavy lifting or falls. She denies any bowel/bladder dysfunction. No family history of neuromuscular disease. She denies any headaches, dizziness, diplopia, dysarthria/dysphagia. For the past 6-7 months, she feels her left eye would twitch. She reports a lot of pain unrelieved with Tramadol and Flexeril, she was given 10 tablets of hydrocodone in the ER, which did help. She reports feeling uncomfortable sitting/standing.   Laboratory Data: Lab Results  Component Value Date   WBC 16.1 (H) 06/22/2016   HGB 14.0 06/22/2016   HCT 42.8 06/22/2016   MCV 96.8 06/22/2016   PLT 242 06/22/2016     Chemistry      Component Value Date/Time   NA 136 06/22/2016 0826   K 3.8 06/22/2016 0826   CL 105 06/22/2016 0826   CO2 26 06/22/2016 0826   BUN 15 06/22/2016 0826   CREATININE 0.67 06/22/2016 0826      Component Value Date/Time   CALCIUM 9.2 06/22/2016 0826   ALKPHOS 82 06/20/2016 2311   AST 21 06/20/2016 2311   ALT 14 06/20/2016 2311   BILITOT 0.3 06/20/2016 2311     Component     Latest Ref Rng & Units 06/21/2016 06/22/2016  Sed Rate     0 -  22 mm/hr 17   CRP     <1.0 mg/dL 2.7 (H)   Magnesium     1.7 - 2.4 mg/dL  2.1  HIV     Non Reactive  Non Reactive  HCV Ab     0.0 - 0.9 s/co ratio  <0.1  B burgdorferi Ab IgG+IgM     0.00 - 0.90 ISR  <0.91  Vitamin B12     180 - 914 pg/mL  390  Hep B E Ag     Negative  Negative    PAST MEDICAL HISTORY: Past Medical History:  Diagnosis Date  . Abnormal Pap smear    colpo 5/09  . Anxiety   . Asthma   . Cleft hard palate   . Cleft lip     . Depression   . Endometriosis   . Facial palsy   . Headache(784.0)   . Ovarian cyst   . PONV (postoperative nausea and vomiting)     PAST SURGICAL HISTORY: Past Surgical History:  Procedure Laterality Date  . ABDOMINAL HYSTERECTOMY    . ABDOMINAL SURGERY    . COSMETIC SURGERY    . LAPAROSCOPY    . NO PAST SURGERIES    . SALPINGECTOMY    . TYMPANOSTOMY TUBE PLACEMENT      MEDICATIONS: Current Outpatient Prescriptions on File Prior to Visit  Medication Sig Dispense Refill  . albuterol (PROVENTIL HFA;VENTOLIN HFA) 108 (90 BASE) MCG/ACT inhaler Inhale 2 puffs into the lungs every 6 (six) hours as needed for wheezing or shortness of breath.    . ALPRAZolam (XANAX) 1 MG tablet Take 1 mg by mouth 3 (three) times daily as needed for anxiety.     Marland Kitchen. ibuprofen (ADVIL,MOTRIN) 200 MG tablet Take 400 mg by mouth every 6 (six) hours as needed for headache or moderate pain.    Marland Kitchen. levofloxacin (LEVAQUIN) 500 MG tablet Take 1 tablet (500 mg total) by mouth daily. X 1 week (Patient not taking: Reported on 08/25/2016) 7 tablet 0  . nicotine (NICODERM CQ - DOSED IN MG/24 HOURS) 21 mg/24hr patch Place 1 patch (21 mg total) onto the skin daily. (Patient not taking: Reported on 08/25/2016) 28 patch 0   No current facility-administered medications on file prior to visit.     ALLERGIES: Allergies  Allergen Reactions  . Azithromycin Other (See Comments)    Caused renal failure  . Vancomycin Other (See Comments)    Acute kidney injury   . Amoxicillin-Pot Clavulanate Nausea And Vomiting, Rash and Other (See Comments)    Has patient had a PCN reaction causing immediate rash, facial/tongue/throat swelling, SOB or lightheadedness with hypotension: No Has patient had a PCN reaction causing severe rash involving mucus membranes or skin necrosis: No Has patient had a PCN reaction that required hospitalization No Has patient had a PCN reaction occurring within the last 10 years: No If all of the above  answers are "NO", then may proceed with Cephalosporin use.     FAMILY HISTORY: Family History  Problem Relation Age of Onset  . Hypertension Maternal Grandfather   . Stroke Father   . Anesthesia problems Neg Hx     SOCIAL HISTORY: Social History   Social History  . Marital status: Single    Spouse name: N/A  . Number of children: N/A  . Years of education: N/A   Occupational History  . Not on file.   Social History Main Topics  . Smoking status: Current Every Day Smoker    Packs/day:  0.50    Years: 8.00    Types: Cigarettes  . Smokeless tobacco: Never Used  . Alcohol use No  . Drug use: No  . Sexual activity: Yes    Birth control/ protection: None   Other Topics Concern  . Not on file   Social History Narrative  . No narrative on file    REVIEW OF SYSTEMS: Constitutional: No fevers, chills, or sweats, no generalized fatigue, change in appetite Eyes: No visual changes, double vision, eye pain Ear, nose and throat: No hearing loss, ear pain, nasal congestion, sore throat Cardiovascular: No chest pain, palpitations Respiratory:  No shortness of breath at rest or with exertion, wheezes GastrointestinaI: No nausea, vomiting, diarrhea, abdominal pain, fecal incontinence Genitourinary:  No dysuria, urinary retention or frequency Musculoskeletal:  No neck pain, back pain Integumentary: No rash, pruritus, skin lesions Neurological: as above Psychiatric: No depression, insomnia, anxiety Endocrine: No palpitations, fatigue, diaphoresis, mood swings, change in appetite, change in weight, increased thirst Hematologic/Lymphatic:  No anemia, purpura, petechiae. Allergic/Immunologic: no itchy/runny eyes, nasal congestion, recent allergic reactions, rashes  PHYSICAL EXAM: Vitals:   08/25/16 0847  BP: 116/72  Pulse: 88   General: No acute distress, s/p cleft lip and palate surgeries with residual right facial weakness Head:  Normocephalic/atraumatic Eyes: Fundoscopic  exam shows bilateral sharp discs, no vessel changes, exudates, or hemorrhages Neck: supple, no paraspinal tenderness, full range of motion Back: No paraspinal tenderness Heart: regular rate and rhythm Lungs: Clear to auscultation bilaterally. Vascular: No carotid bruits. Skin/Extremities: No rash, no edema Neurological Exam: Mental status: alert and oriented to person, place, and time, no dysarthria or aphasia, Fund of knowledge is appropriate.  Recent and remote memory are intact.  Attention and concentration are normal.    Able to name objects and repeat phrases. Cranial nerves: CN I: not tested CN II: pupils equal, round and reactive to light, visual fields intact, fundi unremarkable. CN III, IV, VI:  full range of motion, no nystagmus, no ptosis CN V: facial sensation intact CN VII: weakness of the right frontalis, orbicularis oculi and oris (with Bell's phenomenon on right eye closure) CN VIII: hearing intact to finger rub CN IX, X: gag intact, uvula midline CN XI: sternocleidomastoid and trapezius muscles intact CN XII: tongue midline Bulk & Tone: normal, no fasciculations. Motor: 5/5 throughout with no pronator drift. Sensation: patchy sensory changes, reporting decreased cold on left thigh, decreased pin on left lateral calf, more on the left big toe. Romberg test negative Deep Tendon Reflexes: brisk +3 throughout with +Hoffman sign on the left, no ankle clonus Plantar responses: downgoing bilaterally Cerebellar: no incoordination on finger to nose, heel to shin. No dysdiadochokinesia Gait: narrow-based and steady, able to tandem walk adequately. 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 In the hospital, concern was for radiculopathy.  She had imaging of her entire neuraxis which was essentially unremarkable, there were some disc bulges/protrusion without significant stenosis. The etiology her symptoms is unclear. She was referred for outpatient EMG/NCV, which will be scheduled to further evaluate her symptoms. She is in a lot of neck/back pain and will be referred to Pain Management. We discussed starting gabapentin, she  states that her psychiatrist had actually given her a prescription but she had not started this yet, start as instructed. She will follow-up in 3 months and knows to call for any changes.   Thank you for allowing me to participate in the care of this patient. Please do not hesitate to call for any questions or concerns.   Patrcia DollyKaren Aquino, M.D.  CC: Dr. Isidoro Donningai, Burnett KanarisVirginia Fulbright, PA-C

## 2016-08-26 ENCOUNTER — Ambulatory Visit: Payer: Medicaid Other | Admitting: Neurology

## 2016-09-01 ENCOUNTER — Telehealth: Payer: Self-pay | Admitting: Neurology

## 2016-09-01 NOTE — Telephone Encounter (Signed)
PT called and said she needed the name of the pain management referral/Dawn CB# [phone]

## 2016-09-02 NOTE — Telephone Encounter (Signed)
Contacted patient. She [facility] [phone]. Notified patient we followed up with them yesterday and they state it can take up to 4 weeks to review patient and have her scheduled. Patient states this is okay. She wants to stay with [facility] network due to her insurance.

## 2016-09-09 ENCOUNTER — Encounter: Payer: Self-pay | Admitting: Neurology

## 2016-09-09 ENCOUNTER — Ambulatory Visit (INDEPENDENT_AMBULATORY_CARE_PROVIDER_SITE_OTHER): Payer: Medicaid Other | Admitting: Neurology

## 2016-09-09 VITALS — BP 122/70 | HR 88 | Ht 62.0 in | Wt 87.0 lb

## 2016-09-09 DIAGNOSIS — M545 Low back pain: Secondary | ICD-10-CM | POA: Diagnosis not present

## 2016-09-09 DIAGNOSIS — G8929 Other chronic pain: Secondary | ICD-10-CM | POA: Diagnosis not present

## 2016-09-09 NOTE — Progress Notes (Signed)
Reason for visit: Back pain  Pamela Bailey is an 35 y.o. female  History of present illness:  Pamela Bailey is a 35 year old right-handed white female with a history of chronic low back pain. The patient reports that her back pain has been going on for least 3 years. The patient has had mainly pain in the low back, but more recently she has had discomfort that has gone down the left greater than right leg posteriorly to the knee, not below the knee. The patient may have some tingly sensations into the thigh area as well. The patient has recently been in the hospital around 06/20/2016 with onset of left-sided numbness and weakness. The patient was evaluated for a possible stroke, MRI of the brain was completely normal. The patient has had MRI evaluation of the entire neuro axis including the cervical, thoracic, and lumbar spine. There is no evidence of a significant spinal cord impingement or nerve root impingement at any level. The patient was sent to [facility], she was set up for EMG and nerve conduction study evaluation which has not been done yet. The patient was also set up for a pain center referral. The patient comes to this office for a second opinion. The patient has a history of a cleft palate repair, and a congenital right facial neuropathy.  Past Medical History:  Diagnosis Date  . Abnormal Pap smear    colpo 5/09  . Anxiety   . Asthma   . Cleft hard palate   . Cleft lip   . Depression   . Endometriosis   . Facial palsy   . Headache(784.0)   . Ovarian cyst   . PONV (postoperative nausea and vomiting)     Past Surgical History:  Procedure Laterality Date  . ABDOMINAL HYSTERECTOMY    . ABDOMINAL SURGERY    . COSMETIC SURGERY    . LAPAROSCOPY    . NO PAST SURGERIES    . SALPINGECTOMY    . TYMPANOSTOMY TUBE PLACEMENT      Family History  Problem Relation Age of Onset  . Hypertension Maternal Grandfather   . Stroke Father   . Anesthesia problems Neg Hx      Social history:  reports that she has been smoking Cigarettes.  She has a 4.00 pack-year smoking history. She has never used smokeless tobacco. She reports that she does not drink alcohol or use drugs.    Allergies  Allergen Reactions  . Azithromycin Other (See Comments)    Caused renal failure  . Vancomycin Other (See Comments)    Acute kidney injury   . Amoxicillin-Pot Clavulanate Nausea And Vomiting, Rash and Other (See Comments)    Has patient had a PCN reaction causing immediate rash, facial/tongue/throat swelling, SOB or lightheadedness with hypotension: No Has patient had a PCN reaction causing severe rash involving mucus membranes or skin necrosis: No Has patient had a PCN reaction that required hospitalization No Has patient had a PCN reaction occurring within the last 10 years: No If all of the above answers are "NO", then may proceed with Cephalosporin use.     Medications:  Prior to Admission medications   Medication Sig Start Date End Date Taking? Authorizing Provider  ALPRAZolam Prudy Feeler(XANAX) 1 MG tablet Take 1 mg by mouth 3 (three) times daily as needed for anxiety.    Yes Historical Provider, MD  levofloxacin (LEVAQUIN) 500 MG tablet Take 1 tablet (500 mg total) by mouth daily. X 1 week 06/22/16  Yes  Ripudeep Jenna LuoK Rai, MD  predniSONE (STERAPRED UNI-PAK 21 TAB) 10 MG (21) TBPK tablet TAKE AS DIRECTED FOR 6 DAYS 08/13/16  Yes Historical Provider, MD    ROS:  Out of a complete 14 system review of symptoms, the patient complains only of the following symptoms, and all other reviewed systems are negative.  Anxiety Runny nose  Blood pressure 122/70, pulse 88, height 5\' 2"  (1.575 m), weight 87 lb (39.5 kg), last menstrual period 06/03/2014.  Physical Exam  General: The patient is alert and cooperative at the time of the examination.   Respiratory: Lung fields are clear  Cardiovascular: Regular rate and rhythm, no murmurs or rubs noted.  Neck: Neck is supple, no  carotid bruits are noted.    Eyes: Pupils are equal, round, and reactive to light, discs are flat bilaterally.   Neuromuscular: Range of movement of lumbar spine is full. No significant pain with palpation of the spine is noted.  Skin: No significant peripheral edema is noted.   Neurologic Exam  Mental status: The patient is alert and oriented x 3 at the time of the examination. The patient has apparent normal recent and remote memory, with an apparently normal attention span and concentration ability.   Cranial nerves: Facial symmetry is not present. There is a profound right peripheral facial weakness.  Speech is normal, no aphasia or dysarthria is noted. Extraocular movements are full. Visual fields are full.  Motor: The patient has good strength in all 4 extremities.  Sensory examination: Soft touch sensation is symmetric on the face, arms, and legs.Pinprick, soft touch, and vibration sensation is intact on all 4 extremities. No evidence of extinction is noted.   Coordination: The patient has good finger-nose-finger and heel-to-shin bilaterally.  Gait and station: The patient has a normal gait. Tandem gait is normal. Romberg is negative. No drift is seen.The patient is able to walk on heels and the toes bilaterally.   Reflexes: Deep tendon reflexes are symmetric.   Assessment/Plan:  1. Chronic low back pain, left greater than right lower extremity discomfort   Clinical examination today is unremarkable. The patient likely does not have true radicular pain syndrome, she may have pain associated with facet joint arthritis. At any rate, the patient has already seen another neurologist, workup is underway. I would agree with EMG and nerve conduction study evaluation, I would agree with the pain center referral. I have recommended that the patient go on long duration therapy with a nonsteroidal anti-inflammatory medication, and consider chiropractic treatments. The patient will  follow-up through this office on an as-needed basis. She has been on gabapentin in the past without much benefit.  Marlan Palau. Keith Demeisha Geraghty MD 09/09/2016 10:33 AM  [facility] [address] [place] [id number]  Phone [phone] Fax [phone]

## 2016-10-05 ENCOUNTER — Encounter: Payer: Medicaid Other | Admitting: Neurology

## 2016-10-15 ENCOUNTER — Encounter: Payer: Self-pay | Admitting: Physical Medicine & Rehabilitation

## 2016-10-15 ENCOUNTER — Encounter: Payer: Medicaid Other | Attending: Physical Medicine & Rehabilitation | Admitting: Physical Medicine & Rehabilitation

## 2016-10-15 DIAGNOSIS — N809 Endometriosis, unspecified: Secondary | ICD-10-CM | POA: Diagnosis not present

## 2016-10-15 DIAGNOSIS — F17210 Nicotine dependence, cigarettes, uncomplicated: Secondary | ICD-10-CM | POA: Insufficient documentation

## 2016-10-15 DIAGNOSIS — M545 Low back pain: Secondary | ICD-10-CM | POA: Diagnosis not present

## 2016-10-15 DIAGNOSIS — F419 Anxiety disorder, unspecified: Secondary | ICD-10-CM | POA: Diagnosis not present

## 2016-10-15 DIAGNOSIS — R51 Headache: Secondary | ICD-10-CM | POA: Diagnosis present

## 2016-10-15 DIAGNOSIS — G510 Bell's palsy: Secondary | ICD-10-CM | POA: Insufficient documentation

## 2016-10-15 DIAGNOSIS — F329 Major depressive disorder, single episode, unspecified: Secondary | ICD-10-CM | POA: Diagnosis not present

## 2016-10-15 DIAGNOSIS — G479 Sleep disorder, unspecified: Secondary | ICD-10-CM | POA: Insufficient documentation

## 2016-10-15 DIAGNOSIS — G8929 Other chronic pain: Secondary | ICD-10-CM

## 2016-10-15 DIAGNOSIS — Z79899 Other long term (current) drug therapy: Secondary | ICD-10-CM

## 2016-10-15 DIAGNOSIS — M791 Myalgia, unspecified site: Secondary | ICD-10-CM

## 2016-10-15 DIAGNOSIS — J45909 Unspecified asthma, uncomplicated: Secondary | ICD-10-CM | POA: Insufficient documentation

## 2016-10-15 DIAGNOSIS — G894 Chronic pain syndrome: Secondary | ICD-10-CM | POA: Diagnosis not present

## 2016-10-15 DIAGNOSIS — Z5181 Encounter for therapeutic drug level monitoring: Secondary | ICD-10-CM

## 2016-10-15 MED ORDER — AMITRIPTYLINE HCL 25 MG PO TABS: 25.0000 mg | ORAL_TABLET | Freq: Every day | ORAL | 1 refills | Status: DC

## 2016-10-15 MED ORDER — DULOXETINE HCL 30 MG PO CPEP
30.0000 mg | ORAL_CAPSULE | Freq: Every day | ORAL | 1 refills | Status: DC
Start: 2016-10-15 — End: 2017-04-20

## 2016-10-15 MED ORDER — MELOXICAM 15 MG PO TABS: 15.0000 mg | ORAL_TABLET | Freq: Every day | ORAL | 1 refills | Status: DC

## 2016-10-15 MED ORDER — METHOCARBAMOL 500 MG PO TABS: 500.0000 mg | ORAL_TABLET | Freq: Two times a day (BID) | ORAL | 1 refills | Status: DC | PRN

## 2016-10-15 NOTE — Addendum Note (Signed)
Addended by: Angela NevinWESSLING, Kealey Kemmer D on: 10/15/2016 02:38 PM   Modules accepted: Orders

## 2016-10-15 NOTE — Progress Notes (Signed)
Subjective:    Patient ID: Pamela Bailey, female    DOB: 11-22-1980, 36 y.o.   MRN: [id number]  HPI 36 y/o with pmh of headache, depression, anxiety, endometriosis presents with > lower back pain.  Started in 2010.  Denies inciting event.  Fetal position improves the pain.  Any activity exacerbates the pain.  Burning, tingling.  Radiates to stomach b/l.  Has intermittent numbness and weakness.  Has hydroconde, which helps, tramadol, and gabapentin help only a little. Denies falls. Pain limits ambulation and activities with children.  Pain Inventory Average Pain 6 Pain Right Now 6 My pain is sharp, burning, stabbing and aching  In the last 24 hours, has pain interfered with the following? General activity 8 Relation with others 8 Enjoyment of life 9 What TIME of day is your pain at its worst? morning, daytime Sleep (in general) Fair  Pain is worse with: walking, bending, sitting and standing Pain improves with: medication Relief from Meds: 8  Mobility walk without assistance how many minutes can you walk? 10 ability to climb steps?  yes do you drive?  yes Do you have any goals in this area?  yes  Function not employed: date last employed .  Neuro/Psych weakness numbness tingling anxiety  Prior Studies Any changes since last visit?  no  Physicians involved in your care Any changes since last visit?  no   Family History  Problem Relation Age of Onset  . Hypertension Maternal Grandfather   . Stroke Father   . Anesthesia problems Neg Hx    Social History   Social History  . Marital status: Significant Other    Spouse name: N/A  . Number of children: N/A  . Years of education: N/A   Social History Main Topics  . Smoking status: Current Every Day Smoker    Packs/day: 0.50    Years: 8.00    Types: Cigarettes  . Smokeless tobacco: Never Used  . Alcohol use No  . Drug use: No  . Sexual activity: Yes    Birth control/ protection: None   Other Topics  Concern  . None   Social History Narrative   Drinks about 1 cup of coffee a day    Past Surgical History:  Procedure Laterality Date  . ABDOMINAL HYSTERECTOMY    . ABDOMINAL SURGERY    . COSMETIC SURGERY    . LAPAROSCOPY    . NO PAST SURGERIES    . SALPINGECTOMY    . TYMPANOSTOMY TUBE PLACEMENT     Past Medical History:  Diagnosis Date  . Abnormal Pap smear    colpo 5/09  . Anxiety   . Asthma   . Cleft hard palate   . Cleft lip   . Depression   . Endometriosis   . Facial palsy   . Headache(784.0)   . Ovarian cyst   . PONV (postoperative nausea and vomiting)    LMP 06/03/2014   Opioid Risk Score:   Fall Risk Score:  `1  Depression screen PHQ 2/9  Depression screen PHQ 2/9 10/15/2016  Decreased Interest 3  Down, Depressed, Hopeless 1  PHQ - 2 Score 4  Altered sleeping 1  Tired, decreased energy 3  Change in appetite 1  Feeling bad or failure about yourself  3  Trouble concentrating 0  Moving slowly or fidgety/restless 0  Suicidal thoughts 0  PHQ-9 Score 12  Difficult doing work/chores Very difficult    Review of Systems  Constitutional: Positive for chills, diaphoresis  and fever.  HENT: Negative.   Eyes: Negative.   Respiratory: Positive for cough.   Cardiovascular: Negative.   Gastrointestinal: Positive for abdominal pain, nausea and vomiting.  Endocrine: Negative.   Genitourinary: Negative.   Musculoskeletal: Positive for back pain.  Skin: Negative.   Allergic/Immunologic: Negative.   Neurological: Positive for weakness and numbness.  Hematological: Negative.   Psychiatric/Behavioral: The patient is nervous/anxious.       Objective:   Physical Exam Gen: NAD. Vital signs reviewed HENT: Normocephalic, Atraumatic Eyes: EOMI, No discharge.  Cardio: RRR. No JVD.   Pulm: B/l clear to auscultation.  Effort normal Abd: Soft, BS+ MSK:  Gait WNL, including heel/toe.   TTP thoracic and lumbar PSPs  No edema.  Neuro: Right facial weakness/bells  palsy due to cleft lip/palate and multiple surgeries   Sensation intact to light touch in all LE dermatomes  Reflexes 3+ RLE  Strength  4+/5 in all LUE myotomes    4--4/5 in all RLE myotomes Skin: Warm and Dry. Warm and dry Psych: Tearful on exam    Assessment & Plan:  36 y/o with pmh of headache, depression, anxiety, endometriosis presents with > lower back pain.    1. Back pain  NCCSRS reviewed, ?multiple presribers  Labs reviewed  Referral information reviewed  Cont heat/cold  MRI 06/2016 showing cervical and thoracic disc bulges, but no cord and foraminal stenosis  Will order Cymbalta 30, increase on next visit  Will order Elavil 25mg   Will order Mobic 15mg  with food  Will order Robaxin 500 BID PRN  Pt had PT with no benefit, will consider referral in future with consideration for TENs eval  She has access to a pool during the summer only, [facility]  Will consider retrial of gabapentin/Gralise  MRI degraded, will consider repeat MRI in future  Will consider thoracic ESIs in future (?T11-T12)  Pt is going to be evaluated by spine surgeon as well  2. Chronic pain syndrome  See #1   3. Depression  Will order Cymbalta  Will order Elavil  4. Sleep disturbance  Will order Elavil  5. Bells Palsy  Pt to have surgery next month for facial reconstruction  6. Myalgia  Pt would like to hold off on trigger point injections at present, will consider in future

## 2016-10-22 LAB — TOXASSURE SELECT,+ANTIDEPR,UR

## 2016-10-22 NOTE — Progress Notes (Signed)
Urine drug screen for this encounter is consistent for prescribed medication 

## 2016-10-27 ENCOUNTER — Emergency Department (HOSPITAL_COMMUNITY)
Admission: EM | Admit: 2016-10-27 | Discharge: 2016-10-27 | Disposition: A | Discharge status: Home / Self Care | Payer: Medicaid Other

## 2016-10-30 ENCOUNTER — Encounter: Payer: Self-pay | Admitting: Emergency Medicine

## 2016-10-30 ENCOUNTER — Emergency Department: Payer: Medicaid Other

## 2016-10-30 ENCOUNTER — Emergency Department
Admission: EM | Admit: 2016-10-30 | Discharge: 2016-10-30 | Disposition: A | Discharge status: Home / Self Care | Payer: Medicaid Other | Attending: Emergency Medicine | Admitting: Emergency Medicine

## 2016-10-30 DIAGNOSIS — R42 Dizziness and giddiness: Secondary | ICD-10-CM | POA: Diagnosis not present

## 2016-10-30 DIAGNOSIS — F419 Anxiety disorder, unspecified: Secondary | ICD-10-CM | POA: Diagnosis not present

## 2016-10-30 DIAGNOSIS — R079 Chest pain, unspecified: Secondary | ICD-10-CM | POA: Diagnosis present

## 2016-10-30 DIAGNOSIS — F410 Panic disorder [episodic paroxysmal anxiety] without agoraphobia: Secondary | ICD-10-CM

## 2016-10-30 DIAGNOSIS — F17210 Nicotine dependence, cigarettes, uncomplicated: Secondary | ICD-10-CM | POA: Diagnosis not present

## 2016-10-30 DIAGNOSIS — J45909 Unspecified asthma, uncomplicated: Secondary | ICD-10-CM | POA: Insufficient documentation

## 2016-10-30 LAB — CBC
HEMATOCRIT: 41 % (ref 35.0–47.0)
HEMOGLOBIN: 13.9 g/dL (ref 12.0–16.0)
MCH: 31.8 pg (ref 26.0–34.0)
MCHC: 33.8 g/dL (ref 32.0–36.0)
MCV: 93.9 fL (ref 80.0–100.0)
Platelets: 196 10*3/uL (ref 150–440)
RBC: 4.37 MIL/uL (ref 3.80–5.20)
RDW: 12.4 % (ref 11.5–14.5)
WBC: 17.3 10*3/uL — ABNORMAL HIGH (ref 3.6–11.0)

## 2016-10-30 LAB — TROPONIN I

## 2016-10-30 LAB — BASIC METABOLIC PANEL
ANION GAP: 9 (ref 5–15)
BUN: 16 mg/dL (ref 6–20)
CALCIUM: 9.3 mg/dL (ref 8.9–10.3)
CHLORIDE: 105 mmol/L (ref 101–111)
CO2: 26 mmol/L (ref 22–32)
Creatinine, Ser: 0.7 mg/dL (ref 0.44–1.00)
GFR calc Af Amer: >60 mL/min (ref >60)
GFR calc non Af Amer: >60 mL/min (ref >60)
GLUCOSE: 79 mg/dL (ref 65–99)
Potassium: 4.4 mmol/L (ref 3.5–5.1)
Sodium: 140 mmol/L (ref 135–145)

## 2016-10-30 MED ORDER — DIAZEPAM 5 MG PO TABS
10.0000 mg | ORAL_TABLET | Freq: Once | ORAL | Status: AC
  Administered 2016-10-30: 10 mg via ORAL
  Filled 2016-10-30: qty 2

## 2016-10-30 NOTE — ED Triage Notes (Signed)
Pt ems from home for panic attack. Pt with hx of same. Pt states she has a lot of family issues. Took .5 mg xanax approx 10:30 this am.

## 2016-10-30 NOTE — ED Provider Notes (Addendum)
[facility] Emergency Department Provider Note        Time seen: ----------------------------------------- 4:00 PM on 10/30/2016 -----------------------------------------    I have reviewed the triage vital signs and the nursing notes.   HISTORY  Chief Complaint No chief complaint on file.    HPI Pamela Bailey is a 36 y.o. female who presents to the ER for shakiness and chest pain. Patient statesshe has been feeling this way due to increased stress. She denies any recent illness or other complaints. Currently the chest pain has improved but she still feels shaky and jittery. Patient had taken 0.5 mg Xanax at approximately 10:30 this morning with some improvement.   Past Medical History:  Diagnosis Date  . Abnormal Pap smear    colpo 5/09  . Anxiety   . Asthma   . Cleft hard palate   . Cleft lip   . Depression   . Endometriosis   . Facial palsy   . Headache(784.0)   . Ovarian cyst   . PONV (postoperative nausea and vomiting)     Patient Active Problem List   Diagnosis Date Noted  . Chronic midline low back pain without sciatica 09/09/2016  . Weakness 06/21/2016  . Left leg numbness 06/21/2016  . Numbness of upper extremity 06/21/2016  . Weakness of upper extremity 06/21/2016  . Arm weakness 06/21/2016  . Bilateral arm weakness   . Hypokalemia   . Leukocytosis   . 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 Chronic fatigue 07/16/2015  . Anxiety 06/04/2015  . Headache 08/30/2008  . NAUSEA 08/30/2008  . ABDOMINAL PAIN-MULTIPLE SITES 08/30/2008  . RENAL FAILURE, ACUTE 08/29/2008    Past Surgical History:  Procedure Laterality Date  . ABDOMINAL HYSTERECTOMY    . ABDOMINAL SURGERY    . COSMETIC SURGERY    . LAPAROSCOPY    . NO PAST SURGERIES    . SALPINGECTOMY    . TYMPANOSTOMY TUBE PLACEMENT      Allergies Azithromycin; Vancomycin; and Amoxicillin-pot clavulanate  Social History Social History  Substance Use Topics  . Smoking status:  Current Every Day Smoker    Packs/day: 0.50    Years: 8.00    Types: Cigarettes  . Smokeless tobacco: Never Used  . Alcohol use No    Review of Systems Constitutional: Negative for fever. Cardiovascular: Positive for chest pain Respiratory: Negative for shortness of breath. Gastrointestinal: Negative for abdominal pain, vomiting and diarrhea. Skin: Negative for rash. Neurological: Negative for headaches, focal weakness or numbness. Psychiatric: Positive for anxiety  10-point ROS otherwise negative.  ____________________________________________   PHYSICAL EXAM:  VITAL SIGNS: ED Triage Vitals  Enc Vitals Group     BP      Pulse      Resp      Temp      Temp src      SpO2      Weight      Height      Head Circumference      Peak Flow      Pain Score      Pain Loc      Pain Edu?      Excl. in GC?     Constitutional: Alert and oriented. Anxious and tearful Eyes: Conjunctivae are normal. Normal extraocular movements. ENT   Head: Normocephalic and atraumatic.   Nose: No congestion/rhinnorhea.   Mouth/Throat: Mucous membranes are moist.   Neck: No stridor. Cardiovascular: Normal rate, regular rhythm. No murmurs, rubs, or gallops. Respiratory: Normal respiratory effort  without tachypnea nor retractions. Breath sounds are clear and equal bilaterally. No wheezes/rales/rhonchi. Gastrointestinal: Soft and nontender. Normal bowel sounds Musculoskeletal: Nontender with normal range of motion in all extremities. No lower extremity tenderness nor edema. Neurologic:  Normal speech and language. No gross focal neurologic deficits are appreciated.  Skin:  Skin is warm, dry and intact. No rash noted. Psychiatric: Anxious mood and affect ____________________________________________  EKG: Interpreted by me. Sinus rhythm with a rate of 74 bpm, normal PR interval, normal QRS, normal QT, normal axis.  ____________________________________________  ED  COURSE:  Pertinent labs & imaging results that were available during my care of the patient were reviewed by me and considered in my medical decision making (see chart for details). Patient presents to the ER with symptoms of anxiety. We will assess with labs and imaging.   Procedures ____________________________________________   LABS (pertinent positives/negatives)  Labs Reviewed  CBC - Abnormal; Notable for the following:       Result Value   WBC 17.3 (*)    All other components within normal limits  BASIC METABOLIC PANEL  TROPONIN I    RADIOLOGY  Chest x-ray Is unremarkable IMPRESSION: No acute intracranial abnormality noted. ____________________________________________  FINAL ASSESSMENT AND PLAN  Chest pain, panic attack  Plan: Patient with labs and imaging as dictated above. Patient's symptoms are currently improved after oral Valium. She has just started BuSpar for generalized anxiety disorder and I will encourage her to continue this. She may need Xanax at least twice a day. She is stable for outpatient follow-up with her doctor.   Emily Filbert, MD   Note: This note was generated in part or whole with voice recognition software. Voice recognition is usually quite accurate but there are transcription errors that can and very often do occur. I apologize for any typographical errors that were not detected and corrected.     Emily Filbert, MD 10/30/16 1610    Emily Filbert, MD 10/30/16 [phone]

## 2016-11-02 ENCOUNTER — Telehealth: Payer: Self-pay

## 2016-11-02 NOTE — Telephone Encounter (Signed)
Patient reported today has been having side effects with the medication that was recently prescribed to her, states has been having panic attacks, tunnel vision, and other side effects as well, states also is still in pain and the mobic has not helped it, please advise

## 2016-11-02 NOTE — Telephone Encounter (Signed)
Which medication is causing her side effects?  Thanks

## 2016-11-03 NOTE — Telephone Encounter (Signed)
She is coming in 11/04/16 @ 1:00 and says she will discuss with him at that time.

## 2016-11-04 ENCOUNTER — Telehealth: Payer: Self-pay

## 2016-11-04 ENCOUNTER — Encounter: Payer: Medicaid Other | Admitting: Physical Medicine & Rehabilitation

## 2016-11-04 NOTE — Telephone Encounter (Signed)
Spoke with patient to advise her to bring this issue up to her doctor today. She states she had just received a call from our office and has an appointment Mon.

## 2016-11-04 NOTE — Telephone Encounter (Signed)
-----   Message from Sheryn BisonBrandy L Reed sent at 11/04/2016  8:35 AM EST ----- Regarding: needing to come in Contact: [phone] Pamela PoreHey Lecil Tapp  Tashaya Umble 02/25/1981. She left a voicemail to see if she could be seen today? She said she is having tunnel vision, headache and problems driving.   Thank you

## 2016-11-08 ENCOUNTER — Encounter: Payer: Self-pay | Admitting: Neurology

## 2016-11-08 ENCOUNTER — Ambulatory Visit (INDEPENDENT_AMBULATORY_CARE_PROVIDER_SITE_OTHER): Payer: Medicaid Other | Admitting: Neurology

## 2016-11-08 ENCOUNTER — Telehealth: Payer: Self-pay

## 2016-11-08 VITALS — BP 132/68 | HR 99 | Ht 62.0 in | Wt 85.2 lb

## 2016-11-08 DIAGNOSIS — R200 Anesthesia of skin: Secondary | ICD-10-CM

## 2016-11-08 DIAGNOSIS — R519 Headache, unspecified: Secondary | ICD-10-CM

## 2016-11-08 DIAGNOSIS — R51 Headache: Secondary | ICD-10-CM

## 2016-11-08 DIAGNOSIS — R202 Paresthesia of skin: Secondary | ICD-10-CM

## 2016-11-08 DIAGNOSIS — R42 Dizziness and giddiness: Secondary | ICD-10-CM

## 2016-11-08 DIAGNOSIS — M5440 Lumbago with sciatica, unspecified side: Secondary | ICD-10-CM

## 2016-11-08 DIAGNOSIS — M542 Cervicalgia: Secondary | ICD-10-CM

## 2016-11-08 MED ORDER — PREGABALIN 50 MG PO CAPS: ORAL_CAPSULE | ORAL | 0 refills | Status: DC

## 2016-11-08 NOTE — Telephone Encounter (Signed)
She should have an appointment to see me soon.  If she has an acute changes, bowel/bladder incontinence, sensory changes, increased weakness, night sweats, etc, she needs to go to ED. If not, we will evaluate on the next visit and determine what exactly she needs.  She should continue medications until that time. Thanks.

## 2016-11-08 NOTE — Telephone Encounter (Signed)
Patient called requesting MRI on her back, states still giving her issues.  States last mri done in October, please advise

## 2016-11-08 NOTE — Patient Instructions (Signed)
1. Schedule open MRI brain with and without contrast 2. Schedule EMG/NCV of both UE and both LE 3. Start Lyrica 50mg : Take 1 capsule at night for 1 week, then increase to 2 capsules at night for 1 week, then increase to 1 cap in AM, 2 caps in PM 4. Call Dr. Allena KatzPatel regarding back pain 5. Follow-up in 3 months

## 2016-11-08 NOTE — Progress Notes (Signed)
NEUROLOGY FOLLOW UP OFFICE NOTE  Pamela Bailey [id number]  HISTORY OF PRESENT ILLNESS: Pamela Bailey was seen in follow-up in the neurology clinic on 11/08/2016.  The patient was last seen 2 months ago for paresthesias starting in her left foot, moving to both legs and arms, then chest pain. She started having more pain in her lower back going down her legs. MRI brain, cervical, thoracic, and lumbar spine did not show any acute changes. There were disc bulges at several levels. She was scheduled for an EMG/NCV but cancelled the appointment because she was sick. She had seen Pain Management one time and was started on Elavil and Cymbalta. She states she did not start Cymbalta and only took one dose of Elavil which did not help. She tried low dose gabapentin prior to this, which she also reported as ineffective.  She presents today with new and different symptoms for the past 1.5 weeks where she has had waxing and waning frontal throbbing headaches with associated nausea/vomiting (vomited twice this week). Headaches last 10-30 minutes, then come back. She usually takes Ibuprofen 800mg  daily. She has had dizziness described as floating and blurry where she feels she cannot focus/brain fog/cloudy. She has worsened back pain and states she has not followed up with Pain Management yet. She has been unable to drive. She denies any diplopia, dysarthria/dysphagia, bowel/bladder dysfunction. She denies any recent infections or head injuries. No falls.   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 She reports having chronic low back pain with difficulty sitting for prolonged periods, however over the past 6 months, she started having numbness and  tingling in her left foot, moving up both legs and both arms, then chest pain. Episodes did not have a stereotypic nature, affecting either extremity, sometimes lasting all day. Sometimes she feels something wrapping around her abdomen. She tried taking hot baths, with no effect. She started having more pain in her lower back wrapping through her buttock region and going down her legs. She went to the ER on 06/20/16 for left leg weakness and bilateral arm weakness and numbness. She had an MRI of the brain, cervical, thoracic, and lumbar spine, which did not show any acute changes. I personally reviewed images, normal MRI brain. Spine imaging was degraded by motion artifact, but overall did not show any acute changes. There was a central disc protrusion at T10-11, indenting the ventral spinal cord, with no greater than mild spinal canal narrowing, no spinal cord lesion or cord compression, no cervical spinal canal or neural foraminal stenosis, small disc bulges at C3 through C5 levels and C4-5 annular fissure, lumbar spine normal. Left leg weakness improved. As soon as she got out of the hospital, she reports her right toe started getting numb. She also reports problems with her knees, they lock when she flexes them. She had seen Ortho and had xrays, given a knee brace to use. She has a little left-sided neck pain. She denies any recent heavy lifting or falls. She denies any bowel/bladder dysfunction. No family history of neuromuscular disease. She denies any headaches, dizziness, diplopia, dysarthria/dysphagia. For the past 6-7 months, she feels her left eye would twitch. She reports a lot of pain unrelieved with Tramadol and Flexeril, she was given 10  tablets of hydrocodone in the ER, which did help. She reports feeling uncomfortable sitting/standing.   PAST MEDICAL HISTORY: Past Medical History:  Diagnosis Date  . Abnormal Pap smear    colpo 5/09  . Anxiety   . Asthma   . Cleft hard palate   . Cleft lip     . Depression   . Endometriosis   . Facial palsy   . Headache(784.0)   . Ovarian cyst   . PONV (postoperative nausea and vomiting)     MEDICATIONS: Current Outpatient Prescriptions on File Prior to Visit  Medication Sig Dispense Refill  . ALPRAZolam (XANAX) 1 MG tablet Take 1 mg by mouth 3 (three) times daily as needed for anxiety.     Marland Kitchen amitriptyline (ELAVIL) 25 MG tablet Take 1 tablet (25 mg total) by mouth at bedtime. 30 tablet 1  . busPIRone (BUSPAR) 5 MG tablet Take 5 mg by mouth.    . DULoxetine (CYMBALTA) 30 MG capsule Take 1 capsule (30 mg total) by mouth daily. 30 capsule 1  . levofloxacin (LEVAQUIN) 500 MG tablet Take 1 tablet (500 mg total) by mouth daily. X 1 week (Patient not taking: Reported on 10/15/2016) 7 tablet 0  . meloxicam (MOBIC) 15 MG tablet Take 1 tablet (15 mg total) by mouth daily. 30 tablet 1  . methocarbamol (ROBAXIN) 500 MG tablet Take 1 tablet (500 mg total) by mouth 2 (two) times daily as needed for muscle spasms. 60 tablet 1  . predniSONE (STERAPRED UNI-PAK 21 TAB) 10 MG (21) TBPK tablet TAKE AS DIRECTED FOR 6 DAYS  0   No current facility-administered medications on file prior to visit.     ALLERGIES: Allergies  Allergen Reactions  . Azithromycin Other (See Comments)    Caused renal failure  . Vancomycin Other (See Comments)    Acute kidney injury   . Amoxicillin-Pot Clavulanate Nausea And Vomiting, Rash and Other (See Comments)    Has patient had a PCN reaction causing immediate rash, facial/tongue/throat swelling, SOB or lightheadedness with hypotension: No Has patient had a PCN reaction causing severe rash involving mucus membranes or skin necrosis: No Has patient had a PCN reaction that required hospitalization No Has patient had a PCN reaction occurring within the last 10 years: No If all of the above answers are "NO", then may proceed with Cephalosporin use.     FAMILY HISTORY: Family History  Problem Relation Age of Onset  .  Hypertension Maternal Grandfather   . Stroke Father   . Anesthesia problems Neg Hx     SOCIAL HISTORY: Social History   Social History  . Marital status: Single    Spouse name: N/A  . Number of children: N/A  . Years of education: N/A   Occupational History  . Not on file.   Social History Main Topics  . Smoking status: Current Every Day Smoker    Packs/day: 0.50    Years: 8.00    Types: Cigarettes  . Smokeless tobacco: Never Used  . Alcohol use No  . Drug use: No  . Sexual activity: Yes    Birth control/ protection: None   Other Topics Concern  . Not on file   Social History Narrative   Drinks about 1 cup of coffee a day     REVIEW OF SYSTEMS: Constitutional: No fevers, chills, or sweats, no generalized fatigue, change in appetite Eyes: No visual changes, double vision, eye pain Ear, nose and throat: No hearing loss, ear pain, nasal congestion, sore  throat Cardiovascular: No chest pain, palpitations Respiratory:  No shortness of breath at rest or with exertion, wheezes GastrointestinaI: No nausea, vomiting, diarrhea, abdominal pain, fecal incontinence Genitourinary:  No dysuria, urinary retention or frequency Musculoskeletal:  + neck pain, back pain Integumentary: No rash, pruritus, skin lesions Neurological: as above Psychiatric: No depression, insomnia, anxiety Endocrine: No palpitations, fatigue, diaphoresis, mood swings, change in appetite, change in weight, increased thirst Hematologic/Lymphatic:  No anemia, purpura, petechiae. Allergic/Immunologic: no itchy/runny eyes, nasal congestion, recent allergic reactions, rashes  PHYSICAL EXAM: Vitals:   11/08/16 1343  BP: 132/68  Pulse: 99   General: No acute distress, became tearful reporting dizziness upon standing Head:  Normocephalic/atraumatic Neck: supple, no paraspinal tenderness, full range of motion Heart:  Regular rate and rhythm Lungs:  Clear to auscultation bilaterally Back: No paraspinal  tenderness Skin/Extremities: No rash, no edema Neurological Exam: alert and oriented to person, place, and time, no dysarthria or aphasia, Fund of knowledge is appropriate.  Recent and remote memory are intact.  Attention and concentration are normal.    Able to name objects and repeat phrases. Cranial nerves: CN I: not tested CN II: pupils equal, round and reactive to light, visual fields intact, fundi unremarkable. CN III, IV, VI:  full range of motion, no nystagmus, no ptosis CN V: decreased on right V1-3 CN VII: weakness of the right frontalis, orbicularis oculi and oris (with Bell's phenomenon on right eye closure - unchanged) CN VIII: hearing intact to finger rub CN IX, X: gag intact, uvula midline CN XI: sternocleidomastoid and trapezius muscles intact CN XII: tongue midline Bulk & Tone: normal, no fasciculations. Motor: 5/5 throughout with no pronator drift. Sensation: patchy sensory changes, reporting decreased cold on right UE, left LE, decreased pin on right UE. Romberg test negative Deep Tendon Reflexes: brisk +3 throughout with +Hoffman sign on the right, no ankle clonus Plantar responses: downgoing bilaterally Cerebellar: no incoordination on finger to nose, heel to shin (reports pain on left hip flexion). No dysdiadochokinesia Gait: narrow-based and steady, able to tandem walk adequately. 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 In the hospital, concern was for radiculopathy.  She had imaging of her entire neuraxis which was essentially unremarkable, there were some disc bulges/protrusion without significant stenosis, although degraded  by motion. She presents today with new symptoms of a 1.5 week history of headaches with nausea/vomiting and dizziness. She had a head CT a week ago which was unremarkable. Her exam shows patchy sensory changes in the face and extremities and hyperreflexia, repeat MRI brain with and without contrast will be ordered. She continues to report back pain and was advised to call her Pain specialist. We discussed trying samples of Lyrica 50mg  qhs for headaches and neuropathic pain, side effects were discussed. She will proceed with EMG/NCV of both UE and LE to further evaluate her symptoms.We discussed potential effects of anxiety as well, she states anxiety is better. She will follow-up in 3 months and knows to call for any changes.   Thank you for allowing me to participate in her care.  Please do not hesitate to call for any questions or concerns.  The duration of this appointment  visit was 25 minutes of face-to-face time with the patient.  Greater than 50% of this time was spent in counseling, explanation of diagnosis, planning of further management, and coordination of care.   Patrcia DollyKaren Lessie Manigo, M.D.   CC: Dr. Allena KatzPatel, Burnett KanarisVIrginia Fulbright, PA-C

## 2016-11-09 NOTE — Telephone Encounter (Signed)
Bronte called back. I notified her of symptoms to go to ED for and otherwise he will evaluate at 11/18/16 appt. She agrees.

## 2016-11-09 NOTE — Telephone Encounter (Signed)
Attempted to reach Pamela Bailey but mailbox is full and not accepting messages at this time.  She has an appt 11/18/16 with Dr Allena KatzPatel.

## 2016-11-12 ENCOUNTER — Ambulatory Visit: Payer: Medicaid Other | Admitting: Physical Medicine & Rehabilitation

## 2016-11-16 ENCOUNTER — Telehealth: Payer: Self-pay | Admitting: Neurology

## 2016-11-16 ENCOUNTER — Ambulatory Visit (INDEPENDENT_AMBULATORY_CARE_PROVIDER_SITE_OTHER): Payer: Medicaid Other | Admitting: Neurology

## 2016-11-16 DIAGNOSIS — R202 Paresthesia of skin: Secondary | ICD-10-CM

## 2016-11-16 DIAGNOSIS — R200 Anesthesia of skin: Secondary | ICD-10-CM | POA: Diagnosis not present

## 2016-11-16 DIAGNOSIS — R519 Headache, unspecified: Secondary | ICD-10-CM

## 2016-11-16 DIAGNOSIS — R42 Dizziness and giddiness: Secondary | ICD-10-CM

## 2016-11-16 DIAGNOSIS — M5440 Lumbago with sciatica, unspecified side: Secondary | ICD-10-CM

## 2016-11-16 DIAGNOSIS — M542 Cervicalgia: Secondary | ICD-10-CM

## 2016-11-16 DIAGNOSIS — R51 Headache: Secondary | ICD-10-CM

## 2016-11-16 NOTE — Telephone Encounter (Signed)
Pamela Bailey 06/18/1981. She was calling to follow up on her referral for an MRI. She said she is having issues with her eye and she has recently had surgery on it. Her # is [phone]. Thank you

## 2016-11-16 NOTE — Procedures (Signed)
[facility] Neurology  [address] [place], [address]  [place], [place] [address] Tel: [phone] Fax:  [phone] Test Date:  11/16/2016  Patient: Pamela Bailey DOB: [birth year] Physician: Nita Sickle, DO  Sex: Female Height: 5\' 2"  Ref Phys: Patrcia Dolly, M.D.  ID#: [id number] Temp: 32.7C Technician: Judie Petit. Dean   Patient Complaints: This is a 36 year-old female with migratory paresthesias of the arms and legs.  NCV & EMG Findings: Extensive electrodiagnostic testing of the left arm and leg shows: 1. All sensory responses including the left median, ulnar, mixed palmer, sural, and superficial peroneal nerves are within normal limits. 2. All motor responses including the left median, ulnar, peroneal, and tibial nerves are within normal limits. 3. Left tibial H reflex study is within normal limits. 4. There is no evidence of active or chronic motor axon loss changes affecting any of the tested muscles. Motor unit configuration and recruitment pattern is within normal limits.   Impression: This is a normal study.  In particular, there is no evidence of a generalized sensorimotor polyneuropathy, cervical/lumbosacral radiculopathy or carpal tunnel syndrome affecting the left side.   ___________________________ Nita Sickle, DO    Nerve Conduction Studies Anti Sensory Summary Table   Site NR Peak (ms) Norm Peak (ms) P-T Amp (V) Norm P-T Amp  Left Median Anti Sensory (2nd Digit)  32.7C  Wrist    3.0 <3.4 45.4 >20  Left Sup Peroneal Anti Sensory (Ant Lat Mall)  32.7C  12 cm    3.4 <4.5 11.4 >5  Left Sural Anti Sensory (Lat Mall)  32.7C  Calf    3.6 <4.5 14.6 >5  Left Ulnar Anti Sensory (5th Digit)  32.7C  Wrist    2.9 <3.1 18.6 >12   Motor Summary Table   Site NR Onset (ms) Norm Onset (ms) O-P Amp (mV) Norm O-P Amp Site1 Site2 Delta-0 (ms) Dist (cm) Vel (m/s) Norm Vel (m/s)  Left Median Motor (Abd Poll Brev)  32.7C  Wrist    3.2 <3.9 7.5 >6 Elbow Wrist 3.8 19.0 50 >50  Elbow     7.0  7.1         Left Peroneal Motor (Ext Dig Brev)  32.7C  Ankle    5.5 <5.5 3.0 >3 B Fib Ankle 6.7 30.0 45 >40  B Fib    12.2  3.0  Poplt B Fib 0.8 5.0 62 >40  Poplt    13.0  3.0         Left Tibial Motor (Abd Hall Brev)  32.7C  Ankle    4.1 <6.0 13.4 >8 Knee Ankle 7.2 36.0 50 >40  Knee    11.3  10.6         Left Ulnar Motor (Abd Dig Minimi)  32.7C  Wrist    2.8 <3.1 8.4 >7 B Elbow Wrist 3.0 16.0 53 >50  B Elbow    5.8  8.2  A Elbow B Elbow 2.0 10.0 50 >50  A Elbow    7.8  8.0          Comparison Summary Table   Site NR Peak (ms) Norm Peak (ms) P-T Amp (V) Site1 Site2 Delta-P (ms) Norm Delta (ms)  Left Median/Ulnar Palm Comparison (Wrist - 8cm)  32.7C  Median Palm    1.6 <2.2 72.1 Median Palm Ulnar Palm 0.1   Ulnar Palm    1.7 <2.2 40.5       H Reflex Studies   NR H-Lat (ms) Lat Norm (ms) L-R  H-Lat (ms)  Left Tibial (Gastroc)  32.7C     31.16 <35    EMG   Side Muscle Ins Act Fibs Psw Fasc Number Recrt Dur Dur. Amp Amp. Poly Poly. Comment  Left 1stDorInt Nml Nml Nml Nml Nml Nml Nml Nml Nml Nml Nml Nml N/A  Left AntTibialis Nml Nml Nml Nml Nml Nml Nml Nml Nml Nml Nml Nml N/A  Left Gastroc Nml Nml Nml Nml Nml Nml Nml Nml Nml Nml Nml Nml N/A  Left Flex Dig Long Nml Nml Nml Nml Nml Nml Nml Nml Nml Nml Nml Nml N/A  Left RectFemoris Nml Nml Nml Nml Nml Nml Nml Nml Nml Nml Nml Nml N/A  Left GluteusMed Nml Nml Nml Nml Nml Nml Nml Nml Nml Nml Nml Nml N/A  Left Ext Indicis Nml Nml Nml Nml Nml Nml Nml Nml Nml Nml Nml Nml N/A  Left PronatorTeres Nml Nml Nml Nml Nml Nml Nml Nml Nml Nml Nml Nml N/A  Left Biceps Nml Nml Nml Nml Nml Nml Nml Nml Nml Nml Nml Nml N/A  Left Triceps Nml Nml Nml Nml Nml Nml Nml Nml Nml Nml Nml Nml N/A  Left Deltoid Nml Nml Nml Nml Nml Nml Nml Nml Nml Nml Nml Nml N/A      Waveforms:

## 2016-11-17 NOTE — Telephone Encounter (Signed)
Clld pt - LMOVM advsng Patricia/ [facility] will be contacting her to schedule MRI.

## 2016-11-18 ENCOUNTER — Encounter: Payer: Medicaid Other | Admitting: Physical Medicine & Rehabilitation

## 2016-11-19 ENCOUNTER — Emergency Department (HOSPITAL_COMMUNITY): Payer: Medicaid Other

## 2016-11-19 ENCOUNTER — Ambulatory Visit: Payer: Medicaid Other | Admitting: Neurology

## 2016-11-19 ENCOUNTER — Encounter (HOSPITAL_COMMUNITY): Payer: Self-pay | Admitting: Emergency Medicine

## 2016-11-19 ENCOUNTER — Emergency Department (HOSPITAL_COMMUNITY)
Admission: EM | Admit: 2016-11-19 | Discharge: 2016-11-19 | Disposition: A | Discharge status: Home / Self Care | Payer: Medicaid Other | Attending: Emergency Medicine | Admitting: Emergency Medicine

## 2016-11-19 DIAGNOSIS — F17210 Nicotine dependence, cigarettes, uncomplicated: Secondary | ICD-10-CM | POA: Diagnosis not present

## 2016-11-19 DIAGNOSIS — Z5181 Encounter for therapeutic drug level monitoring: Secondary | ICD-10-CM | POA: Diagnosis not present

## 2016-11-19 DIAGNOSIS — G43109 Migraine with aura, not intractable, without status migrainosus: Secondary | ICD-10-CM | POA: Diagnosis not present

## 2016-11-19 DIAGNOSIS — R531 Weakness: Secondary | ICD-10-CM

## 2016-11-19 DIAGNOSIS — J45909 Unspecified asthma, uncomplicated: Secondary | ICD-10-CM | POA: Diagnosis not present

## 2016-11-19 DIAGNOSIS — R51 Headache: Secondary | ICD-10-CM | POA: Diagnosis present

## 2016-11-19 LAB — DIFFERENTIAL
BASOS ABS: 0 10*3/uL (ref 0.0–0.1)
BASOS PCT: 0 %
Eosinophils Absolute: 0.3 10*3/uL (ref 0.0–0.7)
Eosinophils Relative: 2 %
LYMPHS PCT: 31 %
Lymphs Abs: 4 10*3/uL (ref 0.7–4.0)
MONOS PCT: 7 %
Monocytes Absolute: 1 10*3/uL (ref 0.1–1.0)
NEUTROS ABS: 7.8 10*3/uL — AB (ref 1.7–7.7)
Neutrophils Relative %: 60 %

## 2016-11-19 LAB — COMPREHENSIVE METABOLIC PANEL
ALK PHOS: 62 U/L (ref 38–126)
ALT: 20 U/L (ref 14–54)
AST: 21 U/L (ref 15–41)
Albumin: 4.3 g/dL (ref 3.5–5.0)
Anion gap: 9 (ref 5–15)
BUN: 21 mg/dL — AB (ref 6–20)
CO2: 26 mmol/L (ref 22–32)
CREATININE: 0.71 mg/dL (ref 0.44–1.00)
Calcium: 9.5 mg/dL (ref 8.9–10.3)
Chloride: 105 mmol/L (ref 101–111)
GFR calc Af Amer: >60 mL/min (ref >60)
GFR calc non Af Amer: >60 mL/min (ref >60)
Glucose, Bld: 94 mg/dL (ref 65–99)
Potassium: 3.4 mmol/L — ABNORMAL LOW (ref 3.5–5.1)
SODIUM: 140 mmol/L (ref 135–145)
Total Bilirubin: 0.1 mg/dL — ABNORMAL LOW (ref 0.3–1.2)
Total Protein: 6.7 g/dL (ref 6.5–8.1)

## 2016-11-19 LAB — CBC
HEMATOCRIT: 40.1 % (ref 36.0–46.0)
Hemoglobin: 13.5 g/dL (ref 12.0–15.0)
MCH: 31.6 pg (ref 26.0–34.0)
MCHC: 33.7 g/dL (ref 30.0–36.0)
MCV: 93.9 fL (ref 78.0–100.0)
PLATELETS: 203 10*3/uL (ref 150–400)
RBC: 4.27 MIL/uL (ref 3.87–5.11)
RDW: 12.3 % (ref 11.5–15.5)
WBC: 13 10*3/uL — AB (ref 4.0–10.5)

## 2016-11-19 LAB — TROPONIN I: Troponin I: 0.03 ng/mL (ref <0.03)

## 2016-11-19 LAB — PROTIME-INR
INR: 1.01
Prothrombin Time: 13.3 seconds (ref 11.4–15.2)

## 2016-11-19 LAB — APTT: APTT: 31 s (ref 24–36)

## 2016-11-19 LAB — ETHANOL

## 2016-11-19 MED ORDER — LORAZEPAM 2 MG/ML IJ SOLN
1.0000 mg | Freq: Once | INTRAMUSCULAR | Status: AC
  Administered 2016-11-19: 1 mg via INTRAVENOUS
  Filled 2016-11-19: qty 1

## 2016-11-19 MED ORDER — ONDANSETRON HCL 4 MG/2ML IJ SOLN
4.0000 mg | Freq: Once | INTRAMUSCULAR | Status: AC
  Administered 2016-11-19: 4 mg via INTRAVENOUS
  Filled 2016-11-19: qty 2

## 2016-11-19 MED ORDER — AMITRIPTYLINE HCL 25 MG PO TABS: 25.0000 mg | ORAL_TABLET | Freq: Every day | ORAL | 1 refills | Status: DC

## 2016-11-19 MED ORDER — ONDANSETRON HCL 8 MG PO TABS: 8.0000 mg | ORAL_TABLET | Freq: Three times a day (TID) | ORAL | 0 refills | Status: DC | PRN

## 2016-11-19 MED ORDER — GADOBENATE DIMEGLUMINE 529 MG/ML IV SOLN
10.0000 mL | Freq: Once | INTRAVENOUS | Status: AC
  Administered 2016-11-19: 8 mL via INTRAVENOUS

## 2016-11-19 NOTE — Consult Note (Signed)
Neurology Consultation Reason for Consult: Left-sided tingling Referring Physician: Effie Shy, E  CC: Left-sided weakness and tingling  History is obtained from: Patient  HPI: Pamela BENEVIDES is a 36 y.o. female who has been having recurrent episodes for the past several months of left-sided weakness and tingling. I saw her initially in October at which time, imaging was negative. Given that her complaints could've been radicular, spinal imaging was performed as well which was also negative.  Since that time, she has recurrent episodes of face arm and leg weakness and tingling as well as visual change. She does describe some wavy lines associate with her visual change. She is often this to a day, averaging probably one every other day. These are often associated with photophobic headache. They're also often associated with nausea.  She is being evaluated by her neurologist Dr. Karel Jarvis for the symptoms, and is undergone an EMG and was planning to have a repeat MRI.  She also been prescribed amitriptyline, though she is not sure what for. She is not yet started this.  She describes the paresthesias starting in her toe and then gradually working his way up her leg.  LKW: 4:45 PM tpa given?: no, mild symptoms    ROS: A 14 point ROS was performed and is negative except as noted in the HPI.   Past Medical History:  Diagnosis Date  . Abnormal Pap smear    colpo 5/09  . Anxiety   . Asthma   . Cleft hard palate   . Cleft lip   . Depression   . Endometriosis   . Facial palsy   . Headache(784.0)   . Ovarian cyst   . PONV (postoperative nausea and vomiting)      Family History  Problem Relation Age of Onset  . Hypertension Maternal Grandfather   . Stroke Father   . Anesthesia problems Neg Hx      Social History:  reports that she has been smoking Cigarettes.  She has a 4.00 pack-year smoking history. She has never used smokeless tobacco. She reports that she does not drink alcohol  or use drugs.   Exam: Current vital signs: BP 118/74   Pulse 82   Temp 98.1 F (36.7 C)   Resp 18   LMP 06/03/2014   SpO2 98%  Vital signs in last 24 hours: Temp:  [98.1 F (36.7 C)] 98.1 F (36.7 C) (03/02 1839) Pulse Rate:  [82-94] 82 (03/02 1830) Resp:  [18-22] 18 (03/02 1830) BP: (118-135)/(74-88) 118/74 (03/02 1830) SpO2:  [98 %-100 %] 98 % (03/02 1830)   Physical Exam  Constitutional: Appears well-developed and well-nourished.  Psych: Affect appropriate to situation Eyes: No scleral injection HENT: No OP obstrucion Head: Normocephalic.  Cardiovascular: Normal rate and regular rhythm.  Respiratory: Effort normal and breath sounds normal to anterior ascultation GI: Soft.  No distension. There is no tenderness.  Skin: WDI  Neuro: Mental Status: Patient is awake, alert, oriented to person, place, month, year, and situation. Patient is able to give a clear and coherent history. No signs of aphasia or neglect Cranial Nerves: II: Visual Fields are full. Pupils are equal, round, and reactive to light.   III,IV, VI: EOMI without ptosis or diploplia.  V: Facial sensation is symmetric to temperature VII: Facial movement with possible mild left weakness, she has chronic right facial weakness VIII: hearing is intact to voice X: Uvula elevates symmetrically XI: Shoulder shrug is symmetric. XII: tongue is midline without atrophy or fasciculations.  Motor:  Tone is normal. Bulk is normal. She has mild drift in the left arm, a little the left leg without drift Sensory: Sensation is diminished on the left Cerebellar: No clear ataxia on finger nose finger   I have reviewed labs in epic and the results pertinent to this consultation are: CMP-unremarkable  Impression: 36 year old female with recurrent episodes of migrating paresthesia, weakness, visual change(with wavy lines) associated with headache and nausea. I strongly suspect that these represent migraines.    Recommendations: 1) MRI brain 2) start amitriptyline 25 daily at bedtime 3) if MRI brain is negative, would treat as complicated migraine  Ritta SlotMcNeill Kirkpatrick, MD [facility] [phone]  If 7pm- 7am, please page neurology on call as listed in [facility]ION.

## 2016-11-19 NOTE — ED Notes (Signed)
Patient transported to MRI 

## 2016-11-19 NOTE — Discharge Instructions (Signed)
Testing today did not indicate that you had a stroke.  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  Starting Elavil (amitriptyline), should help prevent further migraines, but will take some time to work.  It is important to follow up with your neurologist for further evaluation and treatment.  Try to stop smoking.

## 2016-11-19 NOTE — ED Provider Notes (Signed)
[facility]-EMERGENCY DEPT Provider Note   CSN: [id number] Arrival date & time: 11/19/16  1740     History   Chief Complaint Chief Complaint  Patient presents with  . Dizziness    HPI Pamela Bailey is a 36 y.o. female.  She presents for evaluation of recurrent symptoms, previously diagnosed as complex migraine.  She was at home today when she developed left-sided headache associated with tingling on left side of her body and altered vision in her left eye with the appearance of "a flashing cursor between every other ladder on the screen".  The left side of her face feels heavy.  She has had similar symptoms to this 1-2 times a day, for 1 month, following an increasing amount of stress with her mother.  She lives with her children and husband.  She has stopped driving because of the episodes.  She has been seen by multiple physicians including a primary care doctor, who diagnosed otitis media and treated her with an antibiotic and meclizine.  She also saw an eye doctor who felt that she did not have a high problem.  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  She has had EMG testing.  She was prescribed Elavil, for stress, but has not started taking it.  She has been seeing a counselor but it did not really help so she stopped.  She smokes cigarettes and is trying to stop.  She denies fever, chills, cough, shortness of breath, or abdominal pain, or change in her back pain.  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  She has a left TM ventilating tube, the right one fell out.  There are no other known modifying factors.  HPI  Past Medical History:  Diagnosis Date  . Abnormal Pap smear    colpo 5/09  . Anxiety   . Asthma   . Cleft hard palate   . Cleft lip   . Depression   . Endometriosis   . Facial palsy   . Headache(784.0)   . Ovarian cyst   . PONV  (postoperative nausea and vomiting)     Patient Active Problem List   Diagnosis Date Noted  . Chronic midline low back pain without sciatica 09/09/2016  . Weakness 06/21/2016  . Left leg numbness 06/21/2016  . Numbness of upper extremity 06/21/2016  . Weakness of upper extremity 06/21/2016  . Arm weakness 06/21/2016  . Bilateral arm weakness   . Hypokalemia   . Leukocytosis   . 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 Chronic fatigue 07/16/2015  . Anxiety 06/04/2015  . Headache 08/30/2008  . NAUSEA 08/30/2008  . ABDOMINAL PAIN-MULTIPLE SITES 08/30/2008  . RENAL FAILURE, ACUTE 08/29/2008    Past Surgical History:  Procedure Laterality Date  . ABDOMINAL HYSTERECTOMY    . ABDOMINAL SURGERY    . COSMETIC SURGERY    . LAPAROSCOPY    . NO PAST SURGERIES    . SALPINGECTOMY    . TYMPANOSTOMY TUBE PLACEMENT      OB History    Gravida Para Term Preterm AB Living   2 2 2     2    SAB TAB Ectopic Multiple Live Births           2       Home Medications    Prior to Admission medications   Medication Sig Start Date End  Date Taking? Authorizing Provider  ALPRAZolam Prudy Feeler) 1 MG tablet Take 1 mg by mouth 3 (three) times daily as needed for anxiety.    Yes Historical Provider, MD  meclizine (ANTIVERT) 25 MG tablet Take 25 mg by mouth 3 (three) times daily as needed for dizziness. 11/09/16  Yes Historical Provider, MD  ondansetron (ZOFRAN-ODT) 4 MG disintegrating tablet Take 4 mg by mouth every 8 (eight) hours as needed for nausea or vomiting. DISSOLVE IN MOUTH 11/12/16 11/19/16 Yes Historical Provider, MD  amitriptyline (ELAVIL) 25 MG tablet Take 1 tablet (25 mg total) by mouth at bedtime. 11/19/16   Pamela Bale, MD  DULoxetine (CYMBALTA) 30 MG capsule Take 1 capsule (30 mg total) by mouth daily. Patient not taking: Reported on 11/08/2016 10/15/16   Pamela Karis Juba, MD  levofloxacin (LEVAQUIN) 500 MG tablet Take 1 tablet (500 mg total) by mouth daily. X 1 week Patient not taking: Reported on  10/15/2016 06/22/16   Pamela Jenna Luo, MD  meloxicam (MOBIC) 15 MG tablet Take 1 tablet (15 mg total) by mouth daily. Patient not taking: Reported on 11/08/2016 10/15/16   Pamela Karis Juba, MD  methocarbamol (ROBAXIN) 500 MG tablet Take 1 tablet (500 mg total) by mouth 2 (two) times daily as needed for muscle spasms. Patient not taking: Reported on 11/08/2016 10/15/16   Pamela Karis Juba, MD  pregabalin (LYRICA) 50 MG capsule Take 1 capsule at night for 1 week, then increase to 2 capsules at night for 1 week, then increase to 1 cap in AM, 2 caps in PM Patient not taking: Reported on 11/19/2016 11/08/16   Pamela Clines, MD    Family History Family History  Problem Relation Age of Onset  . Hypertension Maternal Grandfather   . Stroke Father   . Anesthesia problems Neg Hx     Social History Social History  Substance Use Topics  . Smoking status: Current Every Day Smoker    Packs/day: 0.50    Years: 8.00    Types: Cigarettes  . Smokeless tobacco: Never Used  . Alcohol use No     Allergies   Azithromycin; Vancomycin; Oxycodone; and Amoxicillin-pot clavulanate   Review of Systems Review of Systems  All other systems reviewed and are negative.    Physical Exam Updated Vital Signs BP 117/84   Pulse 76   Temp 98.1 F (36.7 C)   Resp 18   LMP 06/03/2014   SpO2 98%   Physical Exam  Constitutional: She is oriented to person, place, and time. She appears well-developed and well-nourished. No distress.  HENT:  Head: Normocephalic and atraumatic.  Right tympanic membrane appears normal.  Left tympanic membrane has normal landmarks and a ventilating tube inferiorly which is not draining any material.  Eyes: Conjunctivae and EOM are normal. Pupils are equal, round, and reactive to light.  Neck: Normal range of motion and phonation normal. Neck supple.  Cardiovascular: Normal rate and regular rhythm.   Pulmonary/Chest: Effort normal and breath sounds normal. She exhibits no tenderness.    Abdominal: Soft. She exhibits no distension. There is no tenderness. There is no guarding.  Musculoskeletal: Normal range of motion.  Neurological: She is alert and oriented to person, place, and time. A cranial nerve deficit is present. She exhibits normal muscle tone.  She has a facial asymmetry, and notable scars consistent with cleft lip and palate.  Right face is essentially nonmobile.  There is a scar above her right ear.  She has good grip strength bilaterally.  Heel-to-shin is normal bilaterally.  She has mildly decreased light touch sensation in the left hand and left leg.  Skin: Skin is warm and dry.  Psychiatric: Her behavior is normal. Judgment and thought content normal.  Tearful during conversations about stress in her life  Nursing note and vitals reviewed.    ED Treatments / Results  Labs (all labs ordered are listed, but only abnormal results are displayed) Labs Reviewed  CBC - Abnormal; Notable for the following:       Result Value   WBC 13.0 (*)    All other components within normal limits  DIFFERENTIAL - Abnormal; Notable for the following:    Neutro Abs 7.8 (*)    All other components within normal limits  COMPREHENSIVE METABOLIC PANEL - Abnormal; Notable for the following:    Potassium 3.4 (*)    BUN 21 (*)    Total Bilirubin 0.1 (*)    All other components within normal limits  TROPONIN I - Abnormal; Notable for the following:    Troponin I 0.03 (*)    All other components within normal limits  ETHANOL  PROTIME-INR  APTT  RAPID URINE DRUG SCREEN, HOSP PERFORMED  URINALYSIS, ROUTINE W REFLEX MICROSCOPIC    EKG  EKG Interpretation None       Radiology Mr Laqueta Jean Wo Contrast  Result Date: 11/19/2016 CLINICAL DATA:  Blurred vision. Headache. Facial numbness. Difficulty with word-finding. Left-sided weakness. EXAM: MRI HEAD WITHOUT AND WITH CONTRAST TECHNIQUE: Multiplanar, multiecho pulse sequences of the brain and surrounding structures were obtained  without and with intravenous contrast. CONTRAST:  8mL MULTIHANCE GADOBENATE DIMEGLUMINE 529 MG/ML IV SOLN COMPARISON:  CT head without contrast 10/30/2016. FINDINGS: Brain: No acute infarct, hemorrhage, or mass lesion is present. The ventricles are of normal size. No significant extraaxial fluid collection is present. The internal auditory canals are within normal limits bilaterally. The brainstem and cerebellum are normal. Postcontrast images demonstrate no pathologic enhancement. The dural sinuses opacify normally. Vascular: Flow is present in the major intracranial arteries. Skull and upper cervical spine: The skullbase is within normal limits. The craniocervical junction is normal. Midline sagittal structures are unremarkable. Sinuses/Orbits: Right maxillary sinus surgery is noted. There is evidence of chronic disease on the right. There is partial opacification of the right frontal sinus. Anterior ethmoid disease is present bilaterally. The remaining paranasal sinuses are clear. Bilateral mastoid effusions are present. A mucosal polyp in the posterior nasopharynx measures 8 mm. IMPRESSION: 1. Normal MRI appearance of the brain. No acute or focal lesion to explain the patient's symptoms. 2. Chronic right maxillary sinus disease and previous surgery. 3. Bilateral mastoid effusions. 4. Focal mucosal enhancement is present in the nasopharynx. This may represent a polyp. Recommend nonemergent ENT consultation for direct visualization. The lesion does not appear to be obstructive and is likely inflammatory. Electronically Signed   By: Marin Roberts M.D.   On: 11/19/2016 19:58    Procedures Procedures (including critical care time)  Medications Ordered in ED Medications  ondansetron (ZOFRAN) injection 4 mg (4 mg Intravenous Given 11/19/16 1838)  LORazepam (ATIVAN) injection 1 mg (1 mg Intravenous Given 11/19/16 1838)  gadobenate dimeglumine (MULTIHANCE) injection 10 mL (8 mLs Intravenous Contrast Given  11/19/16 1929)     Initial Impression / Assessment and Plan / ED Course  I have reviewed the triage vital signs and the nursing notes.  Pertinent labs & imaging results that were available during my care of the patient were reviewed by me and considered  in my medical decision making (see chart for details).  Clinical Course as of Nov 20 2047  Caleen Essex Nov 19, 2016  1820 The patient was seen on arrival by neurology, who canceled code stroke and suggested an MRI versus CT imaging to avoid radiation.  Dr. Amada Jupiter and I saw the patient together, including interview and physical examination.  [EW]  2041 Reviewed the note written by Dr. Amada Jupiter, who feels with a negative MRI she can be discharged, and take Elavil, as a preventative for migraine headaches.  Additionally she should follow-up with her neurologist regarding further treatment as needed.  [EW]    Clinical Course User Index [EW] Pamela Bale, MD    Medications  ondansetron [facility]) injection 4 mg (4 mg Intravenous Given 11/19/16 1838)  LORazepam (ATIVAN) injection 1 mg (1 mg Intravenous Given 11/19/16 1838)  gadobenate dimeglumine (MULTIHANCE) injection 10 mL (8 mLs Intravenous Contrast Given 11/19/16 1929)    Patient Vitals for the past 24 hrs:  BP Temp Pulse Resp SpO2  11/19/16 2045 117/84 - 76 - 98 %  11/19/16 2000 110/72 - 81 - 100 %  11/19/16 1839 - 98.1 F (36.7 C) - - -  11/19/16 1830 118/74 - 82 18 98 %  11/19/16 1800 135/88 - 89 19 100 %  11/19/16 1759 - - 94 22 100 %    8:45 PM Reevaluation with update and discussion. After initial assessment and treatment, an updated evaluation reveals she is comfortable at this time and has no additional complaints.  She now states that she lost her Elavil prescription which had been given to her previously.  Findings discussed with the patient and all questions answered. Darsha Zumstein L    Final Clinical Impressions(s) / ED Diagnoses   Final diagnoses:  Complicated migraine     Ongoing headaches, with visual changes and paresthesias, with negative ED evaluation.  Etiology most likely complicated migraines.  She was previously prescribed Elavil but has not started taking it yet.  Elavil could help decrease the incidence of migraines.  She is already seeing neurology, for back problems, and can see the same neurologist for evaluation and treatment of her migraine disorder.  Nursing Notes Reviewed/ Care Coordinated Applicable Imaging Reviewed Interpretation of Laboratory Data incorporated into ED treatment  The patient appears reasonably screened and/or stabilized for discharge and I doubt any other medical condition or other [facility] requiring further screening, evaluation, or treatment in the ED at this time prior to discharge.  Plan: Home Medications-start Elavil as soon as possible, continue usual medications; Home Treatments-rest; return here if the recommended treatment, does not improve the symptoms; Recommended follow up-PCP as needed.  Neurology follow-up as scheduled.   New Prescriptions Current Discharge Medication List       Pamela Bale, MD 11/19/16 2051

## 2016-11-19 NOTE — ED Triage Notes (Signed)
Per Pt stated she is having blurred vision, headache, and some facial numbness.She stated she is having a harder time saying her word. Per EMS she was alert and oriented X 4. Some weakness in her left side some pt stated she has a bulging disc in her back that was causing her complication in her back that causes the weakness.

## 2016-11-21 ENCOUNTER — Other Ambulatory Visit: Payer: Medicaid Other

## 2016-11-22 ENCOUNTER — Telehealth: Payer: Self-pay | Admitting: Neurology

## 2016-11-22 ENCOUNTER — Ambulatory Visit: Payer: Medicaid Other | Admitting: Neurology

## 2016-11-22 DIAGNOSIS — M545 Low back pain, unspecified: Secondary | ICD-10-CM

## 2016-11-22 DIAGNOSIS — G8929 Other chronic pain: Secondary | ICD-10-CM

## 2016-11-22 NOTE — Telephone Encounter (Signed)
Clld pt  -advsd of results. Pt states [facility]16 due to Migraine. Pt stated she is wondering of going to a spine specialist would help her? I advsd I would forward message to provider.  Please advise.

## 2016-11-22 NOTE — Telephone Encounter (Signed)
Patient is calling and would like her EMG results please call hear t [phone]

## 2016-11-22 NOTE — Telephone Encounter (Signed)
Will forward message to provider. 

## 2016-11-22 NOTE — Telephone Encounter (Signed)
Pls let her know the nerve test was normal, her nerves look healthy, the pain is most likely musculoskeletal, continue follow-up with Pain mgt. When is she scheduled for the MRI brain? Thanks

## 2016-11-23 NOTE — Telephone Encounter (Signed)
Pls send referral to Ortho for back pain and let patient know. Thanks!

## 2016-11-23 NOTE — Telephone Encounter (Signed)
[facility] Called patient to inform. No answer. Left vmail.

## 2016-12-06 NOTE — Telephone Encounter (Signed)
Received response from Pamela Bailey/[facility] - pt has been made aware why she cannot be scheduled at this time.  Will forward note to provider to advise of other options.

## 2016-12-07 NOTE — Telephone Encounter (Signed)
Per provider - Clld pt LMOVMTC re other options for referral re her back pain.

## 2016-12-07 NOTE — Telephone Encounter (Signed)
Refer to [facility] if she prefers. Thanks

## 2017-02-02 ENCOUNTER — Ambulatory Visit: Payer: Medicaid Other | Admitting: Neurology

## 2017-03-15 ENCOUNTER — Encounter (HOSPITAL_COMMUNITY): Payer: Self-pay | Admitting: Vascular Surgery

## 2017-03-15 ENCOUNTER — Emergency Department (HOSPITAL_COMMUNITY)
Admission: EM | Admit: 2017-03-15 | Discharge: 2017-03-15 | Disposition: A | Discharge status: Home / Self Care | Payer: Medicaid Other | Attending: Emergency Medicine | Admitting: Emergency Medicine

## 2017-03-15 DIAGNOSIS — J45909 Unspecified asthma, uncomplicated: Secondary | ICD-10-CM | POA: Insufficient documentation

## 2017-03-15 DIAGNOSIS — F17210 Nicotine dependence, cigarettes, uncomplicated: Secondary | ICD-10-CM | POA: Insufficient documentation

## 2017-03-15 DIAGNOSIS — M542 Cervicalgia: Secondary | ICD-10-CM | POA: Insufficient documentation

## 2017-03-15 DIAGNOSIS — G8929 Other chronic pain: Secondary | ICD-10-CM | POA: Diagnosis present

## 2017-03-15 DIAGNOSIS — R42 Dizziness and giddiness: Secondary | ICD-10-CM | POA: Insufficient documentation

## 2017-03-15 DIAGNOSIS — Z79899 Other long term (current) drug therapy: Secondary | ICD-10-CM | POA: Insufficient documentation

## 2017-03-15 LAB — BASIC METABOLIC PANEL
ANION GAP: 7 (ref 5–15)
BUN: 21 mg/dL — ABNORMAL HIGH (ref 6–20)
CHLORIDE: 106 mmol/L (ref 101–111)
CO2: 26 mmol/L (ref 22–32)
Calcium: 9.6 mg/dL (ref 8.9–10.3)
Creatinine, Ser: 0.66 mg/dL (ref 0.44–1.00)
GFR calc Af Amer: >60 mL/min (ref >60)
GLUCOSE: 87 mg/dL (ref 65–99)
POTASSIUM: 5 mmol/L (ref 3.5–5.1)
SODIUM: 139 mmol/L (ref 135–145)

## 2017-03-15 LAB — CBC
HEMATOCRIT: 42.3 % (ref 36.0–46.0)
Hemoglobin: 14.2 g/dL (ref 12.0–15.0)
MCH: 32 pg (ref 26.0–34.0)
MCHC: 33.6 g/dL (ref 30.0–36.0)
MCV: 95.3 fL (ref 78.0–100.0)
Platelets: 223 10*3/uL (ref 150–400)
RBC: 4.44 MIL/uL (ref 3.87–5.11)
RDW: 12.3 % (ref 11.5–15.5)
WBC: 9.8 10*3/uL (ref 4.0–10.5)

## 2017-03-15 LAB — URINALYSIS, ROUTINE W REFLEX MICROSCOPIC
Bilirubin Urine: NEGATIVE
Glucose, UA: NEGATIVE mg/dL
Hgb urine dipstick: NEGATIVE
Ketones, ur: NEGATIVE mg/dL
LEUKOCYTES UA: NEGATIVE
NITRITE: NEGATIVE
PH: 7 (ref 5.0–8.0)
Protein, ur: NEGATIVE mg/dL
SPECIFIC GRAVITY, URINE: 1.011 (ref 1.005–1.030)

## 2017-03-15 MED ORDER — CYCLOBENZAPRINE HCL 10 MG PO TABS
5.0000 mg | ORAL_TABLET | Freq: Once | ORAL | Status: AC
  Administered 2017-03-15: 5 mg via ORAL
  Filled 2017-03-15: qty 1

## 2017-03-15 MED ORDER — CYCLOBENZAPRINE HCL 10 MG PO TABS: 10.0000 mg | ORAL_TABLET | Freq: Two times a day (BID) | ORAL | 0 refills | Status: DC | PRN

## 2017-03-15 NOTE — Discharge Instructions (Signed)
Please read attached information. If you experience any new or worsening signs or symptoms please return to the emergency room for evaluation. Please follow-up with your primary care provider or specialist as discussed. Please use medication prescribed only as directed and discontinue taking if you have any concerning signs or symptoms.   °

## 2017-03-15 NOTE — ED Triage Notes (Signed)
Pt reports to the ED via [facility] from home for eval of neck pain. Per EMS she started feeling dizzy today. She has hx of vertigo. She is also complaining of neck pain. She has hx of bulging disk in her neck and her pain became worse after the dizziness. Pt began to feel anxious after the pain started so she took a Xanax which helped her anxiety but not he pain and dizziness. 12 lead unremarkable.

## 2017-03-15 NOTE — ED Provider Notes (Signed)
[facility]-EMERGENCY DEPT Provider Note   CSN: [id number] Arrival date & time: 03/15/17  1130     History   Chief Complaint Chief Complaint  Patient presents with  . Neck Pain    HPI Pamela Bailey is a 36 y.o. female.  HPI     36 year old female presents today with chronic neck pain.  Patient also reports chronic vertigo.  Patient has been seen by numerous providers including neurology, ENT, emergency medicine, primary care, and orthopedics with complaints of vertigo and neck pain.  She has tried numerous modalities with no significant improvement in her symptoms, reporting the only medication that seems to help is Xanax for the vertigo, and hydrocodone for the neck pain.  She reports that she woke up this morning had an episode of vertigo as if the room was spinning.  She reports this is worse with side-to-side head movements and moving her eyes.  She notes she took Xanax which improved her symptoms.  She notes her left lateral neck and shoulder were painful again today, noting yesterday she did not have significant symptoms.  She reports these radiate into her left arm numbness and tingling.  Patient has had MRIs of her brain several times, cervical thoracic and lumbar spine, showing mild disc protrusion, no cord impingement.  Patient reports she is awaiting neurosurgical evaluation.  She denies any dramatic changes to her baseline.  She denies any headache or acute neurologic deficits at this time.    Past Medical History:  Diagnosis Date  . Abnormal Pap smear    colpo 5/09  . Anxiety   . Asthma   . Cleft hard palate   . Cleft lip   . Depression   . Endometriosis   . Facial palsy   . Headache(784.0)   . Ovarian cyst   . PONV (postoperative nausea and vomiting)     Patient Active Problem List   Diagnosis Date Noted  . Chronic midline low back pain without sciatica 09/09/2016  . Weakness 06/21/2016  . Left leg numbness 06/21/2016  . Numbness of upper extremity 06/21/2016    . Weakness of upper extremity 06/21/2016  . Arm weakness 06/21/2016  . Bilateral arm weakness   . Hypokalemia   . Leukocytosis   . 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 Chronic fatigue 07/16/2015  . Anxiety 06/04/2015  . Headache 08/30/2008  . NAUSEA 08/30/2008  . ABDOMINAL PAIN-MULTIPLE SITES 08/30/2008  . RENAL FAILURE, ACUTE 08/29/2008    Past Surgical History:  Procedure Laterality Date  . ABDOMINAL HYSTERECTOMY    . ABDOMINAL SURGERY    . COSMETIC SURGERY    . LAPAROSCOPY    . NO PAST SURGERIES    . SALPINGECTOMY    . TYMPANOSTOMY TUBE PLACEMENT      OB History    Gravida Para Term Preterm AB Living   2 2 2     2    SAB TAB Ectopic Multiple Live Births           2       Home Medications    Prior to Admission medications   Medication Sig Start Date End Date Taking? Authorizing Provider  ALPRAZolam Prudy Feeler) 1 MG tablet Take 1 mg by mouth 3 (three) times daily as needed for anxiety.     [provider]  amitriptyline (ELAVIL) 25 MG tablet Take 1 tablet (25 mg total) by mouth at bedtime. 11/19/16   [name], MD  cyclobenzaprine (FLEXERIL) 10 MG tablet Take 1 tablet (10 mg total)  by mouth 2 (two) times daily as needed for muscle spasms. 03/15/17   Mercia Dowe, Tinnie Gens, PA-C  DULoxetine (CYMBALTA) 30 MG capsule Take 1 capsule (30 mg total) by mouth daily. Patient not taking: Reported on 11/08/2016 10/15/16   Marcello Fennel, MD  levofloxacin (LEVAQUIN) 500 MG tablet Take 1 tablet (500 mg total) by mouth daily. X 1 week Patient not taking: Reported on 10/15/2016 06/22/16   Cathren Harsh, MD  meclizine (ANTIVERT) 25 MG tablet Take 25 mg by mouth 3 (three) times daily as needed for dizziness. 11/09/16   [provider]  meloxicam (MOBIC) 15 MG tablet Take 1 tablet (15 mg total) by mouth daily. Patient not taking: Reported on 11/08/2016 10/15/16   Marcello Fennel, MD  methocarbamol (ROBAXIN) 500 MG tablet Take 1 tablet (500 mg total) by mouth 2 (two) times daily as  needed for muscle spasms. Patient not taking: Reported on 11/08/2016 10/15/16   Marcello Fennel, MD  ondansetron (ZOFRAN) 8 MG tablet Take 1 tablet (8 mg total) by mouth every 8 (eight) hours as needed for nausea or vomiting. 11/19/16   [name], MD  pregabalin (LYRICA) 50 MG capsule Take 1 capsule at night for 1 week, then increase to 2 capsules at night for 1 week, then increase to 1 cap in AM, 2 caps in PM Patient not taking: Reported on 11/19/2016 11/08/16   [name], MD    Family History Family History  Problem Relation Age of Onset  . Hypertension Maternal Grandfather   . Stroke Father   . Anesthesia problems Neg Hx     Social History Social History  Substance Use Topics  . Smoking status: Current Every Day Smoker    Packs/day: 0.50    Years: 8.00    Types: Cigarettes  . Smokeless tobacco: Never Used  . Alcohol use No     Allergies   Azithromycin; Vancomycin; Oxycodone; and Amoxicillin-pot clavulanate   Review of Systems Review of Systems  All other systems reviewed and are negative.    Physical Exam Updated Vital Signs BP 114/68 (BP Location: Right Arm)   Pulse 82   Temp 98.7 F (37.1 C) (Oral)   Resp 16   LMP 06/03/2014   SpO2 100%   Physical Exam  Constitutional: She is oriented to person, place, and time. She appears well-developed and well-nourished.  HENT:  Head: Normocephalic and atraumatic.  Eyes: Conjunctivae are normal. Pupils are equal, round, and reactive to light. Right eye exhibits no discharge. Left eye exhibits no discharge. No scleral icterus.  Neck: Normal range of motion. No JVD present. No tracheal deviation present.  Pulmonary/Chest: Effort normal. No stridor.  Musculoskeletal:  TTP of right upper trapezius   Neurological: She is alert and oriented to person, place, and time. She has normal strength. No cranial nerve deficit or sensory deficit. Coordination normal. GCS eye subscore is 4. GCS verbal subscore is 5. GCS motor  subscore is 6.  Psychiatric: She has a normal mood and affect. Her behavior is normal. Judgment and thought content normal.  Nursing note and vitals reviewed.    ED Treatments / Results  Labs (all labs ordered are listed, but only abnormal results are displayed) Labs Reviewed  BASIC METABOLIC PANEL - Abnormal; Notable for the following:       Result Value   BUN 21 (*)    All other components within normal limits  URINALYSIS, ROUTINE W REFLEX MICROSCOPIC - Abnormal; Notable for the following:  Color, Urine STRAW (*)    All other components within normal limits  CBC    EKG  EKG Interpretation None       Radiology No results found.  Procedures Procedures (including critical care time)  Medications Ordered in ED Medications  cyclobenzaprine (FLEXERIL) tablet 5 mg (5 mg Oral Given 03/15/17 1421)     Initial Impression / Assessment and Plan / ED Course  I have reviewed the triage vital signs and the nursing notes.  Pertinent labs & imaging results that were available during my care of the patient were reviewed by me and considered in my medical decision making (see chart for details).      Final Clinical Impressions(s) / ED Diagnoses   Final diagnoses:  Vertigo  Neck pain    Labs: CBC, BMP, urinalysis  Imaging:  Consults:  Therapeutics: Flexeril  Discharge Meds: Flexeril  Assessment/Plan: 36 year old female presents today with numerous complaints.  Patient has a long-standing history of vertigo and neck pain.  She has been seen by numerous providers through numerous different specialties.  She has had nerve conduction studies done, MRIs of the head neck and back.  Patient's vertigo likely peripheral, low suspicion for central cause.  She is also having neck and shoulder pain that is chronic in nature.  Patient reports that hydrocodone is only medication that has seemed to help her.  I do not feel it is appropriate to continue giving hydrocodone for chronic  pain.  Patient referred to neurosurgery for ongoing evaluation and management, return precautions given.  She verbalized understanding and agreement to this plan and had no further questions or concerns at time of discharge.      New Prescriptions Discharge Medication List as of 03/15/2017  2:36 PM    START taking these medications   Details  cyclobenzaprine (FLEXERIL) 10 MG tablet Take 1 tablet (10 mg total) by mouth 2 (two) times daily as needed for muscle spasms., Starting Tue 03/15/2017, Print         Thaddius Manes, [place], PA-C 03/15/17 1440    Alvira MondaySchlossman, Erin, MD 03/16/17 1424

## 2017-04-20 ENCOUNTER — Encounter (HOSPITAL_BASED_OUTPATIENT_CLINIC_OR_DEPARTMENT_OTHER): Payer: Self-pay | Admitting: *Deleted

## 2017-04-20 ENCOUNTER — Emergency Department (HOSPITAL_BASED_OUTPATIENT_CLINIC_OR_DEPARTMENT_OTHER): Payer: Medicaid Other

## 2017-04-20 ENCOUNTER — Emergency Department (HOSPITAL_BASED_OUTPATIENT_CLINIC_OR_DEPARTMENT_OTHER)
Admission: EM | Admit: 2017-04-20 | Discharge: 2017-04-20 | Disposition: A | Discharge status: Home / Self Care | Payer: Medicaid Other | Attending: Physician Assistant | Admitting: Physician Assistant

## 2017-04-20 DIAGNOSIS — J45909 Unspecified asthma, uncomplicated: Secondary | ICD-10-CM | POA: Insufficient documentation

## 2017-04-20 DIAGNOSIS — R109 Unspecified abdominal pain: Secondary | ICD-10-CM | POA: Diagnosis present

## 2017-04-20 DIAGNOSIS — R1031 Right lower quadrant pain: Secondary | ICD-10-CM | POA: Diagnosis not present

## 2017-04-20 DIAGNOSIS — Z79899 Other long term (current) drug therapy: Secondary | ICD-10-CM | POA: Insufficient documentation

## 2017-04-20 DIAGNOSIS — F17210 Nicotine dependence, cigarettes, uncomplicated: Secondary | ICD-10-CM | POA: Diagnosis not present

## 2017-04-20 LAB — COMPREHENSIVE METABOLIC PANEL
ALBUMIN: 4.4 g/dL (ref 3.5–5.0)
ALK PHOS: 86 U/L (ref 38–126)
ALT: 17 U/L (ref 14–54)
ANION GAP: 11 (ref 5–15)
AST: 23 U/L (ref 15–41)
BUN: 18 mg/dL (ref 6–20)
CALCIUM: 9.4 mg/dL (ref 8.9–10.3)
CO2: 27 mmol/L (ref 22–32)
CREATININE: 0.79 mg/dL (ref 0.44–1.00)
Chloride: 99 mmol/L — ABNORMAL LOW (ref 101–111)
GFR calc Af Amer: >60 mL/min (ref >60)
GFR calc non Af Amer: >60 mL/min (ref >60)
GLUCOSE: 98 mg/dL (ref 65–99)
Potassium: 4 mmol/L (ref 3.5–5.1)
SODIUM: 137 mmol/L (ref 135–145)
Total Bilirubin: 0.4 mg/dL (ref 0.3–1.2)
Total Protein: 7.2 g/dL (ref 6.5–8.1)

## 2017-04-20 LAB — CBC WITH DIFFERENTIAL/PLATELET
BASOS PCT: 0 %
Basophils Absolute: 0 10*3/uL (ref 0.0–0.1)
EOS PCT: 0 %
Eosinophils Absolute: 0 10*3/uL (ref 0.0–0.7)
HCT: 45.4 % (ref 36.0–46.0)
Hemoglobin: 15.6 g/dL — ABNORMAL HIGH (ref 12.0–15.0)
Lymphocytes Relative: 12 %
Lymphs Abs: 1.5 10*3/uL (ref 0.7–4.0)
MCH: 32 pg (ref 26.0–34.0)
MCHC: 34.4 g/dL (ref 30.0–36.0)
MCV: 93 fL (ref 78.0–100.0)
MONO ABS: 0.9 10*3/uL (ref 0.1–1.0)
MONOS PCT: 7 %
NEUTROS ABS: 9.8 10*3/uL — AB (ref 1.7–7.7)
Neutrophils Relative %: 81 %
PLATELETS: 200 10*3/uL (ref 150–400)
RBC: 4.88 MIL/uL (ref 3.87–5.11)
RDW: 12.3 % (ref 11.5–15.5)
WBC: 12.2 10*3/uL — ABNORMAL HIGH (ref 4.0–10.5)

## 2017-04-20 LAB — LIPASE, BLOOD: LIPASE: 26 U/L (ref 11–51)

## 2017-04-20 LAB — URINALYSIS, ROUTINE W REFLEX MICROSCOPIC
Glucose, UA: NEGATIVE mg/dL
KETONES UR: 15 mg/dL — AB
NITRITE: NEGATIVE
PROTEIN: 30 mg/dL — AB
Specific Gravity, Urine: 1.036 — ABNORMAL HIGH (ref 1.005–1.030)
pH: 6 (ref 5.0–8.0)

## 2017-04-20 LAB — URINALYSIS, MICROSCOPIC (REFLEX)

## 2017-04-20 MED ORDER — FENTANYL CITRATE (PF) 100 MCG/2ML IJ SOLN
25.0000 ug | Freq: Once | INTRAMUSCULAR | Status: AC
Start: 2017-04-20 — End: 2017-04-20
  Administered 2017-04-20: 25 ug via INTRAVENOUS

## 2017-04-20 MED ORDER — SODIUM CHLORIDE 0.9 % IV BOLUS (SEPSIS)
1000.0000 mL | Freq: Once | INTRAVENOUS | Status: AC
  Administered 2017-04-20: 1000 mL via INTRAVENOUS

## 2017-04-20 MED ORDER — HYDROCODONE-ACETAMINOPHEN 5-325 MG PO TABS: 1.0000 | ORAL_TABLET | Freq: Four times a day (QID) | ORAL | 0 refills | Status: DC | PRN

## 2017-04-20 MED ORDER — FENTANYL CITRATE (PF) 100 MCG/2ML IJ SOLN
INTRAMUSCULAR | Status: AC
  Filled 2017-04-20: qty 2

## 2017-04-20 MED ORDER — ONDANSETRON 4 MG PO TBDP: 4.0000 mg | ORAL_TABLET | Freq: Three times a day (TID) | ORAL | 0 refills | Status: DC | PRN

## 2017-04-20 MED ORDER — DICYCLOMINE HCL 20 MG PO TABS: 20.0000 mg | ORAL_TABLET | Freq: Two times a day (BID) | ORAL | 0 refills | Status: DC

## 2017-04-20 MED ORDER — IOPAMIDOL (ISOVUE-300) INJECTION 61%
100.0000 mL | Freq: Once | INTRAVENOUS | Status: AC | PRN
  Administered 2017-04-20: 100 mL via INTRAVENOUS

## 2017-04-20 MED ORDER — FENTANYL CITRATE (PF) 100 MCG/2ML IJ SOLN
25.0000 ug | Freq: Once | INTRAMUSCULAR | Status: AC
  Administered 2017-04-20: 25 ug via INTRAVENOUS
  Filled 2017-04-20: qty 2

## 2017-04-20 MED ORDER — ONDANSETRON HCL 4 MG/2ML IJ SOLN
4.0000 mg | Freq: Once | INTRAMUSCULAR | Status: AC
  Administered 2017-04-20: 4 mg via INTRAVENOUS
  Filled 2017-04-20: qty 2

## 2017-04-20 NOTE — Discharge Instructions (Signed)
We are unsure what is causing your symptoms. Please pay close attention to your symptoms and return as needed.

## 2017-04-20 NOTE — ED Triage Notes (Signed)
Pt c/o right lower abd pain with n/v x  2days Sent here from [facility] office for eval.

## 2017-04-20 NOTE — ED Provider Notes (Signed)
[facility]-EMERGENCY DEPT Provider Note   CSN: [id number] Arrival date & time: 04/20/17  1731  By signing my name below, I, Rosana Fretana Waskiewicz, attest that this documentation has been prepared under the direction and in the presence of Kaydin Labo, Cindee Saltourteney Lyn, MD. Electronically Signed: Rosana Fretana Waskiewicz, ED Scribe. 04/20/17. 6:06 PM.  History   Chief Complaint Chief Complaint  Patient presents with  . Abdominal Pain   The history is provided by the patient. No language interpreter was used.   HPI Comments: Pamela Bailey is a 36 y.o. female with a PMHx of hysterectomy, who presents to the Emergency Department complaining of constant, moderate RLQ abdominal pain onset 2 days ago. [facility] and sent her to rule out serious abdominal problems. Pt reports associated nausea, vomiting and fever of 101 for the past 2 days. Pt denies diarrhea or any other complaints at this time.  Past Medical History:  Diagnosis Date  . Abnormal Pap smear    colpo 5/09  . Anxiety   . Asthma   . Cleft hard palate   . Cleft lip   . Depression   . Endometriosis   . Facial palsy   . Headache(784.0)   . Ovarian cyst   . PONV (postoperative nausea and vomiting)     Patient Active Problem List   Diagnosis Date Noted  . Chronic midline low back pain without sciatica 09/09/2016  . Weakness 06/21/2016  . Left leg numbness 06/21/2016  . Numbness of upper extremity 06/21/2016  . Weakness of upper extremity 06/21/2016  . Arm weakness 06/21/2016  . Bilateral arm weakness   . Hypokalemia   . Leukocytosis   . 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 Chronic fatigue 07/16/2015  . Anxiety 06/04/2015  . Headache 08/30/2008  . NAUSEA 08/30/2008  . ABDOMINAL PAIN-MULTIPLE SITES 08/30/2008  . RENAL FAILURE, ACUTE 08/29/2008    Past Surgical History:  Procedure Laterality Date  . ABDOMINAL HYSTERECTOMY    . ABDOMINAL SURGERY    . COSMETIC SURGERY    . LAPAROSCOPY    . NO PAST SURGERIES    . SALPINGECTOMY    .  TYMPANOSTOMY TUBE PLACEMENT      OB History    Gravida Para Term Preterm AB Living   2 2 2     2    SAB TAB Ectopic Multiple Live Births           2       Home Medications    Prior to Admission medications   Medication Sig Start Date End Date Taking? Authorizing Provider  ibuprofen (ADVIL,MOTRIN) 100 MG/5ML suspension Take 600 mg by mouth every 4 (four) hours as needed.   Yes [provider]  ALPRAZolam Prudy Feeler(XANAX) 1 MG tablet Take 1 mg by mouth 3 (three) times daily as needed for anxiety.     [provider]  dicyclomine (BENTYL) 20 MG tablet Take 1 tablet (20 mg total) by mouth 2 (two) times daily. 04/20/17   Alenna Russell Lyn, MD  HYDROcodone-acetaminophen (NORCO/VICODIN) 5-325 MG tablet Take 1 tablet by mouth every 6 (six) hours as needed. 04/20/17   Janeya Deyo Lyn, MD  ondansetron (ZOFRAN ODT) 4 MG disintegrating tablet Take 1 tablet (4 mg total) by mouth every 8 (eight) hours as needed for nausea or vomiting. 04/20/17   Huie Ghuman Lyn, MD  ondansetron (ZOFRAN) 8 MG tablet Take 1 tablet (8 mg total) by mouth every 8 (eight) hours as needed for nausea or vomiting. 11/19/16  [name], MD    Family History Family History  Problem Relation Age of Onset  . Hypertension Maternal Grandfather   . Stroke Father   . Anesthesia problems Neg Hx     Social History Social History  Substance Use Topics  . Smoking status: Current Every Day Smoker    Packs/day: 0.50    Years: 8.00    Types: Cigarettes  . Smokeless tobacco: Never Used  . Alcohol use No     Allergies   Azithromycin; Vancomycin; Oxycodone; and Amoxicillin-pot clavulanate   Review of Systems Review of Systems  Constitutional: Positive for fever.  Gastrointestinal: Positive for abdominal pain, nausea and vomiting. Negative for diarrhea.  All other systems reviewed and are negative.    Physical Exam Updated Vital Signs BP 110/81 (BP Location: Left Arm)   Pulse 95   Temp 99.3  F (37.4 C) (Oral)   Resp 14   Ht 5\' 2"  (1.575 m)   Wt 39.3 kg (86 lb 9.6 oz)   LMP 06/03/2014   SpO2 98%   BMI 15.84 kg/m   Physical Exam  Constitutional: She is oriented to person, place, and time. She appears well-developed and well-nourished.  HENT:  Head: Normocephalic.  Eyes: EOM are normal.  Neck: Normal range of motion.  Cardiovascular: Normal rate, regular rhythm and normal heart sounds.  Exam reveals no gallop and no friction rub.   No murmur heard. Pulmonary/Chest: Effort normal and breath sounds normal. No respiratory distress. She has no wheezes. She has no rales.  Abdominal: Soft. She exhibits no distension and no mass. There is tenderness. There is no rebound and no guarding.  RLQ pain worse than RUQ pain.  Musculoskeletal: Normal range of motion.  Neurological: She is alert and oriented to person, place, and time.  Skin:  Chronic right facial droop.   Psychiatric: She has a normal mood and affect.  Nursing note and vitals reviewed.    ED Treatments / Results  DIAGNOSTIC STUDIES: Oxygen Saturation is 99% on RA, normal by my interpretation.   COORDINATION OF CARE: 6:04 PM-Discussed next steps with pt including a CT scan. Pt verbalized understanding and is agreeable with the plan.   Labs (all labs ordered are listed, but only abnormal results are displayed) Labs Reviewed  COMPREHENSIVE METABOLIC PANEL - Abnormal; Notable for the following:       Result Value   Chloride 99 (*)    All other components within normal limits  CBC WITH DIFFERENTIAL/PLATELET - Abnormal; Notable for the following:    WBC 12.2 (*)    Hemoglobin 15.6 (*)    Neutro Abs 9.8 (*)    All other components within normal limits  URINALYSIS, ROUTINE W REFLEX MICROSCOPIC - Abnormal; Notable for the following:    Color, Urine AMBER (*)    Specific Gravity, Urine 1.036 (*)    Hgb urine dipstick SMALL (*)    Bilirubin Urine SMALL (*)    Ketones, ur 15 (*)    Protein, ur 30 (*)     Leukocytes, UA TRACE (*)    All other components within normal limits  URINALYSIS, MICROSCOPIC (REFLEX) - Abnormal; Notable for the following:    Bacteria, UA FEW (*)    Squamous Epithelial / LPF 0-5 (*)    All other components within normal limits  LIPASE, BLOOD    EKG  EKG Interpretation None       Radiology Ct Abdomen Pelvis W Contrast  Result Date: 04/20/2017 CLINICAL DATA:  Right lower  quadrant pain. Nausea, vomiting and fever. EXAM: CT ABDOMEN AND PELVIS WITH CONTRAST TECHNIQUE: Multidetector CT imaging of the abdomen and pelvis was performed using the standard protocol following bolus administration of intravenous contrast. CONTRAST:  100mL ISOVUE-300 IOPAMIDOL (ISOVUE-300) INJECTION 61% COMPARISON:  CT abdomen pelvis 12/19/2015 FINDINGS: Lower chest: No pulmonary nodules or pleural effusion. No visible pericardial effusion. Hepatobiliary: Normal hepatic contours and density. No visible biliary dilatation. Normal gallbladder. Pancreas: Normal contours without ductal dilatation. No peripancreatic fluid collection. Spleen: Normal. Adrenals/Urinary Tract: --Adrenal glands: Normal. --Right kidney/ureter: No hydronephrosis or perinephric stranding. No nephrolithiasis. No obstructing ureteral stones. --Left kidney/ureter: No hydronephrosis or perinephric stranding. No nephrolithiasis. No obstructing ureteral stones. --Urinary bladder: Unremarkable. Stomach/Bowel: --Stomach/Duodenum: No hiatal hernia or other gastric abnormality. Normal duodenal course and caliber. --Small bowel: No dilatation or inflammation. --Colon: No focal abnormality. --Appendix: Normal. Vascular/Lymphatic: Normal course and caliber of the major abdominal vessels. No abdominal or pelvic lymphadenopathy. Reproductive: Status post hysterectomy. No adnexal mass. Musculoskeletal. No bony spinal canal stenosis or focal osseous abnormality. Other: None. IMPRESSION: No acute abdominopelvic abnormality. Electronically Signed   By:  Deatra RobinsonKevin  Herman M.D.   On: 04/20/2017 20:23    Procedures Procedures (including critical care time)  Medications Ordered in ED Medications  sodium chloride 0.9 % bolus 1,000 mL (0 mLs Intravenous Stopped 04/20/17 2136)  fentaNYL (SUBLIMAZE) injection 25 mcg (25 mcg Intravenous Given 04/20/17 1812)  ondansetron (ZOFRAN) injection 4 mg (4 mg Intravenous Given 04/20/17 1812)  iopamidol (ISOVUE-300) 61 % injection 100 mL (100 mLs Intravenous Contrast Given 04/20/17 2008)  fentaNYL (SUBLIMAZE) injection 25 mcg (25 mcg Intravenous Given 04/20/17 2132)     Initial Impression / Assessment and Plan / ED Course  I have reviewed the triage vital signs and the nursing notes.  Pertinent labs & imaging results that were available during my care of the patient were reviewed by me and considered in my medical decision making (see chart for details).     I personally performed the services described in this documentation, which was scribed in my presence. The recorded information has been reviewed and is accurate.    Well-appearing 36 year old female presenting with vague abdominal pain. Patient sent here for CT. CT is negative. Patient taking by mouth without issue. We'll give him pain medications, and nausea medications and follow-up with PCP. Return vital signs labs and CT. Suspect this could be viral gastroenteritis.  Final Clinical Impressions(s) / ED Diagnoses   Final diagnoses:  Right lower quadrant abdominal pain    New Prescriptions Discharge Medication List as of 04/20/2017  9:57 PM    START taking these medications   Details  dicyclomine (BENTYL) 20 MG tablet Take 1 tablet (20 mg total) by mouth 2 (two) times daily., Starting Wed 04/20/2017, Print    HYDROcodone-acetaminophen (NORCO/VICODIN) 5-325 MG tablet Take 1 tablet by mouth every 6 (six) hours as needed., Starting Wed 04/20/2017, Print    ondansetron (ZOFRAN ODT) 4 MG disintegrating tablet Take 1 tablet (4 mg total) by mouth every 8 (eight)  hours as needed for nausea or vomiting., Starting Wed 04/20/2017, Print         Berdene Askari Lyn, MD 04/22/17 0000

## 2017-12-07 ENCOUNTER — Other Ambulatory Visit: Payer: Self-pay

## 2017-12-07 ENCOUNTER — Ambulatory Visit: Payer: Medicaid Other | Admitting: Student in an Organized Health Care Education/Training Program

## 2017-12-07 ENCOUNTER — Encounter: Payer: Self-pay | Admitting: Student in an Organized Health Care Education/Training Program

## 2017-12-07 DIAGNOSIS — Z0000 Encounter for general adult medical examination without abnormal findings: Secondary | ICD-10-CM | POA: Diagnosis present

## 2017-12-07 DIAGNOSIS — Z1389 Encounter for screening for other disorder: Secondary | ICD-10-CM

## 2017-12-07 NOTE — Patient Instructions (Addendum)
It was a pleasure seeing you today in our clinic. Here is the treatment plan we have discussed and agreed upon together:  Please schedule a visit with Dr. Raymondo BandKoval to discuss smoking cessation.  Please schedule a follow up appointment to discuss anxiety.  Our clinic's number is [phone]. Please call with questions or concerns about what we discussed today.  Be well, Dr. Mosetta PuttFeng

## 2017-12-07 NOTE — Assessment & Plan Note (Signed)
-   Patient interested in tobacco cessation, referred to Dr. Raymondo BandKoval for a designated tobacco cessation visit - PMH, PSH, SH, FH reviewed with patient - patient to schedule follow up to discuss anxiety/potential controller medications other than xanax. She understands we do not prescribe controlled substances at the first visit and that we may try other medications in the future other than xanax

## 2017-12-07 NOTE — Progress Notes (Addendum)
CC: Visit to establish care  HPI: Pamela Bailey is a 37 y.o. female with PMH significant for chronic pain, asthma who [facility] to establish care as a new patient.  PMH: 1. Asthma - reported by patient, occasionally uses albuterol once every 3-4 months, does not remember PFTs. 2. Anxiety - managed by old PCP, was taking 1 mg Xanax TID 3. Chronic pain - abdominal pain (endometriosis, hx of partial hysterectomy, [facility] thought to be fibromyalgia by previous doctor per patient 4. 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 Vertigo - patient states was due to tensing of neck and shoulder, has improved after shoulder surgery  PSH: 1. Hysterectomy 2015 - partial, left ovaries 2. Shoulder surgery 2018 3. 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 HTN - mother 2. T2Diabetes - father 3. Colon cancer - grandmother, unsure when she got colon cancer but she passed away at 38 years old 4. Prostate cancer - father 5. Cardiac disease/MI - father at age 38  Social Hx: - Patient lives at with husbandhome with her children who are 505 and 845 years old.  - 4.5 pack/year smoking - Denies recreational drug use - Occasional alcohol use - Denies being at risk for STD - Endorses feeling safe in her relationships  Allergies: 1. Vancomycin - intolerance, had renal failure with this medication in the past Azithromycin and amox/clavulanate are on med list, she states she is NOT allergic to these.  Meds: 1. Xanax 1 mg TID every day 2. Zofran 8 mg q2-4H PRN   Overdue health maintenance: Due for PAP >> has had hysterectomy and patient states cervix was removed  Review of Symptoms:  See HPI for ROS.   CC, SH/smoking status, and VS noted.  Objective: BP 100/62   Pulse 81   Temp 98.3 F (36.8 C) (Oral)   Ht 5\' 2"  (1.575 m)   Wt 90 lb (40.8 kg)   LMP 06/03/2014   SpO2 99%   BMI 16.46 kg/m    GEN: NAD, alert, cooperative, baseline R facial droop EYE: no conjunctival injection, pupils equally round and reactive to light ENMT: normal tympanic light reflex, no nasal polyps,no rhinorrhea, no pharyngeal erythema or exudates NECK: full ROM, no thyromegaly RESPIRATORY: clear to auscultation bilaterally with no wheezes, rhonchi or rales, good effort CV: RRR, no m/r/g, no peripheral edema GI: soft, non-tender, non-distended, no hepatosplenomegaly SKIN: warm and dry, no rashes or lesions NEURO: +R facial palsy, otherwise CN intact, normal gait, peripheral sensation intact PSYCH: AAOx3, appropriate affect  GAD 7 : Generalized Anxiety Score 12/07/2017  Nervous, Anxious, on Edge 3  Control/stop worrying 2  Worry too much - different things 0  Trouble relaxing 0  Restless 0  Easily annoyed or irritable 3  Afraid - awful might happen 0  Total GAD 7 Score 8  Anxiety Difficulty Somewhat difficult    Dep[facility] screen [facility] 12/07/2017 10/15/2016  Decreased Interest 0 3  Down, Depressed, Hopeless 0 1  PHQ - 2 Score 0 4  Altered sleeping 0 1  Tired, decreased energy 3 3  Change in appetite 0 1  Feeling bad or failure about yourself  0 3  Trouble concentrating 0 0  Moving slowly or fidgety/restless 0 0  Suicidal thoughts 0 0  PHQ-9 Score 3 12  Difficult doing work/chores Not difficult at all Very difficult    Assessment and plan:  Encounter for  medical examination to establish care - Patient interested in tobacco cessation, referred to Dr. Raymondo Band for a designated tobacco cessation visit - PMH, PSH, SH, FH reviewed with patient - patient to schedule follow up to discuss anxiety/potential controller medications other than xanax. She understands we do not prescribe controlled substances at the first visit and that we may try other medications in the future other than xanax  Howard Pouch, MD,MS,  PGY2 12/07/2017 9:21 AM

## 2017-12-16 ENCOUNTER — Ambulatory Visit: Payer: Medicaid Other | Admitting: Student in an Organized Health Care Education/Training Program

## 2018-01-14 ENCOUNTER — Emergency Department (HOSPITAL_COMMUNITY)
Admission: EM | Admit: 2018-01-14 | Discharge: 2018-01-14 | Disposition: A | Discharge status: Home / Self Care | Payer: Medicaid Other | Attending: Emergency Medicine | Admitting: Emergency Medicine

## 2018-01-14 ENCOUNTER — Other Ambulatory Visit: Payer: Self-pay

## 2018-01-14 ENCOUNTER — Encounter (HOSPITAL_COMMUNITY): Payer: Self-pay

## 2018-01-14 DIAGNOSIS — R1010 Upper abdominal pain, unspecified: Secondary | ICD-10-CM | POA: Diagnosis not present

## 2018-01-14 DIAGNOSIS — R200 Anesthesia of skin: Secondary | ICD-10-CM | POA: Insufficient documentation

## 2018-01-14 DIAGNOSIS — M545 Low back pain: Secondary | ICD-10-CM | POA: Diagnosis present

## 2018-01-14 DIAGNOSIS — Z5321 Procedure and treatment not carried out due to patient leaving prior to being seen by health care provider: Secondary | ICD-10-CM | POA: Diagnosis not present

## 2018-01-14 LAB — COMPREHENSIVE METABOLIC PANEL
ALBUMIN: 3.9 g/dL (ref 3.5–5.0)
ALT: 15 U/L (ref 14–54)
ANION GAP: 8 (ref 5–15)
AST: 18 U/L (ref 15–41)
Alkaline Phosphatase: 83 U/L (ref 38–126)
BUN: 17 mg/dL (ref 6–20)
CHLORIDE: 105 mmol/L (ref 101–111)
CO2: 25 mmol/L (ref 22–32)
Calcium: 9.3 mg/dL (ref 8.9–10.3)
Creatinine, Ser: 0.63 mg/dL (ref 0.44–1.00)
GFR calc Af Amer: >60 mL/min (ref >60)
GFR calc non Af Amer: >60 mL/min (ref >60)
GLUCOSE: 100 mg/dL — AB (ref 65–99)
POTASSIUM: 4.1 mmol/L (ref 3.5–5.1)
SODIUM: 138 mmol/L (ref 135–145)
Total Bilirubin: 0.5 mg/dL (ref 0.3–1.2)
Total Protein: 6.6 g/dL (ref 6.5–8.1)

## 2018-01-14 LAB — CBC
HEMATOCRIT: 42.7 % (ref 36.0–46.0)
HEMOGLOBIN: 14.2 g/dL (ref 12.0–15.0)
MCH: 31.4 pg (ref 26.0–34.0)
MCHC: 33.3 g/dL (ref 30.0–36.0)
MCV: 94.5 fL (ref 78.0–100.0)
Platelets: 190 10*3/uL (ref 150–400)
RBC: 4.52 MIL/uL (ref 3.87–5.11)
RDW: 12.6 % (ref 11.5–15.5)
WBC: 8.9 10*3/uL (ref 4.0–10.5)

## 2018-01-14 LAB — URINALYSIS, ROUTINE W REFLEX MICROSCOPIC
BACTERIA UA: NONE SEEN
BILIRUBIN URINE: NEGATIVE
Glucose, UA: NEGATIVE mg/dL
Ketones, ur: NEGATIVE mg/dL
LEUKOCYTES UA: NEGATIVE
NITRITE: NEGATIVE
PROTEIN: NEGATIVE mg/dL
SPECIFIC GRAVITY, URINE: 1.024 (ref 1.005–1.030)
pH: 5 (ref 5.0–8.0)

## 2018-01-14 LAB — LIPASE, BLOOD: LIPASE: 34 U/L (ref 11–51)

## 2018-01-14 LAB — I-STAT BETA HCG BLOOD, ED (MC, WL, AP ONLY)

## 2018-01-14 NOTE — ED Triage Notes (Signed)
Pt states that she has been having lower back pain for months that shoots down her R leg, supposed to have MRI of back last Friday but she had to cancel. Pain is worse today, reports bulging disk. Pt report that L sided tingling from the knee down is new since 5AM, neuro intact, pt ambulatory. Pt reports upper abd pain that started at 5am today with nausea. Denies loss of bowel or bladder

## 2018-03-03 ENCOUNTER — Emergency Department (HOSPITAL_COMMUNITY)
Admission: EM | Admit: 2018-03-03 | Discharge: 2018-03-03 | Discharge status: Against Medical Advice | Payer: Medicaid Other

## 2018-03-03 ENCOUNTER — Other Ambulatory Visit: Payer: Self-pay

## 2018-03-03 NOTE — ED Notes (Signed)
No answer for triage.

## 2018-03-03 NOTE — ED Notes (Signed)
Pt called in waiting room no answer 

## 2020-08-01 ENCOUNTER — Other Ambulatory Visit: Payer: Self-pay

## 2020-08-01 ENCOUNTER — Ambulatory Visit
Admission: EM | Admit: 2020-08-01 | Discharge: 2020-08-01 | Disposition: A | Discharge status: Home / Self Care | Payer: Medicaid Other | Attending: Family Medicine | Admitting: Family Medicine

## 2020-08-01 DIAGNOSIS — H66001 Acute suppurative otitis media without spontaneous rupture of ear drum, right ear: Secondary | ICD-10-CM

## 2020-08-01 DIAGNOSIS — J0140 Acute pansinusitis, unspecified: Secondary | ICD-10-CM

## 2020-08-01 DIAGNOSIS — R5383 Other fatigue: Secondary | ICD-10-CM

## 2020-08-01 DIAGNOSIS — Z1152 Encounter for screening for COVID-19: Secondary | ICD-10-CM

## 2020-08-01 DIAGNOSIS — J069 Acute upper respiratory infection, unspecified: Secondary | ICD-10-CM

## 2020-08-01 DIAGNOSIS — R519 Headache, unspecified: Secondary | ICD-10-CM

## 2020-08-01 DIAGNOSIS — R059 Cough, unspecified: Secondary | ICD-10-CM

## 2020-08-01 MED ORDER — AMOXICILLIN-POT CLAVULANATE 875-125 MG PO TABS: 1.0000 | ORAL_TABLET | Freq: Two times a day (BID) | ORAL | 0 refills | Status: AC

## 2020-08-01 NOTE — Discharge Instructions (Addendum)
I have sent in Augmentin for you to take twice a day for 7 days for sinusitis and R ear infection  Follow up with this office or with primary care if symptoms are persisting.  Follow up in the ER for high fever, trouble swallowing, trouble breathing, other concerning symptoms.

## 2020-08-01 NOTE — ED Provider Notes (Signed)
[facility]   [id number] 08/01/20 Arrival Time: 1121   CC: COVID symptoms  SUBJECTIVE: History from: patient.  Pamela Bailey is a 39 y.o. female who presents with abrupt onset of nasal congestion, PND, and persistent dry cough for the last week and a half. Reports sinus pain and pressure for the last 4 days. Both of her children are sick at this time. Denies sick exposure to COVID, flu or strep. Denies recent travel. Has negtive history of Covid. Has not completed Covid vaccines. Has not taken OTC medications for this. There are no aggravating or alleviating factors. Reports previous symptoms in the past. Denies fever, chills, rhinorrhea, SOB, wheezing, chest pain, nausea, changes in bowel or bladder habits.    ROS: As per HPI.  All other pertinent ROS negative.     Past Medical History:  Diagnosis Date   Abnormal Pap smear    colpo 5/09   Anxiety    Asthma    Cleft hard palate    Cleft lip    Depression    Endometriosis    Facial palsy    Headache(784.0)    Ovarian cyst    PONV (postoperative nausea and vomiting)    Past Surgical History:  Procedure Laterality Date   ABDOMINAL HYSTERECTOMY     ABDOMINAL SURGERY     COSMETIC SURGERY     LAPAROSCOPY     NO PAST SURGERIES     SALPINGECTOMY     TYMPANOSTOMY TUBE PLACEMENT     Allergies  Allergen Reactions   Azithromycin Other (See Comments)    Caused renal failure (On 11/19/16, patient states this did NOT??)   Vancomycin Other (See Comments)    Acute kidney injury    Oxycodone Nausea And Vomiting   Amoxicillin-Pot Clavulanate Nausea And Vomiting, Rash and Other (See Comments)    Has patient had a PCN reaction causing immediate rash, facial/tongue/throat swelling, SOB or lightheadedness with hypotension: Yes Has patient had a PCN reaction causing severe rash involving mucus membranes or skin necrosis: No Has patient had a PCN reaction that required hospitalization No Has patient had  a PCN reaction occurring within the last 10 years: No If all of the above answers are "NO", then may proceed with Cephalosporin use.    No current facility-administered medications on file prior to encounter.   Current Outpatient Medications on File Prior to Encounter  Medication Sig Dispense Refill   ALPRAZolam (XANAX) 1 MG tablet Take 1 mg by mouth 3 (three) times daily as needed for anxiety.      dicyclomine (BENTYL) 20 MG tablet Take 1 tablet (20 mg total) by mouth 2 (two) times daily. 20 tablet 0   HYDROcodone-acetaminophen (NORCO/VICODIN) 5-325 MG tablet Take 1 tablet by mouth every 6 (six) hours as needed. 6 tablet 0   ibuprofen (ADVIL,MOTRIN) 100 MG/5ML suspension Take 600 mg by mouth every 4 (four) hours as needed.     ondansetron (ZOFRAN ODT) 4 MG disintegrating tablet Take 1 tablet (4 mg total) by mouth every 8 (eight) hours as needed for nausea or vomiting. 20 tablet 0   ondansetron (ZOFRAN) 8 MG tablet Take 1 tablet (8 mg total) by mouth every 8 (eight) hours as needed for nausea or vomiting. 20 tablet 0   Social History   Socioeconomic History   Marital status: Single    Spouse name: Not on file   Number of children: Not on file   Years of education: Not on file   Highest education level:  Not on file  Occupational History   Not on file  Tobacco Use   Smoking status: Current Every Day Smoker    Packs/day: 0.50    Years: 8.00    Pack years: 4.00    Types: Cigarettes   Smokeless tobacco: Never Used  Substance and Sexual Activity   Alcohol use: Yes   Drug use: No   Sexual activity: Yes    Birth control/protection: None  Other Topics Concern   Not on file  Social History Narrative   Drinks about 1 cup of coffee a day    Social Determinants of Health   Financial Resource Strain:    Difficulty of Paying Living Expenses: Not on file  Food Insecurity:    Worried About Programme researcher, broadcasting/film/video in the Last Year: Not on file   [employer] of Food in the  Last Year: Not on file  Transportation Needs:    Lack of Transportation (Medical): Not on file   Lack of Transportation (Non-Medical): Not on file  Physical Activity:    Days of Exercise per Week: Not on file   Minutes of Exercise per Session: Not on file  Stress:    Feeling of Stress : Not on file  Social Connections:    Frequency of Communication with Friends and Family: Not on file   Frequency of Social Gatherings with Friends and Family: Not on file   Attends Religious Services: Not on file   Active Member of Clubs or Organizations: Not on file   Attends Banker Meetings: Not on file   Marital Status: Not on file  Intimate Partner Violence:    Fear of Current or Ex-Partner: Not on file   Emotionally Abused: Not on file   Physically Abused: Not on file   Sexually Abused: Not on file   Family History  Problem Relation Age of Onset   Hypertension Maternal Grandfather    Stroke Father    Anesthesia problems Neg Hx     OBJECTIVE:  Vitals:   08/01/20 1128  BP: 130/81  Pulse: 93  Resp: 16  Temp: 98.7 F (37.1 C)  TempSrc: Oral  SpO2: 96%  Weight: 85 lb (38.6 kg)  Height: 5\' 1"  (1.549 m)     General appearance: alert; appears fatigued, but nontoxic; speaking in full sentences and tolerating own secretions HEENT: NCAT; Ears: EACs clear, L TMs erythematous, no effusion, R TM erythematous, bulging with effusion; Eyes: PERRL.  EOM grossly intact. Sinuses: frontal and maxillary sinuses tender; Nose: nares patent without rhinorrhea, Throat: oropharynx erythematous, cobblestoning present, tonsils non erythematous or enlarged, uvula midline  Neck: supple with LAD Lungs: unlabored respirations, symmetrical air entry; cough: mild; no respiratory distress; CTAB Heart: regular rate and rhythm.  Radial pulses 2+ symmetrical bilaterally Skin: warm and dry Psychological: alert and cooperative; normal mood and affect  LABS:  No results found for this  or any previous visit (from the past 24 hour(s)).   ASSESSMENT & PLAN:  1. Non-recurrent acute suppurative otitis media of right ear without spontaneous rupture of tympanic membrane   2. Encounter for screening for COVID-19   3. Upper respiratory tract infection, unspecified type   4. Acute non-recurrent pansinusitis   5. Cough   6. Other fatigue   7. Nonintractable headache, unspecified chronicity pattern, unspecified headache type     Meds ordered this encounter  Medications   amoxicillin-clavulanate (AUGMENTIN) 875-125 MG tablet    Sig: Take 1 tablet by mouth 2 (two) times daily  for 10 days.    Dispense:  20 tablet    Refill:  0    Order Specific Question:   Supervising Provider    Answer:   Merrilee Jansky [id number]   Augmentin prescribed to cover R OM and sinusitis Pt states that she tolerates this medication well and that the only medication that she has issues with is Vancomycin   COVID/Flu/RSV testing ordered.  It will take between 1-2 days for test results.  Someone will contact you regarding abnormal results.   Work note provided Patient should remain in quarantine until they have received Covid results.  If negative you may resume normal activities (go back to work/school) while practicing hand hygiene, social distance, and mask wearing.  If positive, patient should remain in quarantine for 10 days from symptom onset AND greater than 72 hours after symptoms resolution (absence of fever without the use of fever-reducing medication and improvement in respiratory symptoms), whichever is longer Get plenty of rest and push fluids Use OTC zyrtec for nasal congestion, runny nose, and/or sore throat Use OTC flonase for nasal congestion and runny nose Use medications daily for symptom relief Use OTC medications like ibuprofen or tylenol as needed fever or pain Call or go to the ED if you have any new or worsening symptoms such as fever, worsening cough, shortness of breath,  chest tightness, chest pain, turning blue, changes in mental status.  Reviewed expectations re: course of current medical issues. Questions answered. Outlined signs and symptoms indicating need for more acute intervention. Patient verbalized understanding. After Visit Summary given.         Moshe Cipro, NP 08/01/20 1156

## 2020-08-01 NOTE — ED Triage Notes (Signed)
Pt reports having nasal congestion and headache x4 days. No known covid exposure within last 14 days.

## 2020-08-03 LAB — COVID-19, FLU A+B AND RSV
Influenza A, NAA: NOT DETECTED
Influenza B, NAA: NOT DETECTED
RSV, NAA: NOT DETECTED
SARS-CoV-2, NAA: NOT DETECTED

## 2020-08-07 ENCOUNTER — Ambulatory Visit: Payer: Medicaid Other

## 2020-08-07 ENCOUNTER — Ambulatory Visit (INDEPENDENT_AMBULATORY_CARE_PROVIDER_SITE_OTHER): Payer: Medicaid Other

## 2020-08-07 ENCOUNTER — Ambulatory Visit
Admission: EM | Admit: 2020-08-07 | Discharge: 2020-08-07 | Disposition: A | Discharge status: Home / Self Care | Payer: Medicaid Other | Attending: Family Medicine | Admitting: Family Medicine

## 2020-08-07 DIAGNOSIS — S8391XA Sprain of unspecified site of right knee, initial encounter: Secondary | ICD-10-CM

## 2020-08-07 DIAGNOSIS — W1830XA Fall on same level, unspecified, initial encounter: Secondary | ICD-10-CM

## 2020-08-07 DIAGNOSIS — S86911A Strain of unspecified muscle(s) and tendon(s) at lower leg level, right leg, initial encounter: Secondary | ICD-10-CM

## 2020-08-07 NOTE — ED Notes (Addendum)
Knee brace applied and gave crutches w/ instructions.

## 2020-08-07 NOTE — Discharge Instructions (Signed)
X ray normal Wear the sleeve and use crutches to be non weight bearing.  Rest, Ice and elevate.  Follow up as needed for continued or worsening symptoms

## 2020-08-07 NOTE — ED Triage Notes (Signed)
Pt presents to Urgent Care with c/o R knee pain following a fall 2 days ago. Small skin abrasion noted to R knee cap.

## 2020-08-08 NOTE — ED Provider Notes (Signed)
[facility]    CSN: [id number] Arrival date & time: 08/07/20  1418      History   Chief Complaint Chief Complaint  Patient presents with  . Knee Injury    HPI Pamela Bailey is a 39 y.o. female.   Patient is a 39 year old female presents today with right knee injury.  Reports she fell 2 days ago.  Has small abrasion to knee mild swelling.  Pain with ambulation.     Past Medical History:  Diagnosis Date  . Abnormal Pap smear    colpo 5/09  . Anxiety   . Asthma   . Cleft hard palate   . Cleft lip   . Depression   . Endometriosis   . Facial palsy   . Headache(784.0)   . Ovarian cyst   . PONV (postoperative nausea and vomiting)     Patient Active Problem List   Diagnosis Date Noted  . Encounter for medical examination to establish care 12/07/2017  . Chronic midline low back pain without sciatica 09/09/2016  . Weakness 06/21/2016  . Left leg numbness 06/21/2016  . Numbness of upper extremity 06/21/2016  . Weakness of upper extremity 06/21/2016  . Arm weakness 06/21/2016  . Bilateral arm weakness   . Hypokalemia   . Leukocytosis   . 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 Chronic fatigue 07/16/2015  . Anxiety 06/04/2015  . Headache 08/30/2008  . NAUSEA 08/30/2008  . ABDOMINAL PAIN-MULTIPLE SITES 08/30/2008  . RENAL FAILURE, ACUTE 08/29/2008    Past Surgical History:  Procedure Laterality Date  . ABDOMINAL HYSTERECTOMY    . ABDOMINAL SURGERY    . COSMETIC SURGERY    . LAPAROSCOPY    . NO PAST SURGERIES    . SALPINGECTOMY    . TYMPANOSTOMY TUBE PLACEMENT      OB History    Gravida  2   Para  2   Term  2   Preterm      AB      Living  2     SAB      TAB      Ectopic      Multiple      Live Births  2            Home Medications    Prior to Admission medications   Medication Sig Start Date End Date Taking? Authorizing Provider  ALPRAZolam Prudy Feeler) 1 MG tablet Take 1 mg by mouth 3 (three) times daily as needed for anxiety.      [provider]  amoxicillin-clavulanate (AUGMENTIN) 875-125 MG tablet Take 1 tablet by mouth 2 (two) times daily for 10 days. 08/01/20 08/11/20  Moshe Cipro, NP  dicyclomine (BENTYL) 20 MG tablet Take 1 tablet (20 mg total) by mouth 2 (two) times daily. 04/20/17   Mackuen, Courteney Lyn, MD  HYDROcodone-acetaminophen (NORCO/VICODIN) 5-325 MG tablet Take 1 tablet by mouth every 6 (six) hours as needed. 04/20/17   Mackuen, Courteney Lyn, MD  ibuprofen (ADVIL,MOTRIN) 100 MG/5ML suspension Take 600 mg by mouth every 4 (four) hours as needed.    [provider]  ondansetron (ZOFRAN ODT) 4 MG disintegrating tablet Take 1 tablet (4 mg total) by mouth every 8 (eight) hours as needed for nausea or vomiting. 04/20/17   Mackuen, Courteney Lyn, MD  ondansetron (ZOFRAN) 8 MG tablet Take 1 tablet (8 mg total) by mouth every 8 (eight) hours as needed for nausea or vomiting. 11/19/16   [name], MD    Family  History Family History  Problem Relation Age of Onset  . Chronic fatigue Mother   . Hypertension Maternal Grandfather   . Stroke Father   . Anesthesia problems Neg Hx     Social History Social History   Tobacco Use  . Smoking status: Current Every Day Smoker    Packs/day: 0.50    Years: 8.00    Pack years: 4.00    Types: Cigarettes  . Smokeless tobacco: Never Used  Substance Use Topics  . Alcohol use: Yes    Comment: 1-2 drinks per week  . Drug use: No     Allergies   Azithromycin, Vancomycin, Oxycodone, and Amoxicillin-pot clavulanate   Review of Systems Review of Systems   Physical Exam Triage Vital Signs ED Triage Vitals  Enc Vitals Group     BP 08/07/20 1450 112/74     Pulse Rate 08/07/20 1450 80     Resp 08/07/20 1450 18     Temp 08/07/20 1450 98.5 F (36.9 C)     Temp Source 08/07/20 1450 Temporal     SpO2 08/07/20 1450 96 %     Weight 08/07/20 1445 85 lb (38.6 kg)     Height --      Head Circumference --      Peak Flow --      Pain  Score 08/07/20 1445 7     Pain Loc --      Pain Edu? --      Excl. in GC? --    No data found.  Updated Vital Signs BP 112/74 (BP Location: Left Arm)   Pulse 80   Temp 98.5 F (36.9 C) (Temporal)   Resp 18   Wt 85 lb (38.6 kg)   LMP 06/03/2014   SpO2 96%   BMI 16.06 kg/m   Visual Acuity Right Eye Distance:   Left Eye Distance:   Bilateral Distance:    Right Eye Near:   Left Eye Near:    Bilateral Near:     Physical Exam Vitals and nursing note reviewed.  Constitutional:      General: She is not in acute distress.    Appearance: Normal appearance. She is not ill-appearing, toxic-appearing or diaphoretic.  HENT:     Head: Normocephalic.  Eyes:     Conjunctiva/sclera: Conjunctivae normal.  Pulmonary:     Effort: Pulmonary effort is normal.  Musculoskeletal:        General: Normal range of motion.     Cervical back: Normal range of motion.       Legs:     Comments: Generalized pain, abrasion, mild swelling.   Skin:    General: Skin is warm and dry.     Findings: No rash.  Neurological:     Mental Status: She is alert.  Psychiatric:        Mood and Affect: Mood normal.      UC Treatments / Results  Labs (all labs ordered are listed, but only abnormal results are displayed) Labs Reviewed - No data to display  EKG   Radiology DG Knee Complete 4 Views Right  Result Date: 08/07/2020 CLINICAL DATA:  Knee pain after fall 2 days ago EXAM: RIGHT KNEE - COMPLETE 4+ VIEW COMPARISON:  None. FINDINGS: No evidence of fracture, dislocation, or joint effusion. No evidence of arthropathy or other focal bone abnormality. No joint effusion. IMPRESSION: Negative right knee radiographs Electronically Signed   By: Maudry Mayhew MD   On: 08/07/2020 15:27  Procedures Procedures (including critical care time)  Medications Ordered in UC Medications - No data to display  Initial Impression / Assessment and Plan / UC Course  I have reviewed the triage vital signs and  the nursing notes.  Pertinent labs & imaging results that were available during my care of the patient were reviewed by me and considered in my medical decision making (see chart for details).     Knee strain No acute findings on x ray Most likely knee strain Knee sleeve applied  crutches given as requested.  Rest, Ice, elevate.  Follow up as needed for continued or worsening symptoms   Final Clinical Impressions(s) / UC Diagnoses   Final diagnoses:  Strain of right knee, initial encounter     Discharge Instructions     X ray normal Wear the sleeve and use crutches to be non weight bearing.  Rest, Ice and elevate.  Follow up as needed for continued or worsening symptoms     ED Prescriptions    None     PDMP not reviewed this encounter.   Janace Aris, NP 08/08/20 1102

## 2020-09-22 ENCOUNTER — Ambulatory Visit
Admission: EM | Admit: 2020-09-22 | Discharge: 2020-09-22 | Disposition: A | Discharge status: Home / Self Care | Payer: Medicaid Other | Attending: Family Medicine | Admitting: Family Medicine

## 2020-09-22 DIAGNOSIS — Z20822 Contact with and (suspected) exposure to covid-19: Secondary | ICD-10-CM | POA: Diagnosis not present

## 2020-09-22 NOTE — Discharge Instructions (Addendum)
Naproxen for body aches and headache. If positive quarantine for a total of 10 days. Covid test will result within 3-5 business days directly to your Mychart.  Our office will only call on positive results.

## 2020-09-22 NOTE — ED Provider Notes (Signed)
Pamela Bailey    CSN: [id number] Arrival date & time: 09/22/20  1027      History   Chief Complaint Chief Complaint  Patient presents with  . Nasal Congestion    HPI Pamela Bailey is a 40 y.o. female.   HPI  Patient presents with URI symptoms including cough, body aches, nasal congestion, and sinus pressure. Known COVID exposure x 3 days ago. Current symptoms 3-4 days ago. Denies worrisome symptoms of shortness of breath, weakness, N&V, or  chest pain.  Past Medical History:  Diagnosis Date  . Abnormal Pap smear    colpo 5/09  . Anxiety   . Asthma   . Cleft hard palate   . Cleft lip   . Depression   . Endometriosis   . Facial palsy   . Headache(784.0)   . Ovarian cyst   . PONV (postoperative nausea and vomiting)     Patient Active Problem List   Diagnosis Date Noted  . Encounter for medical examination to establish care 12/07/2017  . Chronic midline low back pain without sciatica 09/09/2016  . Weakness 06/21/2016  . Left leg numbness 06/21/2016  . Numbness of upper extremity 06/21/2016  . Weakness of upper extremity 06/21/2016  . Arm weakness 06/21/2016  . Bilateral arm weakness   . Hypokalemia   . Leukocytosis   . 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 Chronic fatigue 07/16/2015  . Anxiety 06/04/2015  . Headache 08/30/2008  . NAUSEA 08/30/2008  . ABDOMINAL PAIN-MULTIPLE SITES 08/30/2008  . RENAL FAILURE, ACUTE 08/29/2008    Past Surgical History:  Procedure Laterality Date  . ABDOMINAL HYSTERECTOMY    . ABDOMINAL SURGERY    . COSMETIC SURGERY    . LAPAROSCOPY    . NO PAST SURGERIES    . SALPINGECTOMY    . TYMPANOSTOMY TUBE PLACEMENT      OB History    Gravida  2   Para  2   Term  2   Preterm      AB      Living  2     SAB      IAB      Ectopic      Multiple      Live Births  2            Home Medications    Prior to Admission medications   Medication Sig Start Date End Date Taking? Authorizing Provider  ALPRAZolam  Prudy Feeler) 1 MG tablet Take 1 mg by mouth 3 (three) times daily as needed for anxiety.     [provider]  dicyclomine (BENTYL) 20 MG tablet Take 1 tablet (20 mg total) by mouth 2 (two) times daily. 04/20/17   Mackuen, Courteney Lyn, MD  HYDROcodone-acetaminophen (NORCO/VICODIN) 5-325 MG tablet Take 1 tablet by mouth every 6 (six) hours as needed. 04/20/17   Mackuen, Courteney Lyn, MD  ibuprofen (ADVIL,MOTRIN) 100 MG/5ML suspension Take 600 mg by mouth every 4 (four) hours as needed.    [provider]  ondansetron (ZOFRAN ODT) 4 MG disintegrating tablet Take 1 tablet (4 mg total) by mouth every 8 (eight) hours as needed for nausea or vomiting. 04/20/17   Mackuen, Courteney Lyn, MD  ondansetron (ZOFRAN) 8 MG tablet Take 1 tablet (8 mg total) by mouth every 8 (eight) hours as needed for nausea or vomiting. 11/19/16   [name], MD    Family History Family History  Problem Relation Age of Onset  . Chronic fatigue Mother   .  Hypertension Maternal Grandfather   . Stroke Father   . Anesthesia problems Neg Hx     Social History Social History   Tobacco Use  . Smoking status: Current Every Day Smoker    Packs/day: 0.50    Years: 8.00    Pack years: 4.00    Types: Cigarettes  . Smokeless tobacco: Never Used  Substance Use Topics  . Alcohol use: Yes    Comment: 1-2 drinks per week  . Drug use: No     Allergies   Azithromycin, Vancomycin, Oxycodone, and Amoxicillin-pot clavulanate   Review of Systems Review of Systems Pertinent negatives listed in HPI  Physical Exam Triage Vital Signs ED Triage Vitals  Enc Vitals Group     BP 09/22/20 1126 117/76     Pulse Rate 09/22/20 1126 91     Resp 09/22/20 1126 18     Temp 09/22/20 1126 98.4 F (36.9 C)     Temp Source 09/22/20 1126 Oral     SpO2 09/22/20 1126 97 %     Weight --      Height --      Head Circumference --      Peak Flow --      Pain Score 09/22/20 1137 6     Pain Loc --      Pain Edu? --      Excl.  in GC? --    No data found.  Updated Vital Signs BP 117/76   Pulse 91   Temp 98.4 F (36.9 C) (Oral)   Resp 18   LMP 06/03/2014   SpO2 97%   Visual Acuity Right Eye Distance:   Left Eye Distance:   Bilateral Distance:    Right Eye Near:   Left Eye Near:    Bilateral Near:     Physical Exam  General Appearance:    Alert, cooperative, no distress  HENT:   Normocephalic, ears normal, nares mucosal edema with congestion, rhinorrhea, oropharynx    Eyes:    PERRL, conjunctiva/corneas clear, EOM's intact       Lungs:     Clear to auscultation bilaterally, respirations unlabored  Heart:    Regular rate and rhythm  Neurologic:   Awake, alert, oriented x 3. No apparent focal neurological           defect.      UC Treatments / Results  Labs (all labs ordered are listed, but only abnormal results are displayed) Labs Reviewed - No data to display  EKG   Radiology No results found.  Procedures Procedures (including critical care time)  Medications Ordered in UC Medications - No data to display  Initial Impression / Assessment and Plan / UC Course  I have reviewed the triage vital signs and the nursing notes.  Pertinent labs & imaging results that were available during my care of the patient were reviewed by me and considered in my medical decision making (see chart for details).    COVID/Flu test pending. Symptom management warranted only.  Manage fever with Tylenol and ibuprofen.  Nasal symptoms with over-the-counter antihistamines recommended.  Treatment per discharge medications/discharge instructions.  Red flags/ER precautions given. The most current CDC isolation/quarantine recommendation advised.  Final Clinical Impressions(s) / UC Diagnoses   Final diagnoses:  Close exposure to COVID-19 virus     Discharge Instructions     Naproxen for body aches and headache. If positive quarantine for a total of 10 days. Covid test will result within 3-5 business days  directly to your Mychart.  Our office will only call on positive results.    ED Prescriptions    None     PDMP not reviewed this encounter.   Scot Jun, Parks 09/28/20 [id number]

## 2020-09-22 NOTE — ED Triage Notes (Signed)
Pt reports having nasal congestion, body aches, headache and sinus pressure x3-4 days. Also reports having a covid exposure 3 days ago.

## 2020-09-25 LAB — COVID-19, FLU A+B NAA
Influenza A, NAA: NOT DETECTED
Influenza B, NAA: NOT DETECTED
SARS-CoV-2, NAA: NOT DETECTED

## 2020-10-29 ENCOUNTER — Encounter: Payer: Medicaid Other | Admitting: Obstetrics

## 2020-10-31 ENCOUNTER — Ambulatory Visit
Admission: EM | Admit: 2020-10-31 | Discharge: 2020-10-31 | Disposition: A | Discharge status: Home / Self Care | Payer: Medicaid Other | Attending: Family Medicine | Admitting: Family Medicine

## 2020-10-31 ENCOUNTER — Other Ambulatory Visit: Payer: Self-pay

## 2020-10-31 ENCOUNTER — Encounter: Payer: Self-pay | Admitting: Emergency Medicine

## 2020-10-31 DIAGNOSIS — Z1152 Encounter for screening for COVID-19: Secondary | ICD-10-CM

## 2020-10-31 NOTE — Discharge Instructions (Addendum)
Covid test pending  OTC medicines as needed.  Follow up as needed for continued or worsening symptoms  

## 2020-10-31 NOTE — ED Triage Notes (Signed)
Pt presents with nasal congestion, sore throat and low grade x 2 days. She has exposure to Covid, daughter tested positive this week.

## 2020-11-02 LAB — SARS-COV-2, NAA 2 DAY TAT

## 2020-11-02 LAB — NOVEL CORONAVIRUS, NAA: SARS-CoV-2, NAA: NOT DETECTED

## 2020-11-02 NOTE — ED Provider Notes (Signed)
[name]    CSN: [id number] Arrival date & time: 10/31/20  1696      History   Chief Complaint Chief Complaint  Patient presents with  . Sore Throat  . Nasal Congestion    HPI Pamela Bailey is a 40 y.o. female.   Patient is a 40 year old female presents today with nasal congestion, sore throat, low-grade fever for 2 days.  Positive COVID exposure from daughter.      Past Medical History:  Diagnosis Date  . Abnormal Pap smear    colpo 5/09  . Anxiety   . Asthma   . Cleft hard palate   . Cleft lip   . Depression   . Endometriosis   . Facial palsy   . Headache(784.0)   . Ovarian cyst   . PONV (postoperative nausea and vomiting)     Patient Active Problem List   Diagnosis Date Noted  . Encounter for medical examination to establish care 12/07/2017  . Chronic midline low back pain without sciatica 09/09/2016  . Weakness 06/21/2016  . Left leg numbness 06/21/2016  . Numbness of upper extremity 06/21/2016  . Weakness of upper extremity 06/21/2016  . Arm weakness 06/21/2016  . Bilateral arm weakness   . Hypokalemia   . Leukocytosis   . 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 Chronic fatigue 07/16/2015  . Anxiety 06/04/2015  . Headache 08/30/2008  . NAUSEA 08/30/2008  . ABDOMINAL PAIN-MULTIPLE SITES 08/30/2008  . RENAL FAILURE, ACUTE 08/29/2008    Past Surgical History:  Procedure Laterality Date  . ABDOMINAL HYSTERECTOMY    . ABDOMINAL SURGERY    . COSMETIC SURGERY    . LAPAROSCOPY    . NO PAST SURGERIES    . SALPINGECTOMY    . TYMPANOSTOMY TUBE PLACEMENT      OB History    Gravida  2   Para  2   Term  2   Preterm      AB      Living  2     SAB      IAB      Ectopic      Multiple      Live Births  2            Home Medications    Prior to Admission medications   Medication Sig Start Date End Date Taking? Authorizing Provider  ALPRAZolam Prudy Feeler) 1 MG tablet Take 1 mg by mouth 3 (three) times daily as needed for  anxiety.    Yes [provider]  HYDROcodone-acetaminophen (NORCO/VICODIN) 5-325 MG tablet Take 1 tablet by mouth every 6 (six) hours as needed. 04/20/17  Yes Mackuen, Courteney Lyn, MD  ibuprofen (ADVIL,MOTRIN) 100 MG/5ML suspension Take 600 mg by mouth every 4 (four) hours as needed.   Yes [provider]  ondansetron (ZOFRAN) 8 MG tablet Take 1 tablet (8 mg total) by mouth every 8 (eight) hours as needed for nausea or vomiting. 11/19/16   [name], MD  dicyclomine (BENTYL) 20 MG tablet Take 1 tablet (20 mg total) by mouth 2 (two) times daily. 04/20/17 10/31/20  Mackuen, Cindee Salt, MD    Family History Family History  Problem Relation Age of Onset  . Chronic fatigue Mother   . Hypertension Maternal Grandfather   . Stroke Father   . Anesthesia problems Neg Hx     Social History Social History   Tobacco Use  . Smoking status: Current Every Day Smoker    Packs/day: 0.50  Years: 8.00    Pack years: 4.00    Types: Cigarettes  . Smokeless tobacco: Never Used  Substance Use Topics  . Alcohol use: Yes    Comment: 1-2 drinks per week  . Drug use: No     Allergies   Azithromycin, Vancomycin, Oxycodone, and Amoxicillin-pot clavulanate   Review of Systems Review of Systems   Physical Exam Triage Vital Signs ED Triage Vitals  Enc Vitals Group     BP 10/31/20 0925 97/74     Pulse Rate 10/31/20 0925 84     Resp 10/31/20 0925 18     Temp 10/31/20 0925 98.3 F (36.8 C)     Temp Source 10/31/20 0925 Oral     SpO2 10/31/20 0925 95 %     Weight 10/31/20 0927 92 lb (41.7 kg)     Height --      Head Circumference --      Peak Flow --      Pain Score 10/31/20 0926 0     Pain Loc --      Pain Edu? --      Excl. in GC? --    No data found.  Updated Vital Signs BP 97/74 (BP Location: Left Arm)   Pulse 84   Temp 98.3 F (36.8 C) (Oral)   Resp 18   Wt 92 lb (41.7 kg)   LMP 06/03/2014   SpO2 95%   BMI 17.38 kg/m   Visual Acuity Right Eye  Distance:   Left Eye Distance:   Bilateral Distance:    Right Eye Near:   Left Eye Near:    Bilateral Near:     Physical Exam Vitals and nursing note reviewed.  Constitutional:      General: She is not in acute distress.    Appearance: Normal appearance. She is not ill-appearing, toxic-appearing or diaphoretic.  HENT:     Head: Normocephalic.  Eyes:     Conjunctiva/sclera: Conjunctivae normal.  Pulmonary:     Effort: Pulmonary effort is normal.  Musculoskeletal:        General: Normal range of motion.     Cervical back: Normal range of motion.  Skin:    General: Skin is warm and dry.     Findings: No rash.  Neurological:     Mental Status: She is alert.  Psychiatric:        Mood and Affect: Mood normal.      UC Treatments / Results  Labs (all labs ordered are listed, but only abnormal results are displayed) Labs Reviewed  NOVEL CORONAVIRUS, NAA   Narrative:    Performed at:  [address] [place], [place]  [id number] Lab Director: Jolene Schimke MD, Phone:  [phone]  SARS-COV-2, NAA 2 DAY TAT   Narrative:    Performed at:  [address] [place] [address] [place], [place]  [id number] Lab Director: Jolene Schimke MD, Phone:  [phone]    EKG   Radiology No results found.  Procedures Procedures (including critical care time)  Medications Ordered in UC Medications - No data to display  Initial Impression / Assessment and Plan / UC Course  I have reviewed the triage vital signs and the nursing notes.  Pertinent labs & imaging results that were available during my care of the patient were reviewed by me and considered in my medical decision making (see chart for details).     COVID exposure Covid test pending Recommend over-the-counter medicines as needed.  Final Clinical Impressions(s) / UC Diagnoses   Final diagnoses:  Encounter for screening for COVID-19     Discharge Instructions     Covid test pending   OTC medicines as needed Follow up as needed for continued or worsening symptoms     ED Prescriptions    None     PDMP not reviewed this encounter.   Janace Aris, NP 11/02/20 1157

## 2020-11-24 ENCOUNTER — Ambulatory Visit (INDEPENDENT_AMBULATORY_CARE_PROVIDER_SITE_OTHER): Payer: Medicaid Other | Admitting: Obstetrics

## 2020-11-24 ENCOUNTER — Encounter: Payer: Self-pay | Admitting: Obstetrics

## 2020-11-24 ENCOUNTER — Other Ambulatory Visit: Payer: Self-pay

## 2020-11-24 ENCOUNTER — Other Ambulatory Visit (HOSPITAL_COMMUNITY)
Admission: RE | Admit: 2020-11-24 | Discharge: 2020-11-24 | Disposition: A | Discharge status: Home / Self Care | Payer: Medicaid Other | Source: Ambulatory Visit | Attending: Obstetrics | Admitting: Obstetrics

## 2020-11-24 VITALS — BP 90/70 | Ht 62.0 in | Wt 87.0 lb

## 2020-11-24 DIAGNOSIS — Z124 Encounter for screening for malignant neoplasm of cervix: Secondary | ICD-10-CM | POA: Diagnosis not present

## 2020-11-24 DIAGNOSIS — Z1231 Encounter for screening mammogram for malignant neoplasm of breast: Secondary | ICD-10-CM

## 2020-11-24 DIAGNOSIS — R634 Abnormal weight loss: Secondary | ICD-10-CM

## 2020-11-24 DIAGNOSIS — Z113 Encounter for screening for infections with a predominantly sexual mode of transmission: Secondary | ICD-10-CM | POA: Diagnosis not present

## 2020-11-24 DIAGNOSIS — R5382 Chronic fatigue, unspecified: Secondary | ICD-10-CM

## 2020-11-24 DIAGNOSIS — Z01419 Encounter for gynecological examination (general) (routine) without abnormal findings: Secondary | ICD-10-CM

## 2020-11-24 NOTE — Progress Notes (Signed)
Gynecology Annual Exam  PCP: Pamela Paris, NP  Chief Complaint:  Chief Complaint  Patient presents with  . Gynecologic Exam    Pelvic pain for the past week, denies uti sx    History of Present Illness: Patient is a 40 y.o. [id number] presents for annual exam. The patient has only minor comp;aints today. She is overdue for a GYN pysical, as it hs been many years since she presented to a G"YN office. She is overdue for a pap smear. Her hx is significant for a  partial hysterectomy in 2015. She is new to [place], and has no previous records to share from [facility] in [place].  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 She is engaged to Pamela Bailey, and has tow children at home.she has been treated in the past for depressions and anxiety, but stopped taking any medication, and does not receive counseling. She takes a daily vitamin and Vitamin D. Previous use of Xanax, Wellbutrin and Lexapro. She smokes 1/2 ppd.Her last mammogram was "many years ago".She also shares a hx of endometriosis.  LMP: Patient's last menstrual period was 06/03/2014.  Dysmenorrhea: not applicable   The patient is sexually active. She currently uses status post hysterectomy for contraception. She denies dyspareunia.  The patient does not perform self breast exams.  There is no notable family history of breast or ovarian cancer in her family.  The patient wears seatbelts: yes.   The patient has regular exercise: yes.    The patient reports current symptoms of depression.    Review of Systems: ROS  Past Medical History:  Patient Active Problem List   Diagnosis Date Noted  . Encounter for medical examination to establish care 12/07/2017  . Chronic midline low back pain without sciatica 09/09/2016  . Weakness 06/21/2016  . Left leg numbness 06/21/2016  . Numbness of upper extremity 06/21/2016  . Weakness of upper extremity 06/21/2016  . Arm weakness  06/21/2016  . Bilateral arm weakness   . Hypokalemia   . Leukocytosis   . 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 Chronic fatigue 07/16/2015  . Anxiety 06/04/2015  . Headache 08/30/2008    Qualifier: Diagnosis of  By: Christella Hartigan MD, Melton Alar    . NAUSEA 08/30/2008    Qualifier: Diagnosis of  By: Christella Hartigan MD, Melton Alar    . ABDOMINAL PAIN-MULTIPLE SITES 08/30/2008    Qualifier: Diagnosis of  By: Christella Hartigan MD, Melton Alar    . RENAL FAILURE, ACUTE 08/29/2008    Qualifier: Diagnosis of  By: Melvyn Neth CMA Duncan Dull), Patty       Past Surgical History:  Past Surgical History:  Procedure Laterality Date  . ABDOMINAL HYSTERECTOMY    . ABDOMINAL SURGERY    . COSMETIC SURGERY    . LAPAROSCOPY    . NO PAST SURGERIES    . SALPINGECTOMY    . TYMPANOSTOMY TUBE PLACEMENT      Gynecologic History:  Patient's last menstrual period was 06/03/2014. Contraception: status post hysterectomy Last Pap: Results were: unknown awaiting records from [facility].  Last mammogram: "many years ago" Results were: BI-RAD I  Obstetric History: [id number]  Family History:  Family History  Problem Relation Age of Onset  . Chronic fatigue Mother   . Hypertension Maternal Grandfather   . Stroke Father   . Anesthesia problems Neg Hx     Social History:  Social History   Socioeconomic History  . Marital status: Single  Spouse name: Not on file  . Number of children: Not on file  . Years of education: Not on file  . Highest education level: Not on file  Occupational History  . Not on file  Tobacco Use  . Smoking status: Current Every Day Smoker    Packs/day: 0.50    Years: 8.00    Pack years: 4.00    Types: Cigarettes  . Smokeless tobacco: Never Used  Vaping Use  . Vaping Use: Never used  Substance and Sexual Activity  . Alcohol use: Yes    Comment: 1-2 drinks per week  . Drug use: No  . Sexual activity: Yes    Birth control/protection: Surgical    Comment: Hysterectomy  Other Topics Concern  .  Not on file  Social History Narrative   Drinks about 1 cup of coffee a day    Social Determinants of Health   Financial Resource Strain: Not on file  Food Insecurity: Not on file  Transportation Needs: Not on file  Physical Activity: Not on file  Stress: Not on file  Social Connections: Not on file  Intimate Partner Violence: Not on file    Allergies:  Allergies  Allergen Reactions  . Azithromycin Other (See Comments)    Caused renal failure (On 11/19/16, patient states this did NOT??)  . Vancomycin Other (See Comments)    Acute kidney injury   . Oxycodone Nausea And Vomiting  . Amoxicillin-Pot Clavulanate Nausea And Vomiting, Rash and Other (See Comments)    Has patient had a PCN reaction causing immediate rash, facial/tongue/throat swelling, SOB or lightheadedness with hypotension: Yes Has patient had a PCN reaction causing severe rash involving mucus membranes or skin necrosis: No Has patient had a PCN reaction that required hospitalization No Has patient had a PCN reaction occurring within the last 10 years: No If all of the above answers are "NO", then may proceed with Cephalosporin use.     Medications: Prior to Admission medications   Medication Sig Start Date End Date Taking? Authorizing Provider  albuterol (VENTOLIN HFA) 108 (90 Base) MCG/ACT inhaler Inhale into the lungs.   Yes [provider]  ALPRAZolam Prudy Feeler) 1 MG tablet Take 1 mg by mouth 3 (three) times daily as needed for anxiety.    Yes [provider]  buPROPion (WELLBUTRIN SR) 100 MG 12 hr tablet Take 1 tablet by mouth daily. 01/20/18  Yes [provider]  cyclobenzaprine (FLEXERIL) 10 MG tablet Take by mouth.   Yes [provider]  escitalopram (LEXAPRO) 10 MG tablet Take 10 mg by mouth daily. 10/21/20  Yes [provider]  HYDROcodone-acetaminophen (NORCO/VICODIN) 5-325 MG tablet Take 1 tablet by mouth every 6 (six) hours as needed. 04/20/17  Yes Mackuen, Courteney  Lyn, MD  ondansetron (ZOFRAN) 8 MG tablet Take 1 tablet (8 mg total) by mouth every 8 (eight) hours as needed for nausea or vomiting. 11/19/16  Yes [name], MD  Vitamin D, Ergocalciferol, (DRISDOL) 1.25 MG (50000 UNIT) CAPS capsule Take 50,000 Units by mouth once a week. 09/29/20  Yes [provider]  dicyclomine (BENTYL) 20 MG tablet Take 1 tablet (20 mg total) by mouth 2 (two) times daily. 04/20/17 10/31/20  Mackuen, Cindee Salt, MD    Physical Exam Vitals: Blood pressure 90/70, height 5\' 2"  (1.575 m), weight 87 lb (39.5 kg), last menstrual period 06/03/2014.  General: NAD HEENT: normocephalic, anicteric Thyroid: no enlargement, no palpable nodules Pulmonary: No increased work of breathing, CTAB Cardiovascular: RRR, distal pulses  2+ Breast: Breast symmetrical, no tenderness, no palpable nodules or masses, no skin or nipple retraction present, no nipple discharge.  No axillary or supraclavicular lymphadenopathy. Abdomen: NABS, soft, non-tender, non-distended.  Umbilicus without lesions.  No hepatomegaly, splenomegaly or masses palpable. No evidence of hernia  Genitourinary:  External: Normal external female genitalia.  Normal urethral meatus, normal Bartholin's and Skene's glands.    Vagina: Normal vaginal mucosa, no evidence of prolapse.    Cervix: Grossly normal in appearance, no bleeding  Uterus: Non-enlarged, mobile, normal contour.  No CMT  Adnexa: ovaries non-enlarged, no adnexal masses  Rectal: deferred  Lymphatic: no evidence of inguinal lymphadenopathy Extremities: no edema, erythema, or tenderness Neurologic: Grossly intact Psychiatric: mood appropriate, affect full  Female chaperone present for pelvic and breast  portions of the physical exam    Assessment: 40 y.o. G2P2002 routine annual exam  Plan: Problem List Items Addressed This Visit      Other   Chronic fatigue   Relevant Orders   CBC w/Diff/Platelet   TSH   Comprehensive metabolic panel     Other Visit Diagnoses    Women's annual routine gynecological examination    -  Primary   Relevant Orders   Cytology - PAP   MM DIGITAL SCREENING BILATERAL   CBC w/Diff/Platelet   TSH   Comprehensive metabolic panel   Cervical cancer screening       Breast cancer screening by mammogram       Relevant Orders   MM DIGITAL SCREENING BILATERAL   Routine screening for STI (sexually transmitted infection)       Relevant Orders   Cytology - PAP   Weight loss, unintentional       Relevant Orders   TSH   Comprehensive metabolic panel      1) Mammogram - recommend yearly screening mammogram.  Mammogram Was ordered today   2) STI screening  wasoffered and declined  3) ASCCP guidelines and rational discussed.  Patient opts for every 5 years screening interval  4) Contraception - the patient is currently using  status post hysterectomy.  She is happy with her current form of contraception and plans to continue  5) Colonoscopy -- Screening recommended starting at age 41 for average risk individuals, age 62 for individuals deemed at increased risk (including African Americans) and recommended to continue until age 72.  For patient age 67-85 individualized approach is recommended.  Gold standard screening is via colonoscopy, Cologuard screening is an acceptable alternative for patient unwilling or unable to undergo colonoscopy.  "Colorectal cancer screening for average?risk adults: 2018 guideline update from the American Cancer Society"CA: A Cancer Journal for Clinicians: Feb 16, 2017   6) Routine healthcare maintenance including cholesterol, diabetes screening discussed managed by PCP  7) Return in about 1 year (around 11/24/2021) for annual. we need records from [facility] in [place].  As she has c/o fatigue, weight loss and difficulty keeping weight on, we will order some labs for her, including: CMP, TSH, and CBC   Pamela Bailey, CNM  11/24/2020 5:50 PM   [facility] 11/24/2020, 5:42 PM

## 2020-11-25 ENCOUNTER — Encounter: Payer: Self-pay | Admitting: Obstetrics

## 2020-11-25 LAB — COMPREHENSIVE METABOLIC PANEL
ALT: 14 IU/L (ref 0–32)
AST: 18 IU/L (ref 0–40)
Albumin/Globulin Ratio: 2.7 — ABNORMAL HIGH (ref 1.2–2.2)
Albumin: 4.6 g/dL (ref 3.8–4.8)
Alkaline Phosphatase: 88 IU/L (ref 44–121)
BUN/Creatinine Ratio: 26 — ABNORMAL HIGH (ref 9–23)
BUN: 18 mg/dL (ref 6–24)
Bilirubin Total: <0.2 mg/dL (ref 0.0–1.2)
CO2: 21 mmol/L (ref 20–29)
Calcium: 9.5 mg/dL (ref 8.7–10.2)
Chloride: 105 mmol/L (ref 96–106)
Creatinine, Ser: 0.7 mg/dL (ref 0.57–1.00)
Globulin, Total: 1.7 g/dL (ref 1.5–4.5)
Glucose: 76 mg/dL (ref 65–99)
Potassium: 4.7 mmol/L (ref 3.5–5.2)
Sodium: 142 mmol/L (ref 134–144)
Total Protein: 6.3 g/dL (ref 6.0–8.5)
eGFR: 111 mL/min/{1.73_m2} (ref >59)

## 2020-11-25 LAB — CBC WITH DIFFERENTIAL/PLATELET
Basophils Absolute: 0 10*3/uL (ref 0.0–0.2)
Basos: 0 %
EOS (ABSOLUTE): 0.3 10*3/uL (ref 0.0–0.4)
Eos: 2 %
Hematocrit: 41.4 % (ref 34.0–46.6)
Hemoglobin: 14.2 g/dL (ref 11.1–15.9)
Immature Grans (Abs): 0 10*3/uL (ref 0.0–0.1)
Immature Granulocytes: 0 %
Lymphocytes Absolute: 3.3 10*3/uL — ABNORMAL HIGH (ref 0.7–3.1)
Lymphs: 28 %
MCH: 32.9 pg (ref 26.6–33.0)
MCHC: 34.3 g/dL (ref 31.5–35.7)
MCV: 96 fL (ref 79–97)
Monocytes Absolute: 0.7 10*3/uL (ref 0.1–0.9)
Monocytes: 6 %
Neutrophils Absolute: 7.5 10*3/uL — ABNORMAL HIGH (ref 1.4–7.0)
Neutrophils: 64 %
Platelets: 223 10*3/uL (ref 150–450)
RBC: 4.31 x10E6/uL (ref 3.77–5.28)
RDW: 11.9 % (ref 11.7–15.4)
WBC: 11.9 10*3/uL — ABNORMAL HIGH (ref 3.4–10.8)

## 2020-11-25 LAB — CYTOLOGY - PAP
Chlamydia: NEGATIVE
Comment: NEGATIVE
Comment: NEGATIVE
Comment: NORMAL
Diagnosis: NEGATIVE
Neisseria Gonorrhea: NEGATIVE
Trichomonas: NEGATIVE

## 2020-11-25 LAB — TSH: TSH: 1.69 u[IU]/mL (ref 0.450–4.500)

## 2020-11-27 ENCOUNTER — Other Ambulatory Visit: Payer: Self-pay

## 2020-11-27 ENCOUNTER — Ambulatory Visit
Admission: EM | Admit: 2020-11-27 | Discharge: 2020-11-27 | Disposition: A | Discharge status: Home / Self Care | Payer: Medicaid Other | Attending: Family Medicine | Admitting: Family Medicine

## 2020-11-27 ENCOUNTER — Encounter: Payer: Self-pay | Admitting: Emergency Medicine

## 2020-11-27 DIAGNOSIS — R059 Cough, unspecified: Secondary | ICD-10-CM | POA: Diagnosis not present

## 2020-11-27 MED ORDER — BENZONATATE 100 MG PO CAPS: 100.0000 mg | ORAL_CAPSULE | Freq: Three times a day (TID) | ORAL | 0 refills | Status: DC

## 2020-11-27 MED ORDER — AZITHROMYCIN 250 MG PO TABS: 250.0000 mg | ORAL_TABLET | Freq: Every day | ORAL | 0 refills | Status: DC

## 2020-11-27 NOTE — ED Triage Notes (Signed)
Pt presents today with c/o of sore throat, cough and nasal congestion x 3 days. Denies fever.

## 2020-11-27 NOTE — ED Provider Notes (Signed)
Pamela Bailey    CSN: [id number] Arrival date & time: 11/27/20  0807      History   Chief Complaint Chief Complaint  Patient presents with  . Nasal Congestion  . Sore Throat  . Cough    HPI Pamela Bailey is a 40 y.o. female.    Cough Associated symptoms: rhinorrhea   URI Presenting symptoms: congestion, cough and rhinorrhea   Severity:  Moderate Timing:  Constant Progression:  Worsening Relieved by:  Nothing Worsened by:  Nothing Associated symptoms: sinus pain   Risk factors: sick contacts     Past Medical History:  Diagnosis Date  . Abnormal Pap smear    colpo 5/09  . Anxiety   . Asthma   . Cleft hard palate   . Cleft lip   . Depression   . Endometriosis   . Facial palsy   . Headache(784.0)   . Ovarian cyst   . PONV (postoperative nausea and vomiting)     Patient Active Problem List   Diagnosis Date Noted  . Encounter for medical examination to establish care 12/07/2017  . Chronic midline low back pain without sciatica 09/09/2016  . Weakness 06/21/2016  . Left leg numbness 06/21/2016  . Numbness of upper extremity 06/21/2016  . Weakness of upper extremity 06/21/2016  . Arm weakness 06/21/2016  . Bilateral arm weakness   . Hypokalemia   . Leukocytosis   . 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 Chronic fatigue 07/16/2015  . Anxiety 06/04/2015  . Headache 08/30/2008  . NAUSEA 08/30/2008  . ABDOMINAL PAIN-MULTIPLE SITES 08/30/2008  . RENAL FAILURE, ACUTE 08/29/2008    Past Surgical History:  Procedure Laterality Date  . ABDOMINAL HYSTERECTOMY    . ABDOMINAL SURGERY    . COSMETIC SURGERY    . LAPAROSCOPY    . NO PAST SURGERIES    . SALPINGECTOMY    . TYMPANOSTOMY TUBE PLACEMENT      OB History    Gravida  2   Para  2   Term  2   Preterm      AB      Living  2     SAB      IAB      Ectopic      Multiple      Live Births  2            Home Medications    Prior to Admission medications   Medication Sig Start  Date End Date Taking? Authorizing Provider  ALPRAZolam Prudy Feeler) 1 MG tablet Take 1 mg by mouth 3 (three) times daily as needed for anxiety.    Yes [provider]  azithromycin (ZITHROMAX) 250 MG tablet Take 1 tablet (250 mg total) by mouth daily. Take first 2 tablets together, then 1 every day until finished. 11/27/20  Yes Miriya Cloer A, NP  benzonatate (TESSALON) 100 MG capsule Take 1 capsule (100 mg total) by mouth every 8 (eight) hours. 11/27/20  Yes Shenouda Genova A, NP  escitalopram (LEXAPRO) 10 MG tablet Take 10 mg by mouth daily. 10/21/20  Yes [provider]  Vitamin D, Ergocalciferol, (DRISDOL) 1.25 MG (50000 UNIT) CAPS capsule Take 50,000 Units by mouth once a week. 09/29/20  Yes [provider]  albuterol (VENTOLIN HFA) 108 (90 Base) MCG/ACT inhaler Inhale into the lungs.    [provider]  buPROPion (WELLBUTRIN SR) 100 MG 12 hr tablet Take 1 tablet by mouth daily. 01/20/18 11/27/20  [provider]  dicyclomine (  BENTYL) 20 MG tablet Take 1 tablet (20 mg total) by mouth 2 (two) times daily. 04/20/17 10/31/20  Mackuen, Cindee Salt, MD    Family History Family History  Problem Relation Age of Onset  . Chronic fatigue Mother   . Hypertension Maternal Grandfather   . Stroke Father   . Anesthesia problems Neg Hx     Social History Social History   Tobacco Use  . Smoking status: Current Every Day Smoker    Packs/day: 0.50    Years: 8.00    Pack years: 4.00    Types: Cigarettes  . Smokeless tobacco: Never Used  Vaping Use  . Vaping Use: Never used  Substance Use Topics  . Alcohol use: Yes    Comment: 1-2 drinks per week  . Drug use: No     Allergies   Azithromycin, Vancomycin, Oxycodone, and Amoxicillin-pot clavulanate   Review of Systems Review of Systems  HENT: Positive for congestion, rhinorrhea and sinus pain.   Respiratory: Positive for cough.      Physical Exam Triage Vital Signs ED Triage Vitals [11/27/20 0821]  Enc  Vitals Group     BP 104/71     Pulse Rate 90     Resp 18     Temp 98.3 F (36.8 C)     Temp Source Oral     SpO2 98 %     Weight      Height      Head Circumference      Peak Flow      Pain Score      Pain Loc      Pain Edu?      Excl. in GC?    No data found.  Updated Vital Signs BP 104/71 (BP Location: Left Arm)   Pulse 90   Temp 98.3 F (36.8 C) (Oral)   Resp 18   LMP 06/03/2014   SpO2 98%   Visual Acuity Right Eye Distance:   Left Eye Distance:   Bilateral Distance:    Right Eye Near:   Left Eye Near:    Bilateral Near:     Physical Exam Vitals and nursing note reviewed.  Constitutional:      General: She is not in acute distress.    Appearance: Normal appearance. She is not ill-appearing, toxic-appearing or diaphoretic.  HENT:     Head: Normocephalic.     Right Ear: Tympanic membrane and ear canal normal.     Left Ear: Tympanic membrane and ear canal normal.     Nose: Congestion present.     Mouth/Throat:     Pharynx: Oropharynx is clear.  Eyes:     Conjunctiva/sclera: Conjunctivae normal.  Cardiovascular:     Rate and Rhythm: Normal rate and regular rhythm.  Pulmonary:     Effort: Pulmonary effort is normal.     Breath sounds: Normal breath sounds.  Musculoskeletal:        General: Normal range of motion.     Cervical back: Normal range of motion.  Skin:    General: Skin is warm and dry.     Findings: No rash.  Neurological:     Mental Status: She is alert.  Psychiatric:        Mood and Affect: Mood normal.      UC Treatments / Results  Labs (all labs ordered are listed, but only abnormal results are displayed) Labs Reviewed - No data to display  EKG   Radiology No results found.  Procedures  Procedures (including critical care time)  Medications Ordered in UC Medications - No data to display  Initial Impression / Assessment and Plan / UC Course  I have reviewed the triage vital signs and the nursing notes.  Pertinent labs  & imaging results that were available during my care of the patient were reviewed by me and considered in my medical decision making (see chart for details).     Cough Medicines as prescribed OTC meds as needed Follow up as needed for continued or worsening symptoms  Final Clinical Impressions(s) / UC Diagnoses   Final diagnoses:  Cough     Discharge Instructions     Medications as prescribed OTC medicines as needed Follow up as needed for continued or worsening symptoms     ED Prescriptions    Medication Sig Dispense Auth. Provider   azithromycin (ZITHROMAX) 250 MG tablet Take 1 tablet (250 mg total) by mouth daily. Take first 2 tablets together, then 1 every day until finished. 6 tablet Amador Braddy A, NP   benzonatate (TESSALON) 100 MG capsule Take 1 capsule (100 mg total) by mouth every 8 (eight) hours. 21 capsule Lynton Crescenzo A, NP     PDMP not reviewed this encounter.   Janace Aris, NP 11/27/20 [phone]

## 2020-11-27 NOTE — Discharge Instructions (Signed)
Medications as prescribed OTC medicines as needed Follow up as needed for continued or worsening symptoms

## 2021-01-29 ENCOUNTER — Other Ambulatory Visit: Payer: Self-pay

## 2021-01-29 ENCOUNTER — Ambulatory Visit
Admission: EM | Admit: 2021-01-29 | Discharge: 2021-01-29 | Disposition: A | Discharge status: Home / Self Care | Payer: Medicaid Other | Attending: Emergency Medicine | Admitting: Emergency Medicine

## 2021-01-29 DIAGNOSIS — B349 Viral infection, unspecified: Secondary | ICD-10-CM

## 2021-01-29 NOTE — ED Provider Notes (Signed)
Pamela Bailey    CSN: [id number] Arrival date & time: 01/29/21  0900      History   Chief Complaint Chief Complaint  Patient presents with  . Generalized Body Aches         HPI Pamela Bailey is a 40 y.o. female.   Patient presents with 2-day history of body aches, headache, sore throat.  Treatment at home with ibuprofen.  Patient states she has family members who are COVID positive.  She denies fever, rash, cough, shortness of breath, or other symptoms.  Her medical history includes asthma, cute renal failure, chronic fatigue, facial palsy, extremity weakness, chronic low back pain.  The history is provided by the patient and medical records.    Past Medical History:  Diagnosis Date  . Abnormal Pap smear    colpo 5/09  . Anxiety   . Asthma   . Cleft hard palate   . Cleft lip   . Depression   . Endometriosis   . Facial palsy   . Headache(784.0)   . Ovarian cyst   . PONV (postoperative nausea and vomiting)     Patient Active Problem List   Diagnosis Date Noted  . Encounter for medical examination to establish care 12/07/2017  . Chronic midline low back pain without sciatica 09/09/2016  . Weakness 06/21/2016  . Left leg numbness 06/21/2016  . Numbness of upper extremity 06/21/2016  . Weakness of upper extremity 06/21/2016  . Arm weakness 06/21/2016  . Bilateral arm weakness   . Hypokalemia   . Leukocytosis   . 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 Chronic fatigue 07/16/2015  . Anxiety 06/04/2015  . Headache 08/30/2008  . NAUSEA 08/30/2008  . ABDOMINAL PAIN-MULTIPLE SITES 08/30/2008  . RENAL FAILURE, ACUTE 08/29/2008    Past Surgical History:  Procedure Laterality Date  . ABDOMINAL HYSTERECTOMY    . ABDOMINAL SURGERY    . COSMETIC SURGERY    . LAPAROSCOPY    . NO PAST SURGERIES    . SALPINGECTOMY    . TYMPANOSTOMY TUBE PLACEMENT      OB History    Gravida  2   Para  2   Term  2   Preterm      AB      Living  2     SAB      IAB       Ectopic      Multiple      Live Births  2            Home Medications    Prior to Admission medications   Medication Sig Start Date End Date Taking? Authorizing Provider  albuterol (VENTOLIN HFA) 108 (90 Base) MCG/ACT inhaler Inhale into the lungs.   Yes [provider]  ALPRAZolam Prudy Feeler) 1 MG tablet Take 1 mg by mouth 3 (three) times daily as needed for anxiety.    Yes [provider]  escitalopram (LEXAPRO) 10 MG tablet Take 10 mg by mouth daily. 10/21/20  Yes [provider]  Vitamin D, Ergocalciferol, (DRISDOL) 1.25 MG (50000 UNIT) CAPS capsule Take 50,000 Units by mouth once a week. 09/29/20  Yes [provider]  azithromycin (ZITHROMAX) 250 MG tablet Take 1 tablet (250 mg total) by mouth daily. Take first 2 tablets together, then 1 every day until finished. 11/27/20   Dahlia Byes A, NP  benzonatate (TESSALON) 100 MG capsule Take 1 capsule (100 mg total) by mouth every 8 (eight) hours. 11/27/20   Dahlia Byes  A, NP  buPROPion (WELLBUTRIN SR) 100 MG 12 hr tablet Take 1 tablet by mouth daily. 01/20/18 11/27/20  [provider]  dicyclomine (BENTYL) 20 MG tablet Take 1 tablet (20 mg total) by mouth 2 (two) times daily. 04/20/17 10/31/20  Mackuen, Cindee Salt, MD    Family History Family History  Problem Relation Age of Onset  . Chronic fatigue Mother   . Hypertension Maternal Grandfather   . Stroke Father   . Anesthesia problems Neg Hx     Social History Social History   Tobacco Use  . Smoking status: Current Every Day Smoker    Packs/day: 0.50    Years: 8.00    Pack years: 4.00    Types: Cigarettes  . Smokeless tobacco: Never Used  Vaping Use  . Vaping Use: Some days  . Substances: Nicotine, Flavoring  Substance Use Topics  . Alcohol use: Yes    Comment: 1-2 drinks per week  . Drug use: No     Allergies   Azithromycin, Vancomycin, Oxycodone, and Amoxicillin-pot clavulanate   Review of Systems Review of Systems   Constitutional: Negative for chills and fever.  HENT: Positive for sore throat. Negative for ear pain.   Respiratory: Negative for cough and shortness of breath.   Cardiovascular: Negative for chest pain and palpitations.  Gastrointestinal: Negative for abdominal pain, diarrhea and vomiting.  Skin: Negative for color change and rash.  Neurological: Positive for headaches. Negative for weakness and numbness.  All other systems reviewed and are negative.    Physical Exam Triage Vital Signs ED Triage Vitals  Enc Vitals Group     BP 01/29/21 0916 121/80     Pulse Rate 01/29/21 0916 82     Resp 01/29/21 0916 18     Temp 01/29/21 0916 98.3 F (36.8 C)     Temp Source 01/29/21 0916 Oral     SpO2 01/29/21 0916 98 %     Weight --      Height --      Head Circumference --      Peak Flow --      Pain Score 01/29/21 0913 5     Pain Loc --      Pain Edu? --      Excl. in GC? --    No data found.  Updated Vital Signs BP 121/80 (BP Location: Left Arm)   Pulse 82   Temp 98.3 F (36.8 C) (Oral)   Resp 18   LMP 06/03/2014   SpO2 98%   Visual Acuity Right Eye Distance:   Left Eye Distance:   Bilateral Distance:    Right Eye Near:   Left Eye Near:    Bilateral Near:     Physical Exam Vitals and nursing note reviewed.  Constitutional:      General: She is not in acute distress.    Appearance: She is well-developed. She is not ill-appearing.  HENT:     Head: Normocephalic and atraumatic.     Right Ear: Tympanic membrane normal.     Left Ear: Tympanic membrane normal.     Nose: Nose normal.     Mouth/Throat:     Mouth: Mucous membranes are moist.     Pharynx: Oropharynx is clear.  Eyes:     Conjunctiva/sclera: Conjunctivae normal.  Cardiovascular:     Rate and Rhythm: Normal rate and regular rhythm.     Heart sounds: Normal heart sounds.  Pulmonary:     Effort: Pulmonary effort is normal.  No respiratory distress.     Breath sounds: Normal breath sounds.  Abdominal:      Palpations: Abdomen is soft.     Tenderness: There is no abdominal tenderness.  Musculoskeletal:     Cervical back: Neck supple.  Skin:    General: Skin is warm and dry.  Neurological:     General: No focal deficit present.     Mental Status: She is alert and oriented to person, place, and time.     Gait: Gait normal.  Psychiatric:        Mood and Affect: Mood normal.        Behavior: Behavior normal.      UC Treatments / Results  Labs (all labs ordered are listed, but only abnormal results are displayed) Labs Reviewed  NOVEL CORONAVIRUS, NAA    EKG   Radiology No results found.  Procedures Procedures (including critical care time)  Medications Ordered in UC Medications - No data to display  Initial Impression / Assessment and Plan / UC Course  I have reviewed the triage vital signs and the nursing notes.  Pertinent labs & imaging results that were available during my care of the patient were reviewed by me and considered in my medical decision making (see chart for details).   Viral illness. COVID pending.  Instructed patient to self quarantine until the test result is back.  Discussed symptomatic treatment including Tylenol or ibuprofen, rest, hydration.  Instructed patient to follow up with PCP if her symptoms are not improving.  Patient agrees to plan of care.    Final Clinical Impressions(s) / UC Diagnoses   Final diagnoses:  Viral illness     Discharge Instructions     Your COVID test is pending.  You should self quarantine until the test result is back.    Take Tylenol or ibuprofen as needed for fever or discomfort.  Rest and keep yourself hydrated.    Follow-up with your primary care provider if your symptoms are not improving.        ED Prescriptions    None     PDMP not reviewed this encounter.   Mickie Bail, NP 01/29/21 [phone]

## 2021-01-29 NOTE — Discharge Instructions (Addendum)
Your COVID test is pending.  You should self quarantine until the test result is back.    Take Tylenol or ibuprofen as needed for fever or discomfort.  Rest and keep yourself hydrated.    Follow-up with your primary care provider if your symptoms are not improving.     

## 2021-01-29 NOTE — ED Triage Notes (Signed)
Pt presents with body aches, HA and sore throat x 2 days.  Temp in 99s.  Worst symptom is the body aches.  Took Ibuprofen at home (last dose yesterday) which helped a little.

## 2021-01-30 LAB — SARS-COV-2, NAA 2 DAY TAT

## 2021-01-30 LAB — NOVEL CORONAVIRUS, NAA: SARS-CoV-2, NAA: NOT DETECTED

## 2021-02-26 ENCOUNTER — Other Ambulatory Visit: Payer: Self-pay

## 2021-02-26 DIAGNOSIS — M25561 Pain in right knee: Secondary | ICD-10-CM

## 2021-03-16 ENCOUNTER — Other Ambulatory Visit: Payer: Medicaid Other

## 2021-03-25 ENCOUNTER — Other Ambulatory Visit: Payer: Medicaid Other

## 2021-04-19 ENCOUNTER — Ambulatory Visit
Admission: EM | Admit: 2021-04-19 | Discharge: 2021-04-19 | Disposition: A | Discharge status: Home / Self Care | Payer: Medicaid Other | Attending: Family Medicine | Admitting: Family Medicine

## 2021-04-19 ENCOUNTER — Other Ambulatory Visit: Payer: Self-pay

## 2021-04-19 DIAGNOSIS — J069 Acute upper respiratory infection, unspecified: Secondary | ICD-10-CM | POA: Diagnosis not present

## 2021-04-19 MED ORDER — PREDNISONE 20 MG PO TABS: 40.0000 mg | ORAL_TABLET | Freq: Every day | ORAL | 0 refills | Status: DC

## 2021-04-19 MED ORDER — PROMETHAZINE-DM 6.25-15 MG/5ML PO SYRP: 5.0000 mL | ORAL_SOLUTION | Freq: Three times a day (TID) | ORAL | 0 refills | Status: DC | PRN

## 2021-04-19 NOTE — ED Triage Notes (Signed)
Pt states she has nasal drainage and wants covid test

## 2021-04-19 NOTE — Discharge Instructions (Addendum)
Your COVID 19 / Flu results should result within 2-5 days. Negative results are immediately resulted to Mychart. Positive results will receive a follow-up call from our clinic. If symptoms are present, I recommend home quarantine until results are known.  Alternate Tylenol and ibuprofen as needed for body aches and fever.  Symptom management per recommendations discussed today.  If any breathing difficulty or chest pain develops go immediately to the closest emergency department for evaluation.  

## 2021-04-19 NOTE — ED Provider Notes (Signed)
[name]    CSN: [id number] Arrival date & time: 04/19/21  6269      History   Chief Complaint Chief Complaint  Patient presents with   Nasal Congestion    HPI Pamela Bailey is a 40 y.o. female.   HPI Patient present for evaluation of URI.  Patient's mom is sick with similar course of symptoms. She complains of persistent nasal, mild cough, and low grade fever. Denies any shortness of breath.  Would like a COVID test as her mother tested positive for COVID-19 more than 3 weeks ago however she denies any close contact. She has been taking ibuprofen and Tylenol for management of symptoms    Past Medical History:  Diagnosis Date   Abnormal Pap smear    colpo 5/09   Anxiety    Asthma    Cleft hard palate    Cleft lip    Depression    Endometriosis    Facial palsy    Headache(784.0)    Ovarian cyst    PONV (postoperative nausea and vomiting)     Patient Active Problem List   Diagnosis Date Noted   Encounter for medical examination to establish care 12/07/2017   Chronic midline low back pain without sciatica 09/09/2016   Weakness 06/21/2016   Left leg numbness 06/21/2016   Numbness of upper extremity 06/21/2016   Weakness of upper extremity 06/21/2016   Arm weakness 06/21/2016   Bilateral arm weakness    Hypokalemia    Leukocytosis    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   Chronic fatigue 07/16/2015   Anxiety 06/04/2015   Headache 08/30/2008   NAUSEA 08/30/2008   ABDOMINAL PAIN-MULTIPLE SITES 08/30/2008   RENAL FAILURE, ACUTE 08/29/2008    Past Surgical History:  Procedure Laterality Date   ABDOMINAL HYSTERECTOMY     ABDOMINAL SURGERY     COSMETIC SURGERY     LAPAROSCOPY     NO PAST SURGERIES     SALPINGECTOMY     TYMPANOSTOMY TUBE PLACEMENT      OB History     Gravida  2   Para  2   Term  2   Preterm      AB      Living  2      SAB      IAB      Ectopic      Multiple      Live Births  2            Home  Medications    Prior to Admission medications   Medication Sig Start Date End Date Taking? Authorizing Provider  predniSONE (DELTASONE) 20 MG tablet Take 2 tablets (40 mg total) by mouth daily with breakfast. 04/19/21  Yes Bing Neighbors, FNP  promethazine-dextromethorphan (PROMETHAZINE-DM) 6.25-15 MG/5ML syrup Take 5 mLs by mouth 3 (three) times daily as needed for cough. 04/19/21  Yes Bing Neighbors, FNP  albuterol (VENTOLIN HFA) 108 (90 Base) MCG/ACT inhaler Inhale into the lungs.    [provider]  ALPRAZolam Prudy Feeler) 1 MG tablet Take 1 mg by mouth 3 (three) times daily as needed for anxiety.     [provider]  azithromycin (ZITHROMAX) 250 MG tablet Take 1 tablet (250 mg total) by mouth daily. Take first 2 tablets together, then 1 every day until finished. 11/27/20   Dahlia Byes A, NP  benzonatate (TESSALON) 100 MG capsule Take 1 capsule (100 mg total) by mouth every 8 (eight) hours. 11/27/20   Jaci Lazier,  Traci A, NP  escitalopram (LEXAPRO) 10 MG tablet Take 10 mg by mouth daily. 10/21/20   [provider]  Vitamin D, Ergocalciferol, (DRISDOL) 1.25 MG (50000 UNIT) CAPS capsule Take 50,000 Units by mouth once a week. 09/29/20   [provider]  buPROPion (WELLBUTRIN SR) 100 MG 12 hr tablet Take 1 tablet by mouth daily. 01/20/18 11/27/20  [provider]  dicyclomine (BENTYL) 20 MG tablet Take 1 tablet (20 mg total) by mouth 2 (two) times daily. 04/20/17 10/31/20  Mackuen, Cindee Salt, MD    Family History Family History  Problem Relation Age of Onset   Chronic fatigue Mother    Hypertension Maternal Grandfather    Stroke Father    Anesthesia problems Neg Hx     Social History Social History   Tobacco Use   Smoking status: Every Day    Packs/day: 0.50    Years: 8.00    Pack years: 4.00    Types: Cigarettes   Smokeless tobacco: Never  Vaping Use   Vaping Use: Some days   Substances: Nicotine, Flavoring  Substance Use Topics   Alcohol use:  Yes    Comment: 1-2 drinks per week   Drug use: No     Allergies   Azithromycin, Vancomycin, Oxycodone, and Amoxicillin-pot clavulanate   Review of Systems Review of Systems Pertinent negatives listed in HPI   Physical Exam Triage Vital Signs ED Triage Vitals  Enc Vitals Group     BP 04/19/21 0839 115/79     Pulse Rate 04/19/21 0839 84     Resp 04/19/21 0839 18     Temp 04/19/21 0839 97.8 F (36.6 C)     Temp src --      SpO2 04/19/21 0839 98 %     Weight --      Height --      Head Circumference --      Peak Flow --      Pain Score 04/19/21 0838 0     Pain Loc --      Pain Edu? --      Excl. in GC? --    No data found.  Updated Vital Signs BP 115/79   Pulse 84   Temp 97.8 F (36.6 C)   Resp 18   LMP 06/03/2014   SpO2 98%   Visual Acuity Right Eye Distance:   Left Eye Distance:   Bilateral Distance:    Right Eye Near:   Left Eye Near:    Bilateral Near:     Physical Exam  General Appearance:    Alert, cooperative, no distress  HENT:   Normocephalic, ears normal, nares mucosal edema with congestion, rhinorrhea, oropharynx clear  Eyes:    PERRL, conjunctiva/corneas clear, EOM's intact       Lungs:   Course but clear to auscultation bilaterally, respirations unlabored  Heart:    Regular rate and rhythm  Neurologic:   Awake, alert, oriented x 3. No apparent focal neurological           defect.     UC Treatments / Results  Labs (all labs ordered are listed, but only abnormal results are displayed) Labs Reviewed  COVID-19, FLU A+B NAA    EKG   Radiology No results found.  Procedures Procedures (including critical care time)  Medications Ordered in UC Medications - No data to display  Initial Impression / Assessment and Plan / UC Course  I have reviewed the triage vital signs and the nursing  notes.   COVID/Flu test pending. Symptom management warranted only.  Manage fever with Tylenol and ibuprofen.  Nasal symptoms with over-the-counter  antihistamines recommended.  Treatment per discharge medications/discharge instructions.  Red flags/ER precautions given. The most current CDC isolation/quarantine recommendation advised.    Final Clinical Impressions(s) / UC Diagnoses   Final diagnoses:  Viral URI with cough     Discharge Instructions      Your COVID 19 / Flu results should result within 2-5 days. Negative results are immediately resulted to Mychart. Positive results will receive a follow-up call from our clinic. If symptoms are present, I recommend home quarantine until results are known.  Alternate Tylenol and ibuprofen as needed for body aches and fever.  Symptom management per recommendations discussed today.  If any breathing difficulty or chest pain develops go immediately to the closest emergency department for evaluation.      ED Prescriptions     Medication Sig Dispense Auth. Provider   predniSONE (DELTASONE) 20 MG tablet Take 2 tablets (40 mg total) by mouth daily with breakfast. 10 tablet Bing Neighbors, FNP   promethazine-dextromethorphan (PROMETHAZINE-DM) 6.25-15 MG/5ML syrup Take 5 mLs by mouth 3 (three) times daily as needed for cough. 140 mL Bing Neighbors, FNP      PDMP not reviewed this encounter.   Bing Neighbors, [place] 04/19/21 [phone]

## 2021-04-22 LAB — COVID-19, FLU A+B NAA
Influenza A, NAA: NOT DETECTED
Influenza B, NAA: NOT DETECTED
SARS-CoV-2, NAA: NOT DETECTED

## 2021-05-03 ENCOUNTER — Ambulatory Visit
Admission: RE | Admit: 2021-05-03 | Discharge: 2021-05-03 | Disposition: A | Discharge status: Home / Self Care | Payer: Medicaid Other | Source: Ambulatory Visit | Attending: Orthopedic Surgery | Admitting: Orthopedic Surgery

## 2021-05-03 ENCOUNTER — Other Ambulatory Visit: Payer: Self-pay

## 2021-05-03 DIAGNOSIS — M25561 Pain in right knee: Secondary | ICD-10-CM

## 2021-06-01 ENCOUNTER — Other Ambulatory Visit: Payer: Self-pay

## 2021-06-01 ENCOUNTER — Encounter: Payer: Self-pay | Admitting: Emergency Medicine

## 2021-06-01 ENCOUNTER — Ambulatory Visit
Admission: EM | Admit: 2021-06-01 | Discharge: 2021-06-01 | Disposition: A | Discharge status: Home / Self Care | Payer: Medicaid Other | Attending: Emergency Medicine | Admitting: Emergency Medicine

## 2021-06-01 ENCOUNTER — Ambulatory Visit (INDEPENDENT_AMBULATORY_CARE_PROVIDER_SITE_OTHER): Payer: Medicaid Other

## 2021-06-01 DIAGNOSIS — R059 Cough, unspecified: Secondary | ICD-10-CM | POA: Diagnosis not present

## 2021-06-01 DIAGNOSIS — J22 Unspecified acute lower respiratory infection: Secondary | ICD-10-CM

## 2021-06-01 MED ORDER — PREDNISONE 10 MG PO TABS: ORAL_TABLET | ORAL | 0 refills | Status: AC

## 2021-06-01 MED ORDER — DOXYCYCLINE HYCLATE 100 MG PO CAPS: 100.0000 mg | ORAL_CAPSULE | Freq: Two times a day (BID) | ORAL | 0 refills | Status: DC

## 2021-06-01 NOTE — Discharge Instructions (Addendum)
Take prednisone and doxycycline as prescribed.  Rest, push lots of fluids (especially water), and utilize supportive care for symptoms. You may take take acetaminophen (Tylenol) every 4-6 hours or ibuprofen every 6-8 hours for muscle pain, joint pain, headaches. Mucinex (guaifenesin) may be taken over the counter for cough as needed and can loosen phlegm. Please read the instructions and take as directed. Saline nasal sprays to rinse congestion can help as well. Warm tea with lemon and honey can sooth sore throat and cough, as can cough drops.  Take Mucinex for cough  Return to clinic for new-onset fever, difficulty breathing, chest pain, symptoms lasting >3 to 4 weeks, or bloody sputum.

## 2021-06-01 NOTE — ED Triage Notes (Signed)
Pt here with chest congestion and productive cough and nasal congestion for over a week.

## 2021-06-01 NOTE — ED Provider Notes (Signed)
CHIEF COMPLAINT:   Chief Complaint  Patient presents with   Cough   Nasal Congestion     SUBJECTIVE/HPI:   Cough A very pleasant 40 y.o.Female presents today with chest congestion and productive cough along with nasal congestion for a little over a week.  Patient reports that she has been coughing up very thick mucus and states that sometimes she is unable to cough the mucus up at all.  Patient reports a history of "chest infections". Patient does not report any shortness of breath, chest pain, palpitations, visual changes, weakness, tingling, headache, nausea, vomiting, diarrhea, fever, chills.   has a past medical history of Abnormal Pap smear, Anxiety, Asthma, Cleft hard palate, Cleft lip, Depression, Endometriosis, Facial palsy, Headache(784.0), Ovarian cyst, and PONV (postoperative nausea and vomiting).  ROS:  Review of Systems  Respiratory:  Positive for cough.   See Subjective/HPI Medications, Allergies and Problem List personally reviewed in Epic today OBJECTIVE:   Vitals:   06/01/21 1832  BP: 117/82  Pulse: 100  Resp: 18  Temp: 99.3 F (37.4 C)  SpO2: 96%    Physical Exam   General: Appears well-developed and well-nourished. No acute distress.  HEENT Head: Normocephalic and atraumatic.   Ears: Hearing grossly intact, no drainage or visible deformity.  Nose: No nasal deviation.   Mouth/Throat: No stridor or tracheal deviation.  Non erythematous posterior pharynx noted with clear drainage present.  No white patchy exudate noted. Eyes: Conjunctivae and EOM are normal. No eye drainage or scleral icterus bilaterally.  Neck: Normal range of motion, neck is supple.  Cardiovascular: Normal rate. Regular rhythm; no murmurs, gallops, or rubs.  Pulm/Chest: No respiratory distress. Breath sounds normal bilaterally without wheezes, rhonchi, or rales.  Neurological: Alert and oriented to person, place, and time.  Skin: Skin is warm and dry.  No rashes, lesions, abrasions or  bruising noted to skin.   Psychiatric: Normal mood, affect, behavior, and thought content.   Vital signs and nursing note reviewed.   Patient stable and cooperative with examination. PROCEDURES:    LABS/X-RAYS/EKG/MEDS:   No results found for any visits on 06/01/21.  MEDICAL DECISION MAKING:   Patient presents with chest congestion and productive cough along with nasal congestion for a little over a week.  Patient reports that she has been coughing up very thick mucus and states that sometimes she is unable to cough the mucus up at all.  Patient reports a history of "chest infections". Patient does not report any shortness of breath, chest pain, palpitations, visual changes, weakness, tingling, headache, nausea, vomiting, diarrhea, fever, chills.  Chart review completed. CXR no pneumonia/pneumothorax, as read by me. Overread pending. Likely, lower respiratory tract infection.  Rx prednisone and doxycycline to the patient's preferred pharmacy and advised about home treatment and care to include rest, fluids, Tylenol versus ibuprofen, Mucinex.  Return to clinic for new onset fever, difficulty breathing, chest pain, symptoms lasting longer than 3 to 4 weeks or bloody sputum.  Patient verbalized understanding and agreed with treatment plan.  Patient stable upon discharge. ASSESSMENT/PLAN:  1. Lower respiratory tract infection - predniSONE (DELTASONE) 10 MG tablet; Take 6 tablets (60 mg total) by mouth daily for 1 day, THEN 5 tablets (50 mg total) daily for 1 day, THEN 4 tablets (40 mg total) daily for 1 day, THEN 3 tablets (30 mg total) daily for 1 day, THEN 2 tablets (20 mg total) daily for 1 day, THEN 1 tablet (10 mg total) daily for 1 day.  Dispense: 21  tablet; Refill: 0 - doxycycline (VIBRAMYCIN) 100 MG capsule; Take 1 capsule (100 mg total) by mouth 2 (two) times daily.  Dispense: 20 capsule; Refill: 0 Instructions about new medications and side effects provided.  Plan:   Discharge  Instructions      Take prednisone and doxycycline as prescribed.  Rest, push lots of fluids (especially water), and utilize supportive care for symptoms. You may take take acetaminophen (Tylenol) every 4-6 hours or ibuprofen every 6-8 hours for muscle pain, joint pain, headaches. Mucinex (guaifenesin) may be taken over the counter for cough as needed and can loosen phlegm. Please read the instructions and take as directed. Saline nasal sprays to rinse congestion can help as well. Warm tea with lemon and honey can sooth sore throat and cough, as can cough drops.  Take Mucinex for cough  Return to clinic for new-onset fever, difficulty breathing, chest pain, symptoms lasting >3 to 4 weeks, or bloody sputum.          Amalia Greenhouse, FNP 06/01/21 [id number]

## 2021-11-06 ENCOUNTER — Encounter: Payer: Self-pay | Admitting: Gastroenterology

## 2021-12-09 ENCOUNTER — Encounter: Payer: Self-pay | Admitting: Gastroenterology

## 2021-12-09 ENCOUNTER — Ambulatory Visit: Payer: Medicaid Other | Admitting: Gastroenterology

## 2021-12-09 ENCOUNTER — Other Ambulatory Visit (INDEPENDENT_AMBULATORY_CARE_PROVIDER_SITE_OTHER): Payer: Medicaid Other

## 2021-12-09 VITALS — BP 100/66 | HR 60 | Ht 62.0 in | Wt 89.1 lb

## 2021-12-09 DIAGNOSIS — R1011 Right upper quadrant pain: Secondary | ICD-10-CM | POA: Diagnosis not present

## 2021-12-09 LAB — COMPREHENSIVE METABOLIC PANEL
ALT: 17 U/L (ref 0–35)
AST: 19 U/L (ref 0–37)
Albumin: 4.6 g/dL (ref 3.5–5.2)
Alkaline Phosphatase: 92 U/L (ref 39–117)
BUN: 18 mg/dL (ref 6–23)
CO2: 29 mEq/L (ref 19–32)
Calcium: 10 mg/dL (ref 8.4–10.5)
Chloride: 102 mEq/L (ref 96–112)
Creatinine, Ser: 0.68 mg/dL (ref 0.40–1.20)
GFR: 107.58 mL/min (ref >60.00)
Glucose, Bld: 87 mg/dL (ref 70–99)
Potassium: 4.4 mEq/L (ref 3.5–5.1)
Sodium: 137 mEq/L (ref 135–145)
Total Bilirubin: 0.5 mg/dL (ref 0.2–1.2)
Total Protein: 7.1 g/dL (ref 6.0–8.3)

## 2021-12-09 LAB — CBC
HCT: 42.2 % (ref 36.0–46.0)
Hemoglobin: 14.3 g/dL (ref 12.0–15.0)
MCHC: 33.9 g/dL (ref 30.0–36.0)
MCV: 96.1 fl (ref 78.0–100.0)
Platelets: 235 10*3/uL (ref 150.0–400.0)
RBC: 4.38 Mil/uL (ref 3.87–5.11)
RDW: 12.8 % (ref 11.5–15.5)
WBC: 9 10*3/uL (ref 4.0–10.5)

## 2021-12-09 NOTE — Progress Notes (Signed)
HPI: ?This is a pleasant 41 year old woman who was referred to me by Courtney Paris, NP  to evaluate chronic abdominal pain.   ? ?She has had right-sided abdominal pains for several years.  The pains are intermittent generally, lasting 45 minutes to 1 hour.  They are somewhat colicky in nature, these to be associate with some nausea.  The pains will usually migrate from her right upper quadrant around to her left shoulder.  She takes Motrin 3-4 times per week.  She is on chronic narcotic pain medicines for "fibromyalgia and endometriosis". ? ?She tells me she had a HIDA scan many years ago and it was normal.  I cannot find any of that testing result here or in care everywhere. ? ?She thinks she had a colonoscopy and upper endoscopy many years ago somewhere in [facility].  I cannot find those results.  She believes that she might of had polyps in her colon.  She thinks her endoscopy was normal. ? ?Her grandmother had colon cancer. ? ?Her weight is stable, she is thin now and has always been very thin. ? ?Old Data Reviewed: ?Blood work January 2023 normal lipase, normal hemoglobin A1c, normal TSH, normal ANA, normal CBC, normal complete metabolic profile ? ?CT scan abdomen pelvis with IV and oral contrast August 2018 indication "right lower quadrant pain.  Nausea, vomiting and fever.  Findings "no acute abnormality" ? ?Ultrasound through [facility] system 10/2021 indication "unspecified abdominal pain."  Findings "several tiny gallbladder polyps versus adherent cholesterol stones are present, none measuring greater than 2 mm."  Normal bile duct, rest of the complete abdominal examination was otherwise normal. ? ?Review of systems: ?Pertinent positive and negative review of systems were noted in the above HPI section. All other review negative. ? ? ?Past Medical History:  ?Diagnosis Date  ? Abnormal Pap smear   ? colpo 5/09  ? Anxiety   ? Asthma   ? Cleft hard palate   ? Cleft lip   ? Depression   ? Endometriosis   ? Facial  palsy   ? Headache(784.0)   ? Ovarian cyst   ? PONV (postoperative nausea and vomiting)   ? ? ?Past Surgical History:  ?Procedure Laterality Date  ? ABDOMINAL HYSTERECTOMY    ? ABDOMINAL SURGERY    ? COSMETIC SURGERY    ? LAPAROSCOPY    ? SALPINGECTOMY    ? TYMPANOSTOMY TUBE PLACEMENT    ? ? ?Current Outpatient Medications  ?Medication Instructions  ? albuterol (VENTOLIN HFA) 108 (90 Base) MCG/ACT inhaler Inhalation  ? ALPRAZolam (XANAX) 1 mg, Oral, 3 times daily PRN  ? buPROPion ER (WELLBUTRIN SR) 100 mg, Oral, Daily  ? dicyclomine (BENTYL) 20 mg, Oral, 2 times daily  ? escitalopram (LEXAPRO) 10 mg, Oral, Daily  ? HYDROcodone-acetaminophen (NORCO) 10-325 MG tablet 0.5 tablets, Oral, 4 times daily PRN  ? Vitamin D (Ergocalciferol) (DRISDOL) 50,000 Units, Oral, Weekly  ? ? ?Allergies as of 12/09/2021 - Review Complete 12/09/2021  ?Allergen Reaction Noted  ? Azithromycin Other (See Comments) 08/18/2015  ? Vancomycin Other (See Comments) 08/06/2011  ? Oxycodone Nausea And Vomiting 11/19/2016  ? Amoxicillin-pot clavulanate Nausea And Vomiting, Rash, and Other (See Comments) 12/05/2011  ? ? ?Family History  ?Problem Relation Age of Onset  ? Chronic fatigue Mother   ? Stroke Father   ? Colon cancer Maternal Grandmother   ? Hypertension Maternal Grandfather   ? Anesthesia problems Neg Hx   ? ? ?Social History  ? ?Socioeconomic History  ?  Marital status: Single  ?  Spouse name: Not on file  ? Number of children: Not on file  ? Years of education: Not on file  ? Highest education level: Not on file  ?Occupational History  ? Not on file  ?Tobacco Use  ? Smoking status: Every Day  ?  Packs/day: 0.50  ?  Years: 8.00  ?  Pack years: 4.00  ?  Types: Cigarettes  ? Smokeless tobacco: Never  ?Vaping Use  ? Vaping Use: Some days  ? Substances: Nicotine, Flavoring  ?Substance and Sexual Activity  ? Alcohol use: Yes  ?  Comment: 1-2 drinks per week  ? Drug use: No  ? Sexual activity: Yes  ?  Birth control/protection: Surgical  ?   Comment: Hysterectomy  ?Other Topics Concern  ? Not on file  ?Social History Narrative  ? Drinks about 1 cup of coffee a day   ? ?Social Determinants of Health  ? ?Financial Resource Strain: Not on file  ?Food Insecurity: Not on file  ?Transportation Needs: Not on file  ?Physical Activity: Not on file  ?Stress: Not on file  ?Social Connections: Not on file  ?Intimate Partner Violence: Not on file  ? ? ? ?Physical Exam: ?BP 100/66   Pulse 60   Ht 5\' 2"  (1.575 m)   Wt 89 lb 1.6 oz (40.4 kg)   LMP 06/03/2014   BMI 16.30 kg/m?  ?Constitutional: generally well-appearing ?Psychiatric: alert and oriented x3 ?Eyes: extraocular movements intact ?Mouth: oral pharynx moist, no lesions ?Neck: supple no lymphadenopathy ?Cardiovascular: heart regular rate and rhythm ?Lungs: clear to auscultation bilaterally ?Abdomen: soft, nontender, nondistended, no obvious ascites, no peritoneal signs, normal bowel sounds ?Extremities: no lower extremity edema bilaterally ?Skin: no lesions on visible extremities ? ? ?Assessment and plan: ?41 y.o. female with chronic right-sided abdominal pains ? ?Unclear etiology.  Somewhat consistent with gallstone, biliary colic.  I recommended we start with blood work including celiac sprue testing, CBC, complete metabolic profile as well as EGD at her soonest convenience.  She understands that if these tests are unhelpful unrevealing then I might recommend she see a surgery doctor to consider elective cholecystectomy given the possible gallstones in her gallbladder on recent outside ultrasound ? ? ?Please see the "Patient Instructions" section for addition details about the plan. ? ? ?44, MD ?[facility] ?12/09/2021, 9:36 AM ? ?Cc: 12/11/2021, NP ? ?Total time on date of encounter was 45  minutes (this included time spent preparing to see the patient reviewing records; obtaining and/or reviewing separately obtained history; performing a medically appropriate exam and/or  evaluation; counseling and educating the patient and family if present; ordering medications, tests or procedures if applicable; and documenting clinical information in the health record). ? ? ?

## 2021-12-09 NOTE — Patient Instructions (Addendum)
If you are age 42 or younger, your body mass index should be between 19-25. Your Body mass index is 16.3 kg/m?Marland Kitchen If this is out of the aformentioned range listed, please consider follow up with your Primary Care Provider.  ?________________________________________________________ ? ?The [facility] providers would like to encourage you to use [facility] to communicate with providers for non-urgent requests or questions.  Due to long hold times on the telephone, sending your provider a message by [facility] may be a faster and more efficient way to get a response.  Please allow 48 business hours for a response.  Please remember that this is for non-urgent requests.  ?_______________________________________________________ ? ?Your provider has requested that you go to the basement level for lab work before leaving today. Press "B" on the elevator. The lab is located at the first door on the left as you exit the elevator. ? ?Due to recent changes in healthcare laws, you may see the results of your imaging and laboratory studies on MyChart before your provider has had a chance to review them.  We understand that in some cases there may be results that are confusing or concerning to you. Not all laboratory results come back in the same time frame and the provider may be waiting for multiple results in order to interpret others.  Please give [place] 48 hours in order for your provider to thoroughly review all the results before contacting the office for clarification of your results.  ? ?You have been scheduled for an endoscopy. Please follow written instructions given to you at your visit today. ?If you use inhalers (even only as needed), please bring them with you on the day of your procedure. ? ?Thank you for entrusting me with your care and choosing [facility]. ? ?Dr Christella Hartigan ? ?

## 2021-12-10 LAB — TISSUE TRANSGLUTAMINASE, IGA: (tTG) Ab, IgA: <1 U/mL

## 2021-12-10 LAB — IGA: Immunoglobulin A: 253 mg/dL (ref 47–310)

## 2021-12-13 ENCOUNTER — Other Ambulatory Visit: Payer: Self-pay

## 2021-12-13 ENCOUNTER — Emergency Department (HOSPITAL_COMMUNITY)
Admission: EM | Admit: 2021-12-13 | Discharge: 2021-12-13 | Disposition: A | Discharge status: Home / Self Care | Payer: Medicaid Other | Attending: Emergency Medicine | Admitting: Emergency Medicine

## 2021-12-13 ENCOUNTER — Encounter (HOSPITAL_COMMUNITY): Payer: Self-pay | Admitting: Emergency Medicine

## 2021-12-13 ENCOUNTER — Emergency Department (HOSPITAL_COMMUNITY): Payer: Medicaid Other

## 2021-12-13 DIAGNOSIS — R1011 Right upper quadrant pain: Secondary | ICD-10-CM | POA: Insufficient documentation

## 2021-12-13 DIAGNOSIS — R109 Unspecified abdominal pain: Secondary | ICD-10-CM

## 2021-12-13 LAB — CBC WITH DIFFERENTIAL/PLATELET
Abs Immature Granulocytes: 0.02 10*3/uL (ref 0.00–0.07)
Basophils Absolute: 0 10*3/uL (ref 0.0–0.1)
Basophils Relative: 0 %
Eosinophils Absolute: 0.1 10*3/uL (ref 0.0–0.5)
Eosinophils Relative: 1 %
HCT: 40.9 % (ref 36.0–46.0)
Hemoglobin: 14 g/dL (ref 12.0–15.0)
Immature Granulocytes: 0 %
Lymphocytes Relative: 25 %
Lymphs Abs: 2.9 10*3/uL (ref 0.7–4.0)
MCH: 32.9 pg (ref 26.0–34.0)
MCHC: 34.2 g/dL (ref 30.0–36.0)
MCV: 96 fL (ref 80.0–100.0)
Monocytes Absolute: 0.8 10*3/uL (ref 0.1–1.0)
Monocytes Relative: 7 %
Neutro Abs: 7.7 10*3/uL (ref 1.7–7.7)
Neutrophils Relative %: 67 %
Platelets: 229 10*3/uL (ref 150–400)
RBC: 4.26 MIL/uL (ref 3.87–5.11)
RDW: 12 % (ref 11.5–15.5)
WBC: 11.5 10*3/uL — ABNORMAL HIGH (ref 4.0–10.5)
nRBC: 0 % (ref 0.0–0.2)

## 2021-12-13 LAB — COMPREHENSIVE METABOLIC PANEL
ALT: 14 U/L (ref 0–44)
AST: 16 U/L (ref 15–41)
Albumin: 4.2 g/dL (ref 3.5–5.0)
Alkaline Phosphatase: 81 U/L (ref 38–126)
Anion gap: 7 (ref 5–15)
BUN: 13 mg/dL (ref 6–20)
CO2: 25 mmol/L (ref 22–32)
Calcium: 9.3 mg/dL (ref 8.9–10.3)
Chloride: 105 mmol/L (ref 98–111)
Creatinine, Ser: 0.66 mg/dL (ref 0.44–1.00)
GFR, Estimated: >60 mL/min (ref >60)
Glucose, Bld: 86 mg/dL (ref 70–99)
Potassium: 3.6 mmol/L (ref 3.5–5.1)
Sodium: 137 mmol/L (ref 135–145)
Total Bilirubin: 0.3 mg/dL (ref 0.3–1.2)
Total Protein: 6.9 g/dL (ref 6.5–8.1)

## 2021-12-13 LAB — LIPASE, BLOOD: Lipase: 31 U/L (ref 11–51)

## 2021-12-13 MED ORDER — IOHEXOL 300 MG/ML  SOLN
80.0000 mL | Freq: Once | INTRAMUSCULAR | Status: AC | PRN
  Administered 2021-12-13: 80 mL via INTRAVENOUS

## 2021-12-13 MED ORDER — IOHEXOL 9 MG/ML PO SOLN: 1000.0000 mL | ORAL | Status: AC

## 2021-12-13 NOTE — ED Provider Notes (Addendum)
?[facility]-EMERGENCY DEPT ?Provider Note ? ? ?CSN: [id number] ?Arrival date & time: 12/13/21  1645 ? ?  ? ?History ? ?Chief Complaint  ?Patient presents with  ? Abdominal Pain  ? ? ?Pamela Bailey is a 41 y.o. female. ? ?Patient presents to ER with her mother.  Chief complaint of right upper quadrant pain.  She states she has had this pain for several years off and on.  She has had ultrasounds done and HIDA scan done and multiple evaluations with blood work which were all unremarkable.  She seen her gastroenterologist last week and they are planning for endoscopy in about a week.  She returns to the ER because she has had a flareup of right upper quadrant pain described as sharp and aching worse when she eats spicy foods or alcohol or certain other drinks.  Otherwise denies fevers denies vomiting denies diarrhea complains of nausea. ? ? ?  ? ?Home Medications ?Prior to Admission medications   ?Medication Sig Start Date End Date Taking? Authorizing Provider  ?albuterol (VENTOLIN HFA) 108 (90 Base) MCG/ACT inhaler Inhale into the lungs.    [provider]  ?ALPRAZolam Prudy Feeler) 1 MG tablet Take 1 mg by mouth 3 (three) times daily as needed for anxiety.     [provider]  ?buPROPion ER (WELLBUTRIN SR) 100 MG 12 hr tablet Take 100 mg by mouth daily. 12/09/21 01/07/22  [provider]  ?dicyclomine (BENTYL) 20 MG tablet Take 20 mg by mouth 2 (two) times daily. 12/09/21 01/01/22  [provider]  ?escitalopram (LEXAPRO) 10 MG tablet Take 10 mg by mouth daily. 10/21/20   [provider]  ?HYDROcodone-acetaminophen (NORCO) 10-325 MG tablet Take 0.5 tablets by mouth 4 (four) times daily as needed. 11/10/21   [provider]  ?Vitamin D, Ergocalciferol, (DRISDOL) 1.25 MG (50000 UNIT) CAPS capsule Take 50,000 Units by mouth once a week. 09/29/20   [provider]  ?   ? ?Allergies    ?Azithromycin, Vancomycin, Oxycodone, and Amoxicillin-pot  clavulanate   ? ?Review of Systems   ?Review of Systems ? ?Physical Exam ?Updated Vital Signs ?BP 120/73   Pulse 80   Temp 98.1 ?F (36.7 ?C) (Oral)   Resp 17   LMP 06/03/2014   SpO2 99%  ?Physical Exam ? ?ED Results / Procedures / Treatments   ?Labs ?(all labs ordered are listed, but only abnormal results are displayed) ?Labs Reviewed  ?CBC WITH DIFFERENTIAL/PLATELET - Abnormal; Notable for the following components:  ?    Result Value  ? WBC 11.5 (*)   ? All other components within normal limits  ?COMPREHENSIVE METABOLIC PANEL  ?LIPASE, BLOOD  ? ? ?EKG ?None ? ?Radiology ?CT Abdomen Pelvis W Contrast ? ?Result Date: 12/13/2021 ?CLINICAL DATA:  Right upper quadrant abdominal pain for several months. EXAM: CT ABDOMEN AND PELVIS WITH CONTRAST TECHNIQUE: Multidetector CT imaging of the abdomen and pelvis was performed using the standard protocol following bolus administration of intravenous contrast. RADIATION DOSE REDUCTION: This exam was performed according to the departmental dose-optimization program which includes automated exposure control, adjustment of the mA and/or kV according to patient size and/or use of iterative reconstruction technique. CONTRAST:  32mL OMNIPAQUE IOHEXOL 300 MG/ML  SOLN COMPARISON:  CT abdomen pelvis dated 04/20/2017. FINDINGS: Lower chest: No acute abnormality. Hepatobiliary: No focal liver abnormality is seen. No gallstones, gallbladder wall thickening, or biliary dilatation. Pancreas: Unremarkable. No pancreatic ductal dilatation or surrounding inflammatory changes. Spleen: No splenic injury or perisplenic hematoma.  Adrenals/Urinary Tract: Adrenal glands are unremarkable. Kidneys are normal, without renal calculi, focal lesion, or hydronephrosis. Bladder is unremarkable. Stomach/Bowel: Stomach is within normal limits. Enteric contrast reaches the cecum. Appendix appears normal. No evidence of bowel wall thickening, distention, or inflammatory changes. Vascular/Lymphatic: Aortic  atherosclerosis. No enlarged abdominal or pelvic lymph nodes. Reproductive: Status post hysterectomy. No adnexal masses. Other: No abdominal wall hernia or abnormality. No abdominopelvic ascites. Musculoskeletal: No acute or significant osseous findings. IMPRESSION: No findings to explain the patient's symptoms. Electronically Signed   By: Romona Curls M.D.   On: 12/13/2021 22:24   ? ?Procedures ?Procedures  ? ? ?Medications Ordered in ED ?Medications  ?iohexol (OMNIPAQUE) 9 MG/ML oral solution 1,000 mL (has no administration in time range)  ?iohexol (OMNIPAQUE) 300 MG/ML solution 80 mL (80 mLs Intravenous Contrast Given 12/13/21 2216)  ? ? ?ED Course/ Medical Decision Making/ A&P ?  ?                        ?Medical Decision Making ?Amount and/or Complexity of Data Reviewed ?Radiology: ordered. ? ?Risk ?Prescription drug management. ? ? ?Chart review shows visit with her orthopedic surgeon last week. ? ?History obtained from the patient and caregiver at bedside. ? ?Cardiac monitoring shows sinus rhythm. ? ?Work-up includes CBC white count 11 chemistry remarkable lipase normal liver enzymes are normal T. bili is normal as well. ? ?Review of record shows ultrasounds in the past that showed small gallstones were gallbladder polyps.  No evidence of acute cholecystitis and past images.  Patient states she has had a HIDA scan that was unremarkable as well. ? ?Patient appears to have chronic intermittent right upper quadrant pain unclear etiology.  She states has been going on for years but worse in the last 2 to 3 months.  She has an appointment with her gastroenterologist for endoscopy in 2 weeks. ? ?CT abdomen pelvis pursued showing no acute pathology noted gallbladder appears normal. ? ? ? ? ? ? ? ?Final Clinical Impression(s) / ED Diagnoses ?Final diagnoses:  ?Abdominal pain, unspecified abdominal location  ? ? ?Rx / DC Orders ?ED Discharge Orders   ? ? None  ? ?  ? ? ?  ?Cheryll Cockayne, MD ?12/13/21 2227 ? ?  ?Cheryll Cockayne, MD ?12/13/21 2238 ? ?

## 2021-12-13 NOTE — Discharge Instructions (Signed)
On CT scan your gallbladder appears normal.  No wall thickening no inflammation noted.  No clear cause for your abdominal pain seen. ? ?Continue your appointment with the gastroenterologist.  Return to the ER if you have fevers, vomiting or any additional symptoms ?

## 2021-12-13 NOTE — ED Triage Notes (Signed)
Pt from home reports what she describes as "gallbladder pain" and nausea x a few days. Has had [place] in the past which she reports showed some stones.  No fevers.  Pt reports she is in a pain clinic and does not want any pain medications.  ?

## 2021-12-13 NOTE — ED Provider Triage Note (Signed)
Emergency Medicine Provider Triage Evaluation Note ? ?Pamela Bailey , a 41 y.o. female  was evaluated in triage.  Pt complains of ruq abd pain ongoing for several months. Sxs are worse this week. Associated nausea and pain with eating. ? ?Review of Systems  ?Positive: Ruq abd pain, nausea ?Negative: fevers ? ?Physical Exam  ?BP 121/88 (BP Location: Left Arm)   Pulse 86   Temp 98.1 ?F (36.7 ?C) (Oral)   Resp 18   LMP 06/03/2014   SpO2 97%  ?Gen:   Awake, no distress   ?Resp:  Normal effort  ?MSK:   Moves extremities without difficulty  ?Other:  Epigastric, ruq, luq TTP ? ?Medical Decision Making  ?Medically screening exam initiated at 5:28 PM.  Appropriate orders placed.  Pamela Bailey was informed that the remainder of the evaluation will be completed by another provider, this initial triage assessment does not replace that evaluation, and the importance of remaining in the ED until their evaluation is complete. ? ? ?  ?Karrie Meres, PA-C ?12/13/21 1729 ? ?

## 2021-12-22 ENCOUNTER — Telehealth: Payer: Self-pay | Admitting: Gastroenterology

## 2021-12-22 NOTE — Telephone Encounter (Signed)
Patient called to cancel EGD scheduled for tomorrow. Per patient, son was admitted into the hospital and will call back to reschedule her procedure with [place].  ?

## 2021-12-23 ENCOUNTER — Encounter: Payer: Medicaid Other | Admitting: Gastroenterology

## 2022-02-10 ENCOUNTER — Ambulatory Visit (INDEPENDENT_AMBULATORY_CARE_PROVIDER_SITE_OTHER): Payer: Medicaid Other | Admitting: Obstetrics and Gynecology

## 2022-02-10 ENCOUNTER — Other Ambulatory Visit (HOSPITAL_COMMUNITY)
Admission: RE | Admit: 2022-02-10 | Discharge: 2022-02-10 | Disposition: A | Discharge status: Home / Self Care | Payer: Medicaid Other | Source: Ambulatory Visit | Attending: Obstetrics and Gynecology | Admitting: Obstetrics and Gynecology

## 2022-02-10 ENCOUNTER — Encounter: Payer: Self-pay | Admitting: Obstetrics and Gynecology

## 2022-02-10 VITALS — BP 120/80 | Ht 62.0 in | Wt 85.0 lb

## 2022-02-10 DIAGNOSIS — Z01419 Encounter for gynecological examination (general) (routine) without abnormal findings: Secondary | ICD-10-CM

## 2022-02-10 DIAGNOSIS — R102 Pelvic and perineal pain: Secondary | ICD-10-CM | POA: Diagnosis not present

## 2022-02-10 MED ORDER — NORETHINDRONE 0.35 MG PO TABS: 1.0000 | ORAL_TABLET | Freq: Every day | ORAL | 11 refills | Status: DC

## 2022-02-10 NOTE — Progress Notes (Signed)
Subjective:     Pamela Bailey is a 41 y.o. female P2 with BMI 15 who is here for a comprehensive physical exam. The patient reports chronic pelvic pain which seems to have worsened since her last encounter with [place] in march. She reports some significant dyspareunia. She has a known history of endometriosis and had a TAH in 2015. Patient denies abnormal discharge. She denies urinary symptoms or incontinence. Patient is without any other complaints. Patient did not have a mammogram last year as she was caring for her father. She plans on keeping appointment this year.   Past Medical History:  Diagnosis Date   Abnormal Pap smear    colpo 5/09   Anxiety    Asthma    Cleft hard palate    Cleft lip    Depression    Endometriosis    Facial palsy    Headache(784.0)    Ovarian cyst    PONV (postoperative nausea and vomiting)    Past Surgical History:  Procedure Laterality Date   ABDOMINAL HYSTERECTOMY     ABDOMINAL SURGERY     COSMETIC SURGERY     LAPAROSCOPY     SALPINGECTOMY     TYMPANOSTOMY TUBE PLACEMENT     Family History  Problem Relation Age of Onset   Chronic fatigue Mother    Stroke Father    Colon cancer Maternal Grandmother    Hypertension Maternal Grandfather    Anesthesia problems Neg Hx    Social History   Socioeconomic History   Marital status: Single    Spouse name: Not on file   Number of children: Not on file   Years of education: Not on file   Highest education level: Not on file  Occupational History   Not on file  Tobacco Use   Smoking status: Every Day    Packs/day: 0.50    Years: 8.00    Pack years: 4.00    Types: Cigarettes   Smokeless tobacco: Never  Vaping Use   Vaping Use: Some days   Substances: Nicotine, Flavoring  Substance and Sexual Activity   Alcohol use: Yes    Comment: 1-2 drinks per week   Drug use: No   Sexual activity: Yes    Birth control/protection: Surgical    Comment: Hysterectomy  Other Topics Concern   Not on file   Social History Narrative   Drinks about 1 cup of coffee a day    Social Determinants of Health   Financial Resource Strain: Not on file  Food Insecurity: Not on file  Transportation Needs: Not on file  Physical Activity: Not on file  Stress: Not on file  Social Connections: Not on file  Intimate Partner Violence: Not on file   Health Maintenance  Topic Date Due   COVID-19 Vaccine (1) Never done   TETANUS/TDAP  03/24/2022   INFLUENZA VACCINE  04/20/2022   PAP SMEAR-Modifier  11/24/2025   Hepatitis C Screening  Completed   HIV Screening  Completed   HPV VACCINES  Aged Out       Review of Systems Pertinent items noted in HPI and remainder of comprehensive ROS otherwise negative.   Objective:  Blood pressure 120/80, height 5\' 2"  (1.575 m), weight 85 lb (38.6 kg), last menstrual period 06/03/2014.   GENERAL: Well-developed, well-nourished female in no acute distress.  HEENT: Normocephalic, atraumatic. Sclerae anicteric.  NECK: Supple. Normal thyroid.  LUNGS: Clear to auscultation bilaterally.  HEART: Regular rate and rhythm. BREASTS: Symmetric in size. No  palpable masses or lymphadenopathy, skin changes, or nipple drainage. ABDOMEN: Soft, nontender, nondistended. No organomegaly. PELVIC: Normal external female genitalia. Vagina is pink and rugated.  Normal discharge. Vaginal vault intact. Point tenderness in the suprapubic region. No adnexal mass or tenderness. Chaperone present during the pelvic exam EXTREMITIES: No cyanosis, clubbing, or edema, 2+ distal pulses.     Assessment:    Healthy female exam.      Plan:    Pap smear collected per patient request but no longer indicated given no history of abnormal pap smear and normal pap smear last year Screening mammogram ordered Pelvic ultrasound ordered to evaluate pelvic pain Discussed hormonal contraception vs Orilissa for endometriosis management. Patient opted for contraception. Rx Lyza provided to manage  endometriosis Patient will be referred to urology to rule out interstitial cystits given suprapubic tenderness. Urine culture also collected Patient will be contacted with abnormal results See After Visit Summary for Counseling Recommendations

## 2022-02-12 LAB — URINE CULTURE: Organism ID, Bacteria: NO GROWTH

## 2022-02-12 LAB — CYTOLOGY - PAP: Diagnosis: NEGATIVE

## 2022-02-18 ENCOUNTER — Ambulatory Visit (INDEPENDENT_AMBULATORY_CARE_PROVIDER_SITE_OTHER): Payer: Medicaid Other | Admitting: Urology

## 2022-02-18 ENCOUNTER — Encounter: Payer: Self-pay | Admitting: Urology

## 2022-02-18 VITALS — BP 103/73 | HR 103 | Ht 60.0 in | Wt 85.0 lb

## 2022-02-18 DIAGNOSIS — R102 Pelvic and perineal pain: Secondary | ICD-10-CM | POA: Diagnosis not present

## 2022-02-18 DIAGNOSIS — N3020 Other chronic cystitis without hematuria: Secondary | ICD-10-CM

## 2022-02-18 DIAGNOSIS — R3129 Other microscopic hematuria: Secondary | ICD-10-CM

## 2022-02-18 LAB — MICROSCOPIC EXAMINATION

## 2022-02-18 LAB — URINALYSIS, COMPLETE
Bilirubin, UA: NEGATIVE
Glucose, UA: NEGATIVE
Ketones, UA: NEGATIVE
Leukocytes,UA: NEGATIVE
Nitrite, UA: NEGATIVE
Specific Gravity, UA: 1.025 (ref 1.005–1.030)
Urobilinogen, Ur: 0.2 mg/dL (ref 0.2–1.0)
pH, UA: 6.5 (ref 5.0–7.5)

## 2022-02-18 LAB — BLADDER SCAN AMB NON-IMAGING: Scan Result: 0

## 2022-02-18 NOTE — Progress Notes (Signed)
02/18/2022 9:28 AM   Ralph Leyden Cu Aug 04, 1981 [id number]  Referring provider: Catalina Antigua, MD [address] Floor [place],  [place] [address]  Chief Complaint  Patient presents with   Cystitis    HPI: Pamela Bailey is a 41 y.o. female referred for evaluation of possible interstitial cystitis.  History of chronic pelvic and abdominal pain On recent GYN visit noted to have bladder tenderness She has no voiding symptoms including frequency, urgency, dysuria, bladder spasm or bladder pressure No prior history of urologic problems Prior history endometriosis   PMH: Past Medical History:  Diagnosis Date   Abnormal Pap smear    colpo 5/09   Anxiety    Asthma    Cleft hard palate    Cleft lip    Depression    Endometriosis    Facial palsy    Headache(784.0)    Ovarian cyst    PONV (postoperative nausea and vomiting)     Surgical History: Past Surgical History:  Procedure Laterality Date   ABDOMINAL HYSTERECTOMY     ABDOMINAL SURGERY     COSMETIC SURGERY     LAPAROSCOPY     SALPINGECTOMY     TYMPANOSTOMY TUBE PLACEMENT      Home Medications:  Allergies as of 02/18/2022       Reactions   Azithromycin Other (See Comments)   Caused renal failure (On 11/19/16, patient states this did NOT??)   Vancomycin Other (See Comments)   Acute kidney injury   Oxycodone Nausea And Vomiting   Amoxicillin-pot Clavulanate Nausea And Vomiting, Rash, Other (See Comments)   Has patient had a PCN reaction causing immediate rash, facial/tongue/throat swelling, SOB or lightheadedness with hypotension: Yes Has patient had a PCN reaction causing severe rash involving mucus membranes or skin necrosis: No Has patient had a PCN reaction that required hospitalization No Has patient had a PCN reaction occurring within the last 10 years: No If all of the above answers are "NO", then may proceed with Cephalosporin use.        Medication List        Accurate as of February 18, 2022  9:28 AM. If you have any questions, ask your nurse or doctor.          STOP taking these medications    buPROPion ER 100 MG 12 hr tablet Commonly known as: WELLBUTRIN SR Stopped by: Riki Altes, MD   dicyclomine 20 MG tablet Commonly known as: BENTYL Stopped by: Riki Altes, MD       TAKE these medications    albuterol 108 (90 Base) MCG/ACT inhaler Commonly known as: VENTOLIN HFA Inhale into the lungs.   ALPRAZolam 1 MG tablet Commonly known as: XANAX Take 1 mg by mouth 3 (three) times daily as needed for anxiety.   escitalopram 10 MG tablet Commonly known as: LEXAPRO Take 10 mg by mouth daily.   HYDROcodone-acetaminophen 10-325 MG tablet Commonly known as: NORCO Take 0.5 tablets by mouth 4 (four) times daily as needed.   norethindrone 0.35 MG tablet Commonly known as: MICRONOR Take 1 tablet (0.35 mg total) by mouth daily.   Vitamin D (Ergocalciferol) 1.25 MG (50000 UNIT) Caps capsule Commonly known as: DRISDOL Take 50,000 Units by mouth once a week.        Allergies:  Allergies  Allergen Reactions   Azithromycin Other (See Comments)    Caused renal failure (On 11/19/16, patient states this did NOT??)   Vancomycin Other (See Comments)  Acute kidney injury    Oxycodone Nausea And Vomiting   Amoxicillin-Pot Clavulanate Nausea And Vomiting, Rash and Other (See Comments)    Has patient had a PCN reaction causing immediate rash, facial/tongue/throat swelling, SOB or lightheadedness with hypotension: Yes Has patient had a PCN reaction causing severe rash involving mucus membranes or skin necrosis: No Has patient had a PCN reaction that required hospitalization No Has patient had a PCN reaction occurring within the last 10 years: No If all of the above answers are "NO", then may proceed with Cephalosporin use.     Family History: Family History  Problem Relation Age of Onset   Chronic fatigue Mother    Stroke Father    Colon cancer  Maternal Grandmother    Hypertension Maternal Grandfather    Anesthesia problems Neg Hx     Social History:  reports that she has been smoking cigarettes. She has a 4.00 pack-year smoking history. She has never used smokeless tobacco. She reports current alcohol use. She reports that she does not use drugs.   Physical Exam: BP 103/73   Pulse (!) 103   Ht 5' (1.524 m)   Wt 85 lb (38.6 kg)   LMP 06/03/2014   BMI 16.60 kg/m   Constitutional:  Alert, no acute distress. HEENT: [place] AT Respiratory: Normal respiratory effort, no increased work of breathing. GI: Abdomen is soft, mild upper quadrant and lower quadrant tenderness.  Mild suprapubic tenderness GU: Mild CVA tenderness Skin: No rashes, bruises or suspicious lesions. Neurologic: Grossly intact, no focal deficits, moving all 4 extremities. Psychiatric: Normal mood and affect.  Laboratory Data:  Urinalysis Dipstick 2+ blood/1+ protein Microscopy 3-10 RBC/0-10 epis   Assessment & Plan:    1.  Pelvic pain She has no voiding symptoms or bladder pressure making the diagnosis of interstitial cystitis unlikely PVR today 0 mL  2.  Microhematuria UA today with 3-10 RBCs on microscopy Urine results returned after patient left the office She was contacted with recommendation of CT urogram and cystoscopy Based on age and degree of hematuria AUA risk stratification is moderate however since she has chronic abdominal and pelvic pain will order CTU in lieu of renal ultrasound    Riki Altes, MD  [facility] [address] [place], [place] [address] [phone]

## 2022-02-19 ENCOUNTER — Telehealth: Payer: Self-pay | Admitting: *Deleted

## 2022-02-19 NOTE — Telephone Encounter (Signed)
-----   Message from Riki Altes, MD sent at 02/19/2022  7:45 AM EDT ----- Please let patient know her UA did show microscopic hematuria.  Recommend scheduling a CT urogram.  I will put in the order.  Please schedule cystoscopy with CT report.

## 2022-02-19 NOTE — Telephone Encounter (Signed)
Notified patient as instructed, patient pleased. Discussed follow-up appointments, patient agrees  

## 2022-02-21 ENCOUNTER — Encounter: Payer: Self-pay | Admitting: Urology

## 2022-02-26 ENCOUNTER — Ambulatory Visit
Admission: RE | Admit: 2022-02-26 | Discharge: 2022-02-26 | Disposition: A | Discharge status: Home / Self Care | Payer: Medicaid Other | Source: Ambulatory Visit | Attending: Obstetrics and Gynecology | Admitting: Obstetrics and Gynecology

## 2022-02-26 DIAGNOSIS — R102 Pelvic and perineal pain: Secondary | ICD-10-CM | POA: Insufficient documentation

## 2022-03-15 ENCOUNTER — Ambulatory Visit: Admission: RE | Admit: 2022-03-15 | Payer: Medicaid Other | Source: Ambulatory Visit

## 2022-03-16 ENCOUNTER — Inpatient Hospital Stay: Admission: RE | Admit: 2022-03-16 | Payer: Medicaid Other | Source: Ambulatory Visit

## 2022-04-01 ENCOUNTER — Other Ambulatory Visit: Payer: Medicaid Other | Admitting: Urology

## 2022-04-02 ENCOUNTER — Ambulatory Visit
Admission: RE | Admit: 2022-04-02 | Discharge: 2022-04-02 | Disposition: A | Discharge status: Home / Self Care | Payer: Medicaid Other | Source: Ambulatory Visit | Attending: Urology | Admitting: Urology

## 2022-04-02 DIAGNOSIS — R3129 Other microscopic hematuria: Secondary | ICD-10-CM | POA: Diagnosis not present

## 2022-04-02 MED ORDER — IOHEXOL 300 MG/ML  SOLN
100.0000 mL | Freq: Once | INTRAMUSCULAR | Status: AC | PRN
  Administered 2022-04-02: 100 mL via INTRAVENOUS

## 2022-04-12 ENCOUNTER — Ambulatory Visit: Payer: Medicaid Other | Admitting: Urology

## 2022-04-12 ENCOUNTER — Telehealth: Payer: Self-pay | Admitting: *Deleted

## 2022-04-12 ENCOUNTER — Encounter: Payer: Self-pay | Admitting: Urology

## 2022-04-12 VITALS — BP 112/79 | HR 96 | Ht 62.0 in | Wt 85.0 lb

## 2022-04-12 DIAGNOSIS — R3129 Other microscopic hematuria: Secondary | ICD-10-CM

## 2022-04-12 LAB — URINALYSIS, COMPLETE
Bilirubin, UA: NEGATIVE
Glucose, UA: NEGATIVE
Ketones, UA: NEGATIVE
Leukocytes,UA: NEGATIVE
Nitrite, UA: NEGATIVE
Protein,UA: NEGATIVE
Specific Gravity, UA: 1.02 (ref 1.005–1.030)
Urobilinogen, Ur: 0.2 mg/dL (ref 0.2–1.0)
pH, UA: 7 (ref 5.0–7.5)

## 2022-04-12 LAB — MICROSCOPIC EXAMINATION

## 2022-04-12 NOTE — Telephone Encounter (Signed)
-----   Message from Riki Altes, MD sent at 04/12/2022  2:18 PM EDT ----- Regarding: Medication Please let patient know the medication I was going to prescribe for her pelvic pain has a potential interaction with Lexapro so Rx was not sent

## 2022-04-12 NOTE — Telephone Encounter (Signed)
Notified patient as instructed.   

## 2022-04-12 NOTE — Progress Notes (Signed)
   04/12/22  CC:  Chief Complaint  Patient presents with   Cysto    HPI: Initially seen 02/18/2022 for bladder pain/pressure without lower urinary tract symptoms.  UA showed 3-10 RBCs.  CTU and cystoscopy was recommended.  CTU was unremarkable.  She presents today for cystoscopy.  Still having pelvic pressure  Blood pressure 112/79, pulse 96, height 5\' 2"  (1.575 m), weight 85 lb (38.6 kg), last menstrual period 06/03/2014. NED. A&Ox3.   No respiratory distress   Abd soft, NT, ND Normal external genitalia with patent urethral meatus.  Mild bladder tenderness  Cystoscopy Procedure Note  Patient identification was confirmed, informed consent was obtained, and patient was prepped using Betadine solution.  Lidocaine jelly was administered per urethral meatus.    Procedure: - Flexible cystoscope introduced, without any difficulty.   - Thorough search of the bladder revealed:    normal urethral meatus    normal urothelium    no stones    no ulcers     no tumors    no urethral polyps    no trabeculation  - Ureteral orifices were normal in position and appearance.  Post-Procedure: - Patient tolerated the procedure well  Assessment/ Plan: No abnormalities on CTU/cystoscopy Discussed a trial of low-dose amitriptyline to see if there is any impact on her pelvic pain though drug interaction with Lexapro   06/05/2014, MD

## 2022-04-15 ENCOUNTER — Ambulatory Visit
Admission: RE | Admit: 2022-04-15 | Discharge: 2022-04-15 | Disposition: A | Discharge status: Home / Self Care | Payer: Medicaid Other | Source: Ambulatory Visit | Attending: Obstetrics and Gynecology | Admitting: Obstetrics and Gynecology

## 2022-04-15 DIAGNOSIS — Z01419 Encounter for gynecological examination (general) (routine) without abnormal findings: Secondary | ICD-10-CM

## 2022-04-15 DIAGNOSIS — Z1231 Encounter for screening mammogram for malignant neoplasm of breast: Secondary | ICD-10-CM | POA: Diagnosis present

## 2022-04-18 ENCOUNTER — Emergency Department (HOSPITAL_COMMUNITY)
Admission: EM | Admit: 2022-04-18 | Discharge: 2022-04-18 | Disposition: A | Discharge status: Home / Self Care | Payer: Medicaid Other | Attending: Emergency Medicine | Admitting: Emergency Medicine

## 2022-04-18 ENCOUNTER — Encounter (HOSPITAL_COMMUNITY): Payer: Self-pay

## 2022-04-18 ENCOUNTER — Emergency Department (HOSPITAL_COMMUNITY): Payer: Medicaid Other

## 2022-04-18 DIAGNOSIS — M79652 Pain in left thigh: Secondary | ICD-10-CM | POA: Diagnosis not present

## 2022-04-18 DIAGNOSIS — G8929 Other chronic pain: Secondary | ICD-10-CM | POA: Diagnosis not present

## 2022-04-18 DIAGNOSIS — R1011 Right upper quadrant pain: Secondary | ICD-10-CM | POA: Insufficient documentation

## 2022-04-18 LAB — URINALYSIS, ROUTINE W REFLEX MICROSCOPIC
Bacteria, UA: NONE SEEN
Bilirubin Urine: NEGATIVE
Glucose, UA: NEGATIVE mg/dL
Ketones, ur: NEGATIVE mg/dL
Leukocytes,Ua: NEGATIVE
Nitrite: NEGATIVE
Protein, ur: NEGATIVE mg/dL
Specific Gravity, Urine: 1.011 (ref 1.005–1.030)
pH: 6 (ref 5.0–8.0)

## 2022-04-18 LAB — CBC
HCT: 44.1 % (ref 36.0–46.0)
Hemoglobin: 14.9 g/dL (ref 12.0–15.0)
MCH: 32.7 pg (ref 26.0–34.0)
MCHC: 33.8 g/dL (ref 30.0–36.0)
MCV: 96.7 fL (ref 80.0–100.0)
Platelets: 225 10*3/uL (ref 150–400)
RBC: 4.56 MIL/uL (ref 3.87–5.11)
RDW: 12.7 % (ref 11.5–15.5)
WBC: 14.7 10*3/uL — ABNORMAL HIGH (ref 4.0–10.5)
nRBC: 0 % (ref 0.0–0.2)

## 2022-04-18 LAB — COMPREHENSIVE METABOLIC PANEL
ALT: 16 U/L (ref 0–44)
AST: 18 U/L (ref 15–41)
Albumin: 4.5 g/dL (ref 3.5–5.0)
Alkaline Phosphatase: 76 U/L (ref 38–126)
Anion gap: 8 (ref 5–15)
BUN: 19 mg/dL (ref 6–20)
CO2: 26 mmol/L (ref 22–32)
Calcium: 9.3 mg/dL (ref 8.9–10.3)
Chloride: 107 mmol/L (ref 98–111)
Creatinine, Ser: 0.59 mg/dL (ref 0.44–1.00)
GFR, Estimated: >60 mL/min (ref >60)
Glucose, Bld: 86 mg/dL (ref 70–99)
Potassium: 4.2 mmol/L (ref 3.5–5.1)
Sodium: 141 mmol/L (ref 135–145)
Total Bilirubin: 0.3 mg/dL (ref 0.3–1.2)
Total Protein: 7.7 g/dL (ref 6.5–8.1)

## 2022-04-18 LAB — LIPASE, BLOOD: Lipase: 34 U/L (ref 11–51)

## 2022-04-18 NOTE — Discharge Instructions (Addendum)
IMPORTANT PATIENT INSTRUCTIONS:  You have been scheduled for an Outpatient Vascular Study at [facility].    If tomorrow is a Saturday, Sunday or holiday, please go to the [facility] Emergency Department Registration Desk at 11 am tomorrow morning and tell them you are there for a vascular study.   If tomorrow is a weekday (Monday-Friday), please go to [facility] Registration at 11 am and tell them you are there for a vascular study.  If you develop worsening, continued, or recurrent abdominal pain, uncontrolled vomiting, fever, chest or back pain, or any other new/concerning symptoms then return to the ER for evaluation.

## 2022-04-18 NOTE — ED Triage Notes (Addendum)
Patient c/o RUQ abdominal since yesterday. Patient also endorses nausea, but no vomiting or diarrhea. Patent states the pain radiates into her back and right shoulder, and left leg. Patient reports that she has had gallbladder issues and the last being 2 months ago.

## 2022-04-18 NOTE — ED Provider Notes (Signed)
[facility]-EMERGENCY DEPT Provider Note   CSN: [id number] Arrival date & time: 04/18/22  1504     History  Chief Complaint  Patient presents with   Abdominal Pain    Pamela Bailey is a 41 y.o. female.  HPI 41 year old female presents with a recurrent flare of right upper quadrant pain.  Started yesterday.  She has had this multiple times before and feels like she should have her gallbladder removed.  No vomiting or fevers.  It wraps around a little bit to her back.  No urinary symptoms.  Had a cystoscopy for hematuria last week and is concerned something happened to her leg as since yesterday evening she has also had some soreness like a charley horse to her left proximal thigh.  No leg swelling or injury. She is on chronic pain management.  Home Medications Prior to Admission medications   Medication Sig Start Date End Date Taking? Authorizing Provider  albuterol (VENTOLIN HFA) 108 (90 Base) MCG/ACT inhaler Inhale 2 puffs into the lungs every 6 (six) hours as needed for wheezing or shortness of breath (or asthma flares).   Yes [provider]  ALPRAZolam Prudy Feeler) 1 MG tablet Take 1 mg by mouth 3 (three) times daily as needed for anxiety.    Yes [provider]  cyclobenzaprine (FLEXERIL) 10 MG tablet Take 10 mg by mouth 3 (three) times daily as needed for muscle spasms.   Yes [provider]  escitalopram (LEXAPRO) 20 MG tablet Take 20 mg by mouth at bedtime.   Yes [provider]  HYDROcodone-acetaminophen (NORCO) 10-325 MG tablet Take 1 tablet by mouth 3 (three) times daily as needed (for pain). 11/10/21  Yes [provider]  ondansetron (ZOFRAN-ODT) 8 MG disintegrating tablet Take 8 mg by mouth every 8 (eight) hours as needed for nausea or vomiting (dissolve orally).   Yes [provider]  Vitamin D, Ergocalciferol, (DRISDOL) 1.25 MG (50000 UNIT) CAPS capsule Take 50,000 Units by mouth once a week. 09/29/20   Yes [provider]  norethindrone (MICRONOR) 0.35 MG tablet Take 1 tablet (0.35 mg total) by mouth daily. Patient not taking: Reported on 04/18/2022 02/10/22   Constant, Peggy, MD      Allergies    Azithromycin, Vancomycin, Oxycodone, and Amoxicillin-pot clavulanate    Review of Systems   Review of Systems  Constitutional:  Negative for fever.  Gastrointestinal:  Positive for abdominal pain. Negative for vomiting.  Genitourinary:  Positive for flank pain.  Musculoskeletal:  Positive for myalgias.    Physical Exam Updated Vital Signs BP 110/68   Pulse 74   Temp 98 F (36.7 C)   Resp 16   Ht 5\' 2"  (1.575 m)   Wt 37.6 kg   LMP 06/03/2014   SpO2 99%   BMI 15.18 kg/m  Physical Exam Vitals and nursing note reviewed.  Constitutional:      Appearance: She is well-developed.  HENT:     Head: Normocephalic and atraumatic.  Cardiovascular:     Rate and Rhythm: Normal rate and regular rhythm.     Pulses:          Posterior tibial pulses are 2+ on the left side.     Heart sounds: Normal heart sounds.  Pulmonary:     Effort: Pulmonary effort is normal.     Breath sounds: Normal breath sounds.  Abdominal:     Palpations: Abdomen is soft.     Tenderness: There is abdominal tenderness in the right  upper quadrant.  Musculoskeletal:       Legs:  Skin:    General: Skin is warm and dry.  Neurological:     Mental Status: She is alert.     ED Results / Procedures / Treatments   Labs (all labs ordered are listed, but only abnormal results are displayed) Labs Reviewed  CBC - Abnormal; Notable for the following components:      Result Value   WBC 14.7 (*)    All other components within normal limits  URINALYSIS, ROUTINE W REFLEX MICROSCOPIC - Abnormal; Notable for the following components:   Color, Urine STRAW (*)    Hgb urine dipstick MODERATE (*)    All other components within normal limits  LIPASE, BLOOD  COMPREHENSIVE METABOLIC PANEL     EKG None  Radiology US Abdomen Limited RUQ (LIVER/GB)  Result Date: 04/18/2022 CLINICAL DATA:  Right upper quadrant pain for 2 days. EXAM: ULTRASOUND ABDOMEN LIMITED RIGHT UPPER QUADRANT COMPARISON:  CT 04/02/2022 FINDINGS: Gallbladder: No gallstones or wall thickening visualized. No sonographic Murphy sign noted by sonographer. Common bile duct: Diameter: 4 mm, normal Liver: No focal lesion identified. Within normal limits in parenchymal echogenicity. Portal vein is patent on color Doppler imaging with normal direction of blood flow towards the liver. Other: None. IMPRESSION: Normal examination. No evidence of cholelithiasis or acute cholecystitis. Electronically Signed   By: Burman Nieves M.D.   On: 04/18/2022 20:17    Procedures Procedures    Medications Ordered in ED Medications - No data to display  ED Course/ Medical Decision Making/ A&P                           Medical Decision Making Amount and/or Complexity of Data Reviewed Labs: ordered. Radiology: ordered.   Patient has focal RUQ tenderness, but is mild.  Chart review indicates as well as patient indicates, this is a chronic problem.  She is convinced she needs to have her gallbladder out.  However right upper quadrant ultrasound is unremarkable today.  I personally viewed/interpreted these images.  I do not think a CT would be helpful.  She does have a leukocytosis which she has had intermittently before with negative work-ups.  Is a little higher than before but again she looks quite well and has normal vitals and no other abnormal findings including normal LFTs on work-up.  As far as her left thigh pain.  Is very point tender and she feels like she has a charley horse in that area.  Could be a DVT though this would be a little odd given no further swelling or discomfort.  At this point there is no ultrasound here tonight so we will arrange for this to occur tomorrow and she will have to come back.  Otherwise I  suspect is more of a muscle spasm or pain.  She was given return precautions and at this time appears stable for discharge home.        Final Clinical Impression(s) / ED Diagnoses Final diagnoses:  Abdominal pain, chronic, right upper quadrant  Left thigh pain    Rx / DC Orders ED Discharge Orders          Ordered    LE VENOUS        04/18/22 1905              Pricilla Loveless, MD 04/18/22 2349

## 2022-04-18 NOTE — ED Provider Triage Note (Signed)
Emergency Medicine Provider Triage Evaluation Note  Judi Cong , a 41 y.o. female  was evaluated in triage.  Pt complains of RUQ abdominal pain since yesterday and left sided groin pain. Hx of gallbladder issues, previously followed with [facility]. Also had a cystoscopy recently and having some discomfort since. Some nausea, no vomiting. Does not want a CT scan unless absolutely necessary.   Review of Systems  Positive: As above Negative: Fever, diarrhea, dysuria  Physical Exam  BP 123/81 (BP Location: Left Arm)   Pulse 85   Temp 98.4 F (36.9 C) (Oral)   Resp 16   Ht 5\' 2"  (1.575 m)   Wt 37.6 kg   LMP 06/03/2014   SpO2 100%   BMI 15.18 kg/m  Gen:   Awake, no distress   Resp:  Normal effort  MSK:   Moves extremities without difficulty  Other:    Medical Decision Making  Medically screening exam initiated at 4:48 PM.  Appropriate orders placed.  Shalin P Buttermore was informed that the remainder of the evaluation will be completed by another provider, this initial triage assessment does not replace that evaluation, and the importance of remaining in the ED until their evaluation is complete.     Verdie Wilms T, PA-C 04/18/22 1650

## 2022-04-19 ENCOUNTER — Encounter: Payer: Self-pay | Admitting: Gastroenterology

## 2022-04-19 ENCOUNTER — Ambulatory Visit (HOSPITAL_COMMUNITY): Admission: RE | Admit: 2022-04-19 | Payer: Medicaid Other | Source: Ambulatory Visit

## 2022-04-20 ENCOUNTER — Inpatient Hospital Stay
Admission: RE | Admit: 2022-04-20 | Discharge: 2022-04-20 | Disposition: A | Discharge status: Home / Self Care | Payer: Self-pay | Source: Ambulatory Visit | Attending: *Deleted | Admitting: *Deleted

## 2022-04-20 ENCOUNTER — Telehealth: Payer: Self-pay | Admitting: *Deleted

## 2022-04-20 ENCOUNTER — Other Ambulatory Visit: Payer: Self-pay | Admitting: *Deleted

## 2022-04-20 DIAGNOSIS — Z1231 Encounter for screening mammogram for malignant neoplasm of breast: Secondary | ICD-10-CM

## 2022-04-20 NOTE — Telephone Encounter (Signed)
Dr Christella Hartigan,  You saw this pt in the office 12-09-2021 for RUQ abdominal pain- she had an EGD scheduled for 12-22-21 and she cancelled that .   She is now back on the schedule for an EGD 06-02-22. She was seen in the ED 04-18-22  For abdominal pain.  Can we proceed as scheduled or does she need another OV?  Please advise,  Thanks , Elizebeth Brooking

## 2022-04-22 NOTE — Telephone Encounter (Signed)
Chart reviewed I think it is ok for her to have the EGD -  Pamela Bailey will need to be moved to a different MD due to Dr. Christella Hartigan' absence - if I have availability that day I can do her EGD

## 2022-04-23 NOTE — Telephone Encounter (Signed)
Patient notified, offered to keep OV or cancel, pt wanted to cancel OV and EGD rescheduled same date/time with Dr.Gessner-pt ok with seeing Dr.Gessner. PV still on for 8/24 at [address]-pt is aware.

## 2022-05-13 ENCOUNTER — Ambulatory Visit: Payer: Medicaid Other | Admitting: Nurse Practitioner

## 2022-05-13 ENCOUNTER — Ambulatory Visit (AMBULATORY_SURGERY_CENTER): Payer: Medicaid Other | Admitting: *Deleted

## 2022-05-13 VITALS — Ht 60.0 in | Wt 85.0 lb

## 2022-05-13 DIAGNOSIS — R1011 Right upper quadrant pain: Secondary | ICD-10-CM

## 2022-05-13 NOTE — Progress Notes (Signed)
Patient's pre-visit was done today over the phone with the patient. Name,DOB and address verified. Patient denies any allergies to Eggs and Soy. Patient denies any problems with anesthesia/sedation. Patient is not taking any diet pills or blood thinners. No home Oxygen. Insurance confirmed with patient.  Went over prep instructions with patient. Prep instructions sent to pt's MyChart & mailed to pt-pt is aware. Patient understands to call us back with any questions or concerns. Patient is aware of our care-partner policy.     

## 2022-05-30 ENCOUNTER — Encounter: Payer: Self-pay | Admitting: Certified Registered Nurse Anesthetist

## 2022-06-01 ENCOUNTER — Telehealth: Payer: Self-pay | Admitting: Internal Medicine

## 2022-06-01 NOTE — Telephone Encounter (Signed)
Good Afternoon Dr. Leone Payor,  Patient stated that she needed to cancel her EGD with you tomorrow at 10:00 due to her son being discharged from the hospital tomorrow.  Patient stated she will call back to reschedule.

## 2022-06-01 NOTE — Telephone Encounter (Signed)
OK no charge ?

## 2022-06-02 ENCOUNTER — Encounter: Payer: Medicaid Other | Admitting: Internal Medicine

## 2022-06-02 ENCOUNTER — Encounter: Payer: Medicaid Other | Admitting: Gastroenterology

## 2022-08-04 ENCOUNTER — Ambulatory Visit: Payer: Medicaid Other

## 2022-10-04 ENCOUNTER — Emergency Department (HOSPITAL_COMMUNITY)
Admission: EM | Admit: 2022-10-04 | Discharge: 2022-10-04 | Discharge status: Against Medical Advice | Payer: Medicaid Other | Attending: Emergency Medicine | Admitting: Emergency Medicine

## 2022-10-04 DIAGNOSIS — M7918 Myalgia, other site: Secondary | ICD-10-CM | POA: Insufficient documentation

## 2022-10-04 DIAGNOSIS — R509 Fever, unspecified: Secondary | ICD-10-CM | POA: Insufficient documentation

## 2022-10-04 DIAGNOSIS — Z5321 Procedure and treatment not carried out due to patient leaving prior to being seen by health care provider: Secondary | ICD-10-CM | POA: Diagnosis not present

## 2022-10-04 DIAGNOSIS — R059 Cough, unspecified: Secondary | ICD-10-CM | POA: Insufficient documentation

## 2022-10-04 NOTE — ED Notes (Addendum)
Pt called multiple times no answer. Its a note saying pt is in Peds room 9 is not open.

## 2022-10-04 NOTE — ED Triage Notes (Signed)
Patient BIB [facility] from home for evaluation of cough, generalized body aches, and subjective fever for the last several days. Patient is alert, oriented, ambulating independently with steady gait.  EMS Vitals BP 154/102 HR  113 Sp2 96% on room air RR  16

## 2022-10-07 ENCOUNTER — Ambulatory Visit
Admission: EM | Admit: 2022-10-07 | Discharge: 2022-10-07 | Disposition: A | Discharge status: Home / Self Care | Payer: Medicaid Other | Attending: Emergency Medicine | Admitting: Emergency Medicine

## 2022-10-07 ENCOUNTER — Ambulatory Visit (INDEPENDENT_AMBULATORY_CARE_PROVIDER_SITE_OTHER): Payer: Medicaid Other

## 2022-10-07 DIAGNOSIS — F17200 Nicotine dependence, unspecified, uncomplicated: Secondary | ICD-10-CM

## 2022-10-07 DIAGNOSIS — J441 Chronic obstructive pulmonary disease with (acute) exacerbation: Secondary | ICD-10-CM | POA: Diagnosis not present

## 2022-10-07 DIAGNOSIS — R058 Other specified cough: Secondary | ICD-10-CM | POA: Insufficient documentation

## 2022-10-07 DIAGNOSIS — R051 Acute cough: Secondary | ICD-10-CM

## 2022-10-07 MED ORDER — ALBUTEROL SULFATE HFA 108 (90 BASE) MCG/ACT IN AERS: 1.0000 | INHALATION_SPRAY | Freq: Four times a day (QID) | RESPIRATORY_TRACT | 0 refills | Status: AC | PRN

## 2022-10-07 MED ORDER — DOXYCYCLINE HYCLATE 100 MG PO CAPS: 100.0000 mg | ORAL_CAPSULE | Freq: Two times a day (BID) | ORAL | 0 refills | Status: AC

## 2022-10-07 MED ORDER — PREDNISONE 10 MG PO TABS: 40.0000 mg | ORAL_TABLET | Freq: Every day | ORAL | 0 refills | Status: AC

## 2022-10-07 NOTE — Discharge Instructions (Addendum)
Schedule an appointment with your primary care provider for follow up.    Take the doxycycline and prednisone as directed.  Use the albuterol inhaler as directed.    Go to the emergency department if you have worsening symptoms.

## 2022-10-07 NOTE — ED Provider Notes (Signed)
Roderic Palau    CSN: [id number] Arrival date & time: 10/07/22  0831      History   Chief Complaint Chief Complaint  Patient presents with   Nasal Congestion   Cough    HPI Pamela Bailey is a 42 y.o. female.  Patient presents with congestion and productive cough x 1 week.  Her right side ribs are painful with coughing and deep breaths.  At the onset of her symptoms, she had fever and body aches but these have resolved.  Her son has Influenza A.  Patient is concerned for pneumonia as her current symptoms feel similar to past episode of pneumonia.  She denies current fever, chills, wheezing, shortness of breath, or other symptoms.  Treating symptoms with Mucinex.  Her medical history includes asthma, chronic fatigue, chronic low back pain, anxiety, depression, facial palsy.  Current everyday smoker.   The history is provided by the patient and medical records.    Past Medical History:  Diagnosis Date   Abnormal Pap smear    colpo 5/09   Anxiety    Asthma    Cleft hard palate    Cleft lip    Depression    Endometriosis    Facial palsy    Headache(784.0)    Ovarian cyst    PONV (postoperative nausea and vomiting)     Patient Active Problem List   Diagnosis Date Noted   Productive cough 10/07/2022   Encounter for medical examination to establish care 12/07/2017   Chronic midline low back pain without sciatica 09/09/2016   Weakness 06/21/2016   Left leg numbness 06/21/2016   Numbness of upper extremity 06/21/2016   Weakness of upper extremity 06/21/2016   Arm weakness 06/21/2016   Bilateral arm weakness    Hypokalemia    Leukocytosis    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   Chronic fatigue 07/16/2015   Anxiety 06/04/2015   Headache 08/30/2008   NAUSEA 08/30/2008   ABDOMINAL PAIN-MULTIPLE SITES 08/30/2008   RENAL FAILURE, ACUTE 08/29/2008    Past Surgical History:  Procedure Laterality Date   ABDOMINAL HYSTERECTOMY     ABDOMINAL SURGERY     BREAST BIOPSY Right  2015   neg   COSMETIC SURGERY     LAPAROSCOPY     SALPINGECTOMY     TYMPANOSTOMY TUBE PLACEMENT     UPPER GASTROINTESTINAL ENDOSCOPY      OB History     Gravida  2   Para  2   Term  2   Preterm      AB      Living  2      SAB      IAB      Ectopic      Multiple      Live Births  2            Home Medications    Prior to Admission medications   Medication Sig Start Date End Date Taking? Authorizing Provider  albuterol (VENTOLIN HFA) 108 (90 Base) MCG/ACT inhaler Inhale 1-2 puffs into the lungs every 6 (six) hours as needed. 10/07/22  Yes Sharion Balloon, NP  doxycycline (VIBRAMYCIN) 100 MG capsule Take 1 capsule (100 mg total) by mouth 2 (two) times daily for 7 days. 10/07/22 10/14/22 Yes Sharion Balloon, NP  predniSONE (DELTASONE) 10 MG tablet Take 4 tablets (40 mg total) by mouth daily for 5 days. 10/07/22 10/12/22 Yes Sharion Balloon, NP  ALPRAZolam Duanne Moron) 1 MG tablet Take 1 mg  by mouth 3 (three) times daily as needed for anxiety.     [provider]  cyclobenzaprine (FLEXERIL) 10 MG tablet Take 10 mg by mouth 3 (three) times daily as needed for muscle spasms.    [provider]  escitalopram (LEXAPRO) 20 MG tablet Take 20 mg by mouth at bedtime.    [provider]  HYDROcodone-acetaminophen (NORCO) 10-325 MG tablet Take 1 tablet by mouth 3 (three) times daily as needed (for pain). 11/10/21   [provider]  ondansetron (ZOFRAN-ODT) 8 MG disintegrating tablet Take 8 mg by mouth every 8 (eight) hours as needed for nausea or vomiting (dissolve orally).    [provider]  Vitamin D, Ergocalciferol, (DRISDOL) 1.25 MG (50000 UNIT) CAPS capsule Take 50,000 Units by mouth once a week. 09/29/20   [provider]    Family History Family History  Problem Relation Age of Onset   Chronic fatigue Mother    Stroke Father    Breast cancer Sister        1/2 sister paternal   Breast cancer Maternal Grandmother    Colon  cancer Maternal Grandmother    Hypertension Maternal Grandfather    Anesthesia problems Neg Hx    Esophageal cancer Neg Hx    Stomach cancer Neg Hx     Social History Social History   Tobacco Use   Smoking status: Every Day    Packs/day: 0.50    Years: 8.00    Total pack years: 4.00    Types: Cigarettes   Smokeless tobacco: Never  Vaping Use   Vaping Use: Some days   Substances: Nicotine, Flavoring  Substance Use Topics   Alcohol use: Yes    Comment: 1-2 drinks per week   Drug use: No     Allergies   Azithromycin, Vancomycin, Oxycodone, and Amoxicillin-pot clavulanate   Review of Systems Review of Systems  Constitutional:  Negative for chills and fever.  HENT:  Positive for congestion. Negative for ear pain and sore throat.   Respiratory:  Positive for cough. Negative for shortness of breath.   Cardiovascular:  Negative for chest pain and palpitations.  Gastrointestinal:  Negative for abdominal pain, diarrhea and vomiting.  Skin:  Negative for color change and rash.  All other systems reviewed and are negative.    Physical Exam Triage Vital Signs ED Triage Vitals  Enc Vitals Group     BP      Pulse      Resp      Temp      Temp src      SpO2      Weight      Height      Head Circumference      Peak Flow      Pain Score      Pain Loc      Pain Edu?      Excl. in [place]?    No data found.  Updated Vital Signs BP 118/76   Pulse (!) 107   Temp 99.3 F (37.4 C)   Resp 18   Ht 5\' 2"  (1.575 m)   Wt 85 lb (38.6 kg)   LMP 06/03/2014   SpO2 95%   BMI 15.55 kg/m   Visual Acuity Right Eye Distance:   Left Eye Distance:   Bilateral Distance:    Right Eye Near:   Left Eye Near:    Bilateral Near:     Physical Exam Vitals and nursing note reviewed.  Constitutional:  General: She is not in acute distress.    Appearance: Normal appearance. She is well-developed. She is not ill-appearing.  HENT:     Right Ear: Tympanic membrane normal.      Left Ear: Tympanic membrane normal.     Nose: Nose normal.     Mouth/Throat:     Mouth: Mucous membranes are moist.     Pharynx: Oropharynx is clear.  Cardiovascular:     Rate and Rhythm: Normal rate and regular rhythm.     Heart sounds: Normal heart sounds.  Pulmonary:     Effort: Pulmonary effort is normal. No respiratory distress.     Breath sounds: Normal breath sounds.  Musculoskeletal:     Cervical back: Neck supple.  Skin:    General: Skin is warm and dry.  Neurological:     Mental Status: She is alert.  Psychiatric:        Mood and Affect: Mood normal.        Behavior: Behavior normal.      UC Treatments / Results  Labs (all labs ordered are listed, but only abnormal results are displayed) Labs Reviewed - No data to display  EKG   Radiology DG Chest 2 View  Result Date: 10/07/2022 CLINICAL DATA:  Productive cough. Nasal congestion chest congestion. History of pneumonia. EXAM: CHEST - 2 VIEW COMPARISON:  Chest two views 06/01/2021 and 10/30/2016 FINDINGS: Cardiac silhouette and mediastinal contours are within normal limits. Flattening of the diaphragms and moderate to high-grade hyperinflation is unchanged. Significant lucent bullous change within the left lung apex is unchanged from 06/01/2022 but again increased from 10/30/2016 no definite acute airspace opacity. No pleural effusion pneumothorax. Mild multilevel degenerative disc changes of the thoracic spine. Minimal anterior wedging of multiple midthoracic vertebral bodies with resultant mildly increased kyphotic angulation, unchanged. IMPRESSION: 1. No acute cardiopulmonary process. 2. Moderate to high-grade hyperinflation consistent with emphysema is unchanged. 3. Significant lucent bullous change within the left lung apex is unchanged from 06/01/2022 but increased from 10/30/2016. Electronically Signed   By: Neita Garnet M.D.   On: 10/07/2022 09:09    Procedures Procedures (including critical care  time)  Medications Ordered in UC Medications - No data to display  Initial Impression / Assessment and Plan / UC Course  I have reviewed the triage vital signs and the nursing notes.  Pertinent labs & imaging results that were available during my care of the patient were reviewed by me and considered in my medical decision making (see chart for details).    COPD exacerbation, cough, current everyday smoker.  CXR shows no pneumonia, hyperinflation consistent with emphysema, bullous change in left lung apex.  Treating today with albuterol inhaler, prednisone, and doxycycline.  Discussed xray findings with patient and instructed her to schedule an appointment with her PCP to follow up.  ED precaution discussed.  Education provided on COPD exacerbation.  Patient agrees to plan of care.   Final Clinical Impressions(s) / UC Diagnoses   Final diagnoses:  COPD exacerbation (HCC)  Acute cough  Smoker     Discharge Instructions      Schedule an appointment with your primary care provider for follow up.    Take the doxycycline and prednisone as directed.  Use the albuterol inhaler as directed.    Go to the emergency department if you have worsening symptoms.         ED Prescriptions     Medication Sig Dispense Auth. Provider   albuterol (VENTOLIN HFA) 108 (90  Base) MCG/ACT inhaler Inhale 1-2 puffs into the lungs every 6 (six) hours as needed. 18 g Mickie Bail, NP   predniSONE (DELTASONE) 10 MG tablet Take 4 tablets (40 mg total) by mouth daily for 5 days. 20 tablet Mickie Bail, NP   doxycycline (VIBRAMYCIN) 100 MG capsule Take 1 capsule (100 mg total) by mouth 2 (two) times daily for 7 days. 14 capsule Mickie Bail, NP      PDMP not reviewed this encounter.   Mickie Bail, NP 10/07/22 0930

## 2022-10-07 NOTE — ED Triage Notes (Addendum)
Patient to Urgent Care with complaints of nasal congestion and chest congestion. Reports some pain on the left side of her chest when taking deep breaths. Hx of pneumonia, reports concerns for the same. Productive cough with discolored mucus.   Reports fevers and generalized body aches one week ago. Son tested positive for flu A on Monday.   Has been taking mucinex.

## 2022-10-12 IMAGING — DX DG KNEE COMPLETE 4+V*R*
4 series · 4 of 4 positions shown · non-contrast
Comparison: None.

CLINICAL DATA: Knee pain after fall 2 days ago

EXAM:
RIGHT KNEE - COMPLETE 4+ VIEW

[knee ap]
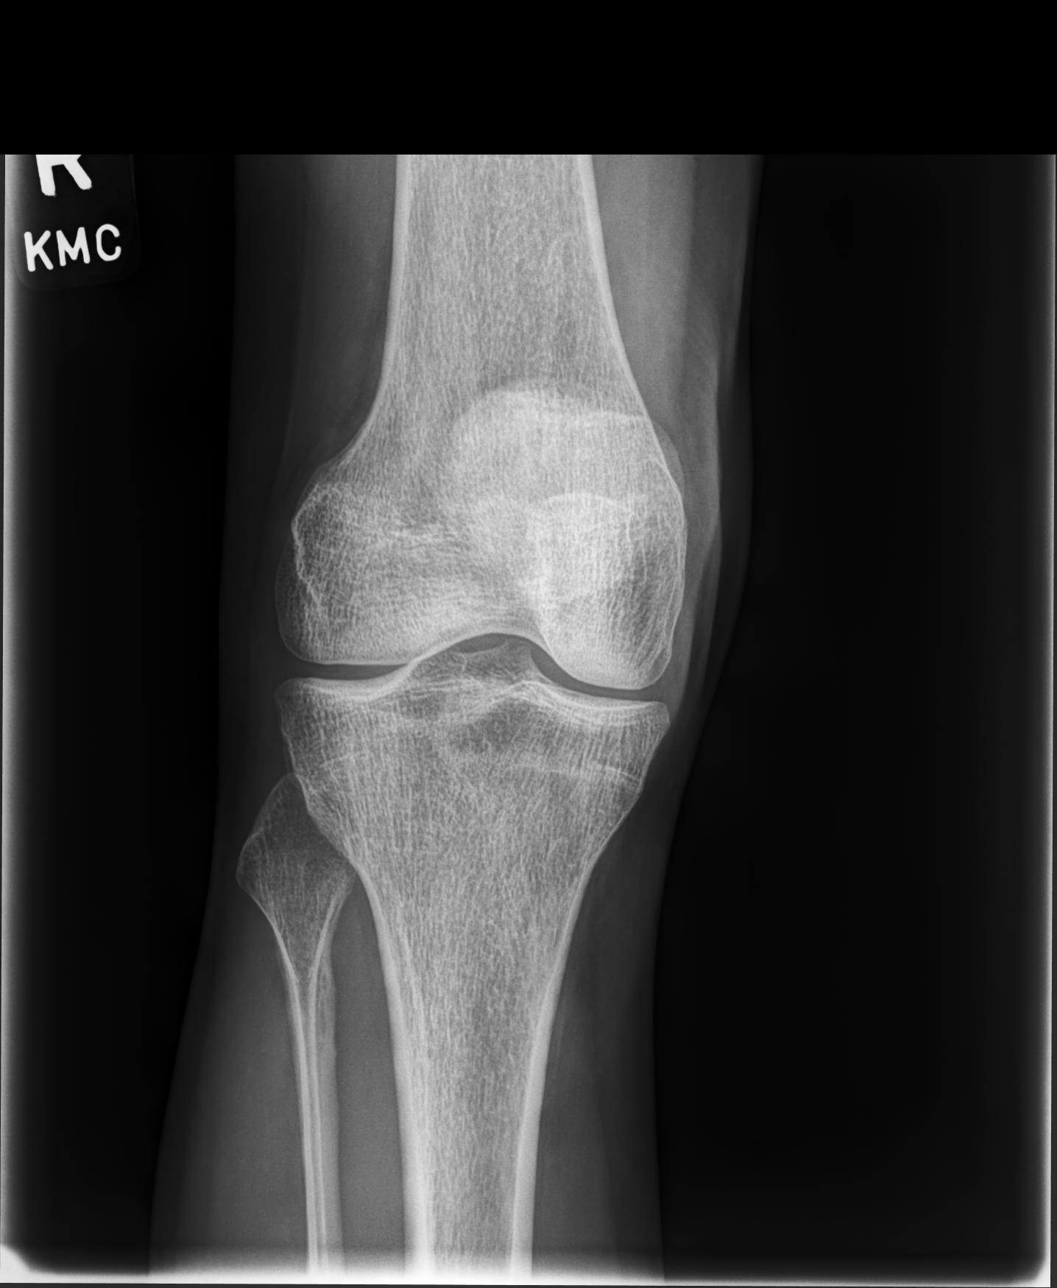

[knee mlo]
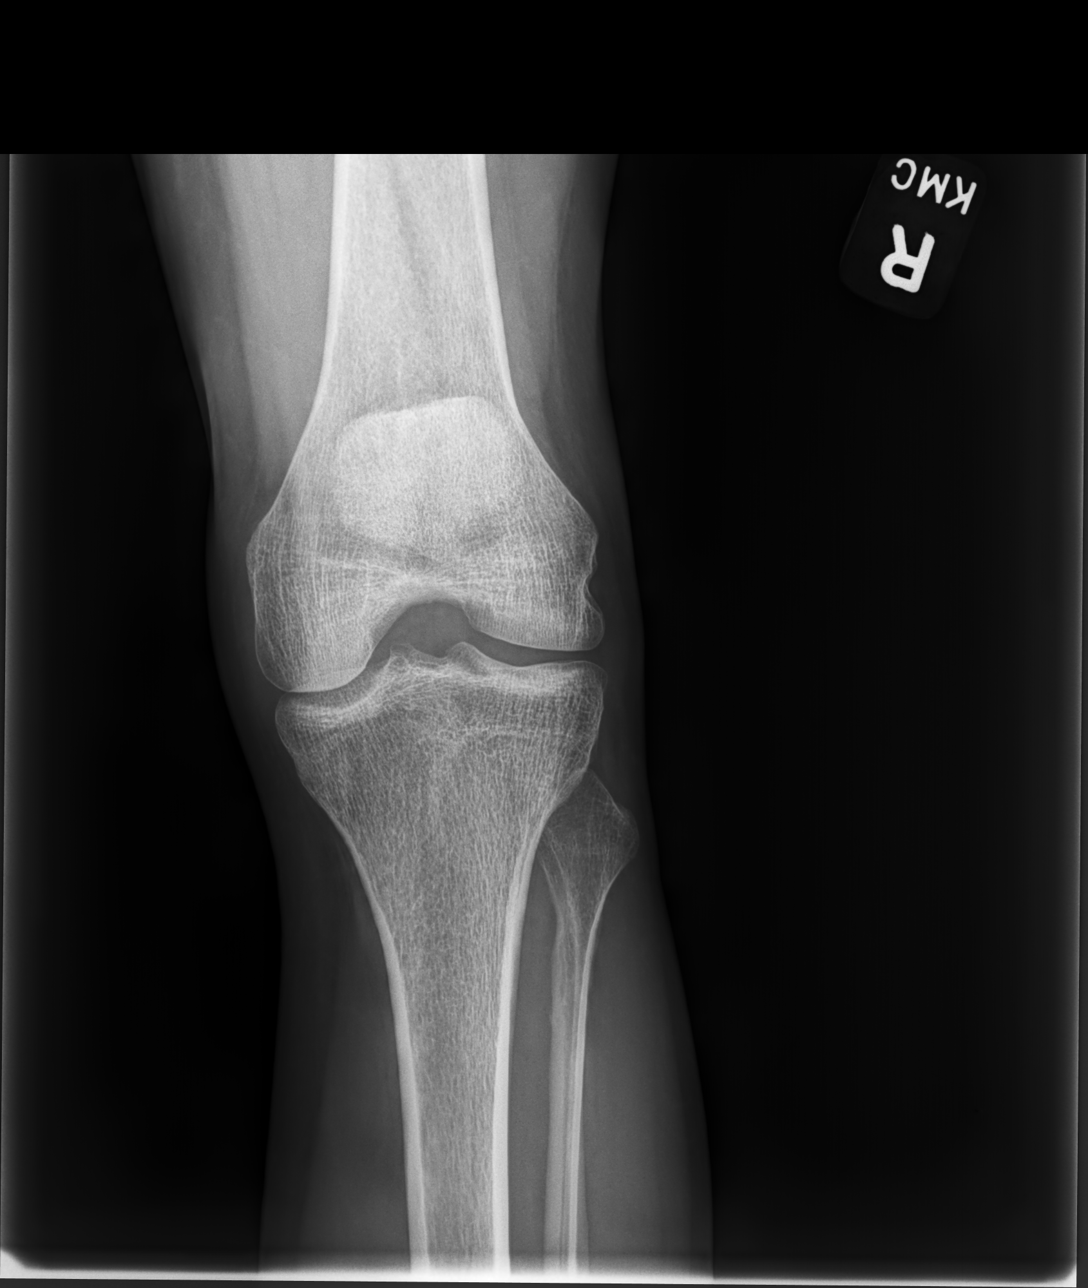

[knee lmo]
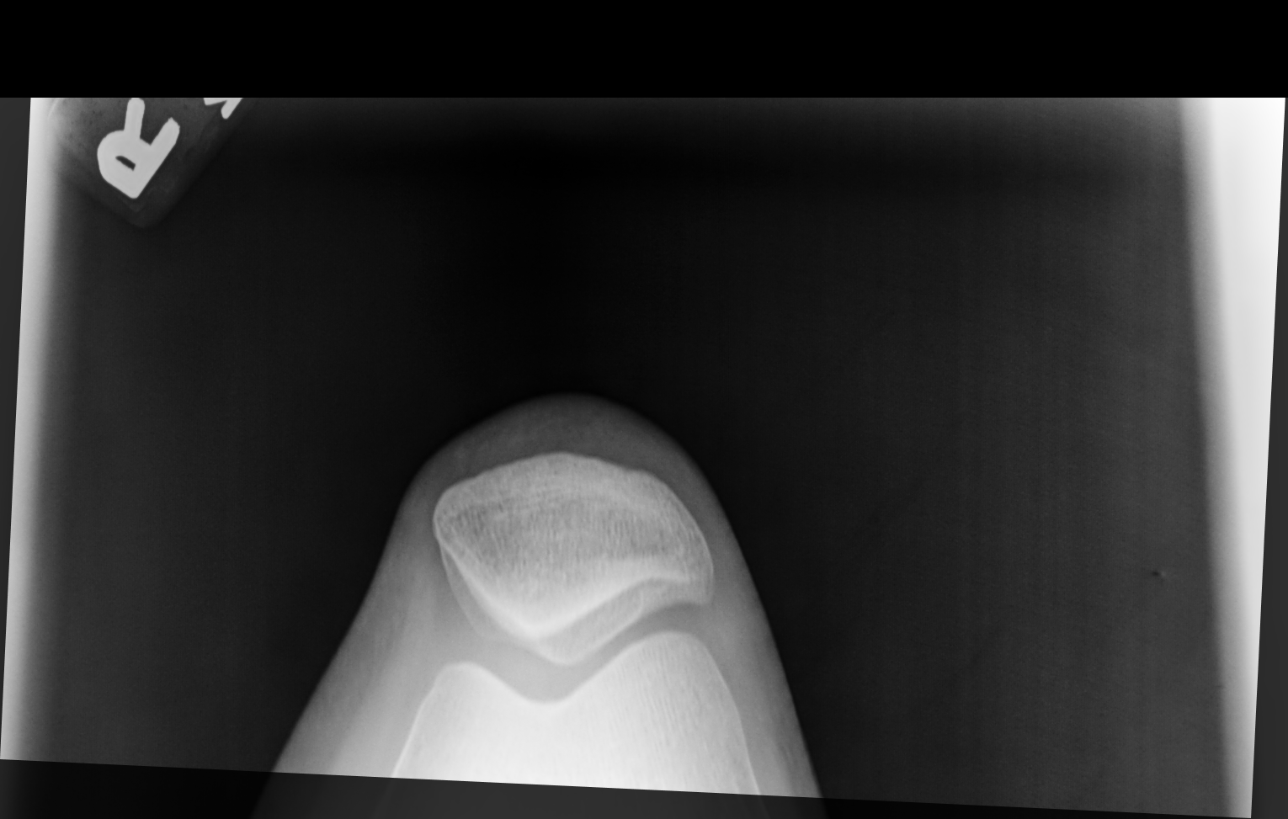

[knee lat]
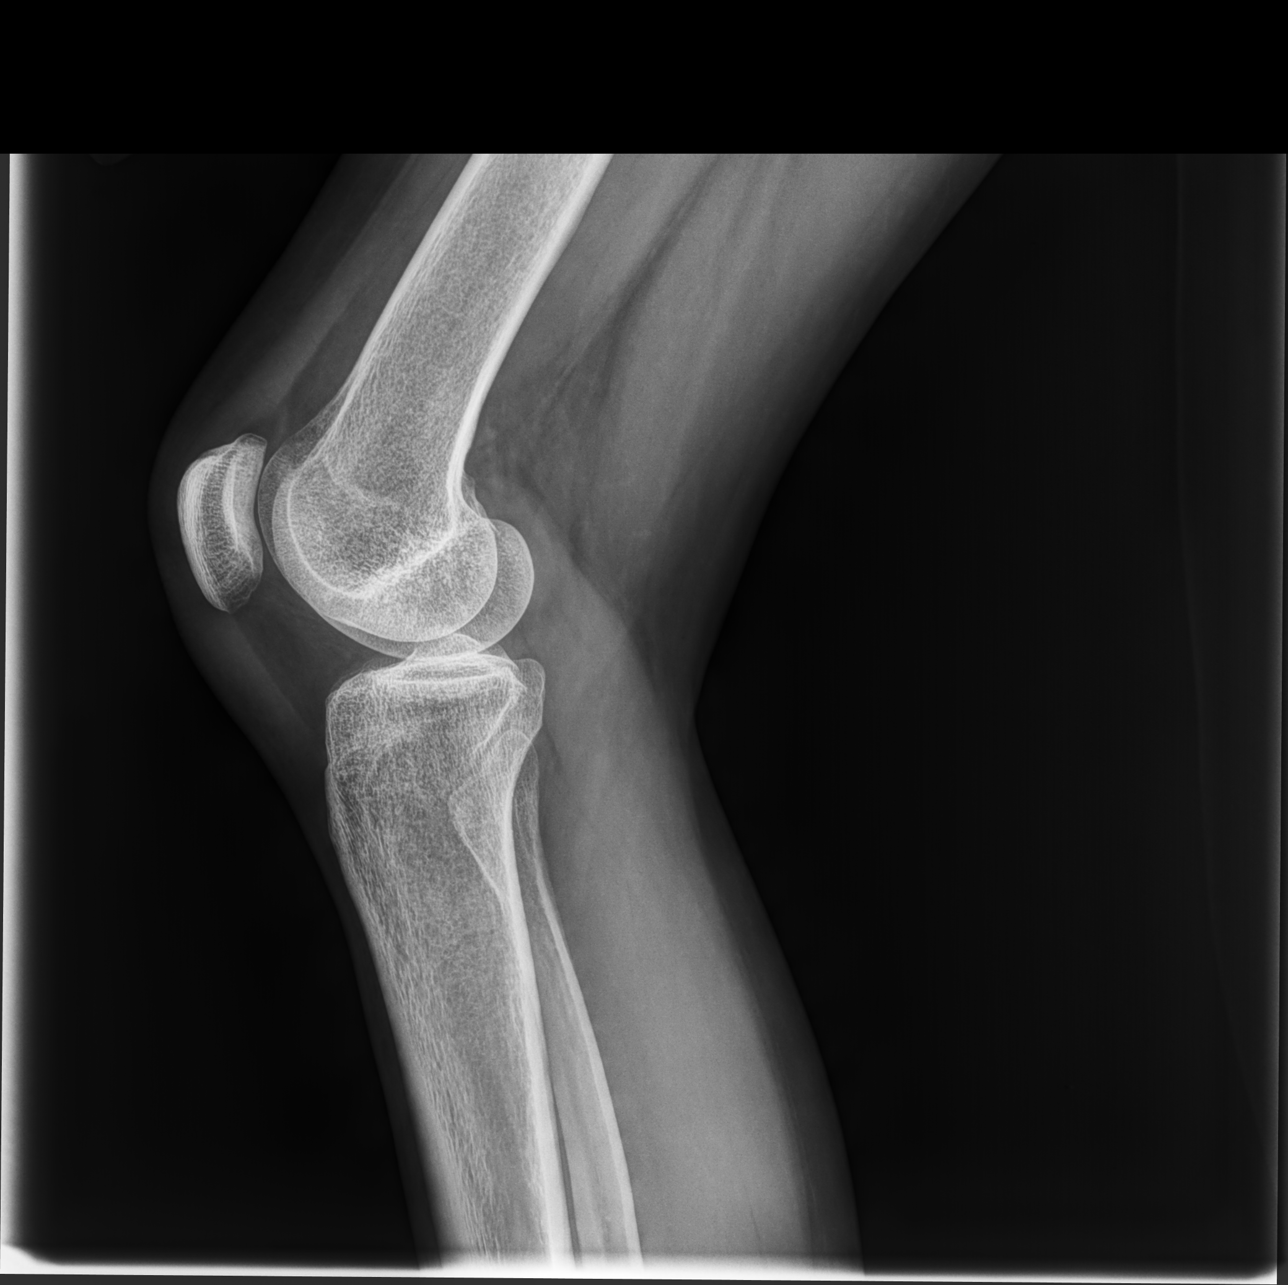

[4 of 4 positions shown; findings below may reference images not displayed]

FINDINGS: No evidence of fracture, dislocation, or joint effusion. No evidence
of arthropathy or other focal bone abnormality. No joint effusion.
IMPRESSION: Negative right knee radiographs

## 2023-01-26 ENCOUNTER — Ambulatory Visit
Admission: EM | Admit: 2023-01-26 | Discharge: 2023-01-26 | Disposition: A | Discharge status: Home / Self Care | Payer: Medicaid Other | Attending: Emergency Medicine | Admitting: Emergency Medicine

## 2023-01-26 DIAGNOSIS — J069 Acute upper respiratory infection, unspecified: Secondary | ICD-10-CM | POA: Diagnosis not present

## 2023-01-26 MED ORDER — FLUTICASONE PROPIONATE 50 MCG/ACT NA SUSP: 1.0000 | Freq: Every day | NASAL | 0 refills | Status: DC

## 2023-01-26 NOTE — ED Provider Notes (Signed)
[name]    CSN: [id number] Arrival date & time: 01/26/23  0818      History   Chief Complaint Chief Complaint  Patient presents with   Nasal Congestion    HPI Pamela Bailey is a 42 y.o. female.  Patient presents with 2-day history of runny nose, congestion, postnasal drip.  She denies fever, wheezing, shortness of breath, or other symptoms.  No treatments at home.  She has not needed to use her albuterol.  Patient was seen here on 10/07/2022; diagnosed with productive cough, COPD exacerbation, smoker; treated with prednisone, doxycycline, albuterol inhaler.  Her medical history includes chronic fatigue, chronic low back pain, COPD.  The history is provided by the patient and medical records.    Past Medical History:  Diagnosis Date   Abnormal Pap smear    colpo 5/09   Anxiety    Asthma    Cleft hard palate    Cleft lip    Depression    Endometriosis    Facial palsy    Headache(784.0)    Ovarian cyst    PONV (postoperative nausea and vomiting)     Patient Active Problem List   Diagnosis Date Noted   Productive cough 10/07/2022   Encounter for medical examination to establish care 12/07/2017   Chronic midline low back pain without sciatica 09/09/2016   Weakness 06/21/2016   Left leg numbness 06/21/2016   Numbness of upper extremity 06/21/2016   Weakness of upper extremity 06/21/2016   Arm weakness 06/21/2016   Bilateral arm weakness    Hypokalemia    Leukocytosis    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   Chronic fatigue 07/16/2015   Anxiety 06/04/2015   Headache 08/30/2008   NAUSEA 08/30/2008   ABDOMINAL PAIN-MULTIPLE SITES 08/30/2008   RENAL FAILURE, ACUTE 08/29/2008    Past Surgical History:  Procedure Laterality Date   ABDOMINAL HYSTERECTOMY     ABDOMINAL SURGERY     BREAST BIOPSY Right 2015   neg   COSMETIC SURGERY     LAPAROSCOPY     SALPINGECTOMY     TYMPANOSTOMY TUBE PLACEMENT     UPPER GASTROINTESTINAL ENDOSCOPY      OB History      Gravida  2   Para  2   Term  2   Preterm      AB      Living  2      SAB      IAB      Ectopic      Multiple      Live Births  2            Home Medications    Prior to Admission medications   Medication Sig Start Date End Date Taking? Authorizing Provider  fluticasone (FLONASE) 50 MCG/ACT nasal spray Place 1 spray into both nostrils daily. 01/26/23  Yes Mickie Bail, NP  albuterol (VENTOLIN HFA) 108 (90 Base) MCG/ACT inhaler Inhale 1-2 puffs into the lungs every 6 (six) hours as needed. 10/07/22   Mickie Bail, NP  ALPRAZolam Prudy Feeler) 1 MG tablet Take 1 mg by mouth 3 (three) times daily as needed for anxiety.     [provider]  cyclobenzaprine (FLEXERIL) 10 MG tablet Take 10 mg by mouth 3 (three) times daily as needed for muscle spasms.    [provider]  escitalopram (LEXAPRO) 20 MG tablet Take 20 mg by mouth at bedtime.    [provider]  HYDROcodone-acetaminophen (NORCO) 10-325 MG tablet  Take 1 tablet by mouth 3 (three) times daily as needed (for pain). 11/10/21   [provider]  ondansetron (ZOFRAN-ODT) 8 MG disintegrating tablet Take 8 mg by mouth every 8 (eight) hours as needed for nausea or vomiting (dissolve orally).    [provider]  Vitamin D, Ergocalciferol, (DRISDOL) 1.25 MG (50000 UNIT) CAPS capsule Take 50,000 Units by mouth once a week. 09/29/20   [provider]    Family History Family History  Problem Relation Age of Onset   Chronic fatigue Mother    Stroke Father    Breast cancer Sister        1/2 sister paternal   Breast cancer Maternal Grandmother    Colon cancer Maternal Grandmother    Hypertension Maternal Grandfather    Anesthesia problems Neg Hx    Esophageal cancer Neg Hx    Stomach cancer Neg Hx     Social History Social History   Tobacco Use   Smoking status: Every Day    Packs/day: 0.50    Years: 8.00    Additional pack years: 0.00    Total pack years: 4.00     Types: Cigarettes   Smokeless tobacco: Never  Vaping Use   Vaping Use: Some days   Substances: Nicotine, Flavoring  Substance Use Topics   Alcohol use: Yes    Comment: 1-2 drinks per week   Drug use: No     Allergies   Azithromycin, Vancomycin, and Oxycodone   Review of Systems Review of Systems  Constitutional:  Negative for chills and fever.  HENT:  Positive for congestion, postnasal drip and rhinorrhea. Negative for ear pain and sore throat.   Respiratory:  Negative for cough, shortness of breath and wheezing.   Cardiovascular:  Negative for chest pain and palpitations.  Skin:  Negative for rash.     Physical Exam Triage Vital Signs ED Triage Vitals  Enc Vitals Group     BP --      Pulse Rate 01/26/23 0900 76     Resp 01/26/23 0900 18     Temp 01/26/23 0900 98.1 F (36.7 C)     Temp src --      SpO2 01/26/23 0900 95 %     Weight --      Height --      Head Circumference --      Peak Flow --      Pain Score 01/26/23 0901 4     Pain Loc --      Pain Edu? --      Excl. in [place]? --    No data found.  Updated Vital Signs BP 110/80   Pulse 76   Temp 98.1 F (36.7 C)   Resp 18   LMP 06/03/2014   SpO2 95%   Visual Acuity Right Eye Distance:   Left Eye Distance:   Bilateral Distance:    Right Eye Near:   Left Eye Near:    Bilateral Near:     Physical Exam Vitals and nursing note reviewed.  Constitutional:      General: She is not in acute distress.    Appearance: She is well-developed. She is not ill-appearing.  HENT:     Right Ear: Tympanic membrane normal.     Left Ear: Tympanic membrane normal.     Nose: Nose normal.     Mouth/Throat:     Mouth: Mucous membranes are moist.     Pharynx: Oropharynx is clear.  Comments: Clear PND. Cardiovascular:     Rate and Rhythm: Normal rate and regular rhythm.     Heart sounds: Normal heart sounds.  Pulmonary:     Effort: Pulmonary effort is normal. No respiratory distress.     Breath sounds: Normal  breath sounds. No wheezing.  Musculoskeletal:     Cervical back: Neck supple.  Skin:    General: Skin is warm and dry.  Neurological:     Mental Status: She is alert.  Psychiatric:        Mood and Affect: Mood normal.        Behavior: Behavior normal.      UC Treatments / Results  Labs (all labs ordered are listed, but only abnormal results are displayed) Labs Reviewed - No data to display  EKG   Radiology No results found.  Procedures Procedures (including critical care time)  Medications Ordered in UC Medications - No data to display  Initial Impression / Assessment and Plan / UC Course  I have reviewed the triage vital signs and the nursing notes.  Pertinent labs & imaging results that were available during my care of the patient were reviewed by me and considered in my medical decision making (see chart for details).    Viral URI.  No respiratory distress, lungs are clear, O2 sat 95% on room air.  Discussed symptomatic treatment including Flonase nasal spray, Tylenol, rest, hydration.  Instructed patient to follow up with her PCP if her symptoms are not improving.  She agrees to plan of care.   Final Clinical Impressions(s) / UC Diagnoses   Final diagnoses:  Viral URI     Discharge Instructions      Take Tylenol as needed for fever or discomfort.  Use Flonase as directed.   Follow-up with your primary care provider if your symptoms are not improving.         ED Prescriptions     Medication Sig Dispense Auth. Provider   fluticasone (FLONASE) 50 MCG/ACT nasal spray Place 1 spray into both nostrils daily. 16 g Mickie Bail, NP      PDMP not reviewed this encounter.   Mickie Bail, NP 01/26/23 [phone]

## 2023-01-26 NOTE — ED Triage Notes (Signed)
Patient to Urgent Care with complaints of nasal congestion/ head congestion/ left sided ear pain. Post nasal drip.   Reports symptoms started 2-3 days ago.

## 2023-01-26 NOTE — Discharge Instructions (Addendum)
Take Tylenol as needed for fever or discomfort.  Use Flonase as directed.   Follow-up with your primary care provider if your symptoms are not improving.

## 2023-11-02 ENCOUNTER — Emergency Department (HOSPITAL_BASED_OUTPATIENT_CLINIC_OR_DEPARTMENT_OTHER)
Admission: EM | Admit: 2023-11-02 | Discharge: 2023-11-02 | Discharge status: Against Medical Advice | Payer: Medicaid Other | Attending: Emergency Medicine | Admitting: Emergency Medicine

## 2023-11-02 ENCOUNTER — Encounter (HOSPITAL_BASED_OUTPATIENT_CLINIC_OR_DEPARTMENT_OTHER): Payer: Self-pay | Admitting: Emergency Medicine

## 2023-11-02 ENCOUNTER — Emergency Department (HOSPITAL_BASED_OUTPATIENT_CLINIC_OR_DEPARTMENT_OTHER): Payer: Medicaid Other

## 2023-11-02 ENCOUNTER — Other Ambulatory Visit: Payer: Self-pay

## 2023-11-02 DIAGNOSIS — R29810 Facial weakness: Secondary | ICD-10-CM | POA: Diagnosis present

## 2023-11-02 DIAGNOSIS — Z20822 Contact with and (suspected) exposure to covid-19: Secondary | ICD-10-CM | POA: Diagnosis not present

## 2023-11-02 DIAGNOSIS — R1013 Epigastric pain: Secondary | ICD-10-CM | POA: Diagnosis not present

## 2023-11-02 DIAGNOSIS — R1031 Right lower quadrant pain: Secondary | ICD-10-CM | POA: Insufficient documentation

## 2023-11-02 DIAGNOSIS — G8929 Other chronic pain: Secondary | ICD-10-CM | POA: Diagnosis not present

## 2023-11-02 DIAGNOSIS — Q670 Congenital facial asymmetry: Secondary | ICD-10-CM | POA: Diagnosis not present

## 2023-11-02 DIAGNOSIS — Z79899 Other long term (current) drug therapy: Secondary | ICD-10-CM | POA: Insufficient documentation

## 2023-11-02 DIAGNOSIS — R109 Unspecified abdominal pain: Secondary | ICD-10-CM

## 2023-11-02 LAB — CBC WITH DIFFERENTIAL/PLATELET
Abs Immature Granulocytes: 0.02 K/uL (ref 0.00–0.07)
Basophils Absolute: 0 K/uL (ref 0.0–0.1)
Basophils Relative: 1 %
Eosinophils Absolute: 0.2 K/uL (ref 0.0–0.5)
Eosinophils Relative: 2 %
HCT: 43.5 % (ref 36.0–46.0)
Hemoglobin: 14.9 g/dL (ref 12.0–15.0)
Immature Granulocytes: 0 %
Lymphocytes Relative: 32 %
Lymphs Abs: 2.7 K/uL (ref 0.7–4.0)
MCH: 32.1 pg (ref 26.0–34.0)
MCHC: 34.3 g/dL (ref 30.0–36.0)
MCV: 93.8 fL (ref 80.0–100.0)
Monocytes Absolute: 0.5 K/uL (ref 0.1–1.0)
Monocytes Relative: 6 %
Neutro Abs: 5.1 K/uL (ref 1.7–7.7)
Neutrophils Relative %: 59 %
Platelets: 249 K/uL (ref 150–400)
RBC: 4.64 MIL/uL (ref 3.87–5.11)
RDW: 12.2 % (ref 11.5–15.5)
WBC: 8.5 K/uL (ref 4.0–10.5)
nRBC: 0 % (ref 0.0–0.2)

## 2023-11-02 LAB — COMPREHENSIVE METABOLIC PANEL WITH GFR
ALT: 21 U/L (ref 0–44)
AST: 21 U/L (ref 15–41)
Albumin: 4.6 g/dL (ref 3.5–5.0)
Alkaline Phosphatase: 76 U/L (ref 38–126)
Anion gap: 8 (ref 5–15)
BUN: 21 mg/dL — ABNORMAL HIGH (ref 6–20)
CO2: 28 mmol/L (ref 22–32)
Calcium: 9.5 mg/dL (ref 8.9–10.3)
Chloride: 102 mmol/L (ref 98–111)
Creatinine, Ser: 0.66 mg/dL (ref 0.44–1.00)
GFR, Estimated: >60 mL/min
Glucose, Bld: 95 mg/dL (ref 70–99)
Potassium: 4.1 mmol/L (ref 3.5–5.1)
Sodium: 138 mmol/L (ref 135–145)
Total Bilirubin: 0.9 mg/dL (ref 0.0–1.2)
Total Protein: 7.6 g/dL (ref 6.5–8.1)

## 2023-11-02 LAB — LIPASE, BLOOD: Lipase: 31 U/L (ref 11–51)

## 2023-11-02 LAB — CBG MONITORING, ED: Glucose-Capillary: 96 mg/dL (ref 70–99)

## 2023-11-02 LAB — RESP PANEL BY RT-PCR (RSV, FLU A&B, COVID)  RVPGX2
Influenza A by PCR: NEGATIVE
Influenza B by PCR: NEGATIVE
Resp Syncytial Virus by PCR: NEGATIVE
SARS Coronavirus 2 by RT PCR: NEGATIVE

## 2023-11-02 MED ORDER — DEXAMETHASONE SODIUM PHOSPHATE 4 MG/ML IJ SOLN
4.0000 mg | Freq: Once | INTRAMUSCULAR | Status: AC
  Administered 2023-11-02: 4 mg via INTRAVENOUS
  Filled 2023-11-02: qty 1

## 2023-11-02 MED ORDER — IOHEXOL 350 MG/ML SOLN
100.0000 mL | Freq: Once | INTRAVENOUS | Status: AC | PRN
  Administered 2023-11-02: 75 mL via INTRAVENOUS

## 2023-11-02 NOTE — ED Notes (Signed)
PIV IV removed per pt request.  Pt advised to go directly to [facility] for further testing.  Pt verbalized understanding, ambulatory to ED exit with s/o, steady gait.

## 2023-11-02 NOTE — ED Triage Notes (Signed)
Pt states she was at her GI doctor for abdominal pain and GERD which she has had for a while.  Pt states yesterday at 12 pm she noted that she has an increase with her facial palsy, along with decreased sensation to right arm.  Right cheek feels more sensitive.  Grips equal, good strength.  Pt states she feels heavier since yesterday.  Low grade fever.

## 2023-11-02 NOTE — ED Provider Notes (Signed)
Patient here with worsening right-sided facial droop and worsening chronic abdominal pain.  She is pending a CT scan of her head and abdomen and pelvis.  Lab works unremarkable.  CT scan of the abdomen and pelvis unremarkable.  CT scan of the head unremarkable.  She had a cleft lip cleft palate and some chronic right-sided facial droop at baseline but may be worse since over 24 hours ago.  She cannot really confidently tell me if she is able to move her forehead at baseline.  Not sure if this is like a Bell's palsy or slightly worse than her baseline.  Dr. Janine Ores had already discussed with them about going to [facility] for an MRI which they are amenable to.  Overall she is stable images are unremarkable.  If MRI is negative for stroke I think you can consider treating her for maybe Bell's palsy and have her follow-up with primary care and neurology outpatient.  This chart was dictated using voice recognition software.  Despite best efforts to proofread,  errors can occur which can change the documentation meaning.    Virgina Norfolk, DO 11/02/23 1630

## 2023-11-02 NOTE — ED Provider Notes (Signed)
[facility] Provider Note   CSN: [id number] Arrival date & time: 11/02/23  1138     History  Chief Complaint  Patient presents with   Facial Droop   Abdominal Pain    Pamela Bailey is a 43 y.o. female.  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  Patient notes increased nasal congestion and facial swelling over the last 24 hours.  Beginning at noon yesterday she started to notice an increase in her baseline right facial droop as well as decreased sensation in her right arm.  Her right face feels heavy and her husband does comment that the right side of her face appears swollen.  She has had subjective fever and sore throat as well.  She was seen by her GI doctor as an outpatient for further evaluation of chronic abdominal pain and is scheduled to have colonoscopy and endoscopy next week.  She does note increased right sided abdominal pain and swelling over the last 24 hours.  No nausea or vomiting or diarrhea or changes in bowel habits.  Denies urinary symptoms.  No focal weakness headaches or other complaints at this time   Abdominal Pain      Home Medications Prior to Admission medications   Medication Sig Start Date End Date Taking? Authorizing Provider  albuterol (VENTOLIN HFA) 108 (90 Base) MCG/ACT inhaler Inhale 1-2 puffs into the lungs every 6 (six) hours as needed. 10/07/22   Mickie Bail, NP  ALPRAZolam Prudy Feeler) 1 MG tablet Take 1 mg by mouth 3 (three) times daily as needed for anxiety.     [provider]  cyclobenzaprine (FLEXERIL) 10 MG tablet Take 10 mg by mouth 3 (three) times daily as needed for muscle spasms.    [provider]  escitalopram (LEXAPRO) 20 MG tablet Take 20 mg by mouth at bedtime.    [provider]  fluticasone (FLONASE) 50 MCG/ACT nasal spray Place 1 spray into both nostrils  daily. 01/26/23   Mickie Bail, NP  HYDROcodone-acetaminophen (NORCO) 10-325 MG tablet Take 1 tablet by mouth 3 (three) times daily as needed (for pain). 11/10/21   [provider]  ondansetron (ZOFRAN-ODT) 8 MG disintegrating tablet Take 8 mg by mouth every 8 (eight) hours as needed for nausea or vomiting (dissolve orally).    [provider]  Vitamin D, Ergocalciferol, (DRISDOL) 1.25 MG (50000 UNIT) CAPS capsule Take 50,000 Units by mouth once a week. 09/29/20   [provider]      Allergies    Azithromycin, Vancomycin, and Oxycodone    Review of Systems   Review of Systems  Gastrointestinal:  Positive for abdominal pain.    Physical Exam Updated Vital Signs BP 101/73   Pulse 67   Temp 99.1 F (37.3 C)   Resp 19   Ht 5\' 2"  (1.575 m)   Wt 38.6 kg   LMP 06/03/2014   SpO2 99%   BMI 15.55 kg/m  Physical Exam Vitals and nursing note reviewed.  HENT:     Head: Normocephalic and atraumatic.  Eyes:     Pupils: Pupils are equal, round, and reactive to light.  Cardiovascular:     Rate and Rhythm: Normal rate and regular rhythm.  Pulmonary:     Effort: Pulmonary effort is normal.     Breath sounds: Normal breath sounds.  Abdominal:  Palpations: Abdomen is soft.     Tenderness: There is abdominal tenderness in the right lower quadrant and epigastric area.  Skin:    General: Skin is warm and dry.  Neurological:     Mental Status: She is alert.     Comments: Facial droop of right Inability to close right eye, chronic Increase sensation to light touch on the right side of face 5 out of 5 motor strength bilateral upper and lower extremities No pronator drift Sensation intact to light touch on the right upper and right lower extremities  Psychiatric:        Mood and Affect: Mood normal.     ED Results / Procedures / Treatments   Labs (all labs ordered are listed, but only abnormal results are displayed) Labs Reviewed  COMPREHENSIVE METABOLIC  PANEL - Abnormal; Notable for the following components:      Result Value   BUN 21 (*)    All other components within normal limits  RESP PANEL BY RT-PCR (RSV, FLU A&B, COVID)  RVPGX2  LIPASE, BLOOD  CBC WITH DIFFERENTIAL/PLATELET  CBG MONITORING, ED    EKG EKG Interpretation Date/Time:  Wednesday November 02 2023 12:10:48 EST Ventricular Rate:  79 PR Interval:  125 QRS Duration:  85 QT Interval:  379 QTC Calculation: 435 R Axis:   79  Text Interpretation: Sinus rhythm Consider right atrial enlargement RSR' in V1 or V2, probably normal variant Confirmed by Estelle June [phone]) on 11/02/2023 2:17:41 PM  Radiology No results found.  Procedures Procedures    Medications Ordered in ED Medications  dexamethasone (DECADRON) injection 4 mg (4 mg Intravenous Given 11/02/23 1322)  iohexol (OMNIPAQUE) 350 MG/ML injection 100 mL (75 mLs Intravenous Contrast Given 11/02/23 1307)    ED Course/ Medical Decision Making/ A&P Clinical Course as of 11/02/23 1525  Wed Nov 02, 2023  1523 No significant laboratory abnormalities seen on CBC or CMP.  COVID RSV influenza all negative.  Reassessed patient after Decadron.  No significant change and she still feels as though there is increased right sided facial droop  Awaiting results of CT abdomen pelvis and CTA head and neck  I discussed with the patient and her husband that she will likely require MRI to rule out stroke as cause of her symptoms if the CT head and neck show no obvious findings.  At this time she states she would be amenable to having an MRI taken  I, Estelle June DO, am transitioning care of this patient to the oncoming provider pending CT results, reevaluation, discussion of MRI and disposition [MP]    Clinical Course User Index [MP] Royanne Foots, DO                                 Medical Decision Making 43 year old female with complex medical history as above presenting from GI office given concern for increased  facial droop.  She has a facial droop at baseline on the right but feels as though it is more prominent beginning at noon yesterday.  She does note increased right-sided facial swelling nasal congestion and a sore throat during this timeframe as well.  Based on an old photo that her husband showed me there does appear to be increased right-sided facial asymmetry.  No other focal neurologic deficits at this time.  She is outside of the window for code stroke activation but will obtain CTA head and neck with and  without contrast.  Suspect there may be an underlying inflammatory/infectious etiology here.  Will obtain viral swab along with give her dose of Decadron to see if that helps with her symptoms if caused by inflammation.  Regarding her abdominal pain this is a chronic issue for her but she does feel as though there was an acute change yesterday and has some right-sided abdominal pain right lower quadrant tenderness on my exam.  Will obtain laboratory workup including BMP CBC lipase and CT abdomen pelvis to evaluate for intra-abdominal infection  Amount and/or Complexity of Data Reviewed Labs: ordered. Radiology: ordered.  Risk Prescription drug management.           Final Clinical Impression(s) / ED Diagnoses Final diagnoses:  Facial asymmetry  Abdominal pain, unspecified abdominal location    Rx / DC Orders ED Discharge Orders     None         Royanne Foots, DO 11/02/23 1525

## 2024-03-01 ENCOUNTER — Ambulatory Visit
Admission: EM | Admit: 2024-03-01 | Discharge: 2024-03-01 | Disposition: A | Discharge status: Home / Self Care | Attending: Urgent Care | Admitting: Urgent Care

## 2024-03-01 DIAGNOSIS — R5381 Other malaise: Secondary | ICD-10-CM

## 2024-03-01 DIAGNOSIS — R52 Pain, unspecified: Secondary | ICD-10-CM

## 2024-03-01 DIAGNOSIS — G5752 Tarsal tunnel syndrome, left lower limb: Secondary | ICD-10-CM | POA: Diagnosis not present

## 2024-03-01 DIAGNOSIS — W57XXXA Bitten or stung by nonvenomous insect and other nonvenomous arthropods, initial encounter: Secondary | ICD-10-CM

## 2024-03-01 MED ORDER — NAPROXEN 375 MG PO TABS: 375.0000 mg | ORAL_TABLET | Freq: Two times a day (BID) | ORAL | 0 refills | Status: DC

## 2024-03-01 NOTE — ED Triage Notes (Signed)
 Pt states left ankle pain for the past 2 days, denies injury.  Also states she was bit by a tick to her right groin,states she has not been feeling well ever since.

## 2024-03-01 NOTE — ED Provider Notes (Signed)
 [facility]  Note:  This document was prepared using Conservation officer, historic buildings and may include unintentional dictation errors.  MRN: [id number] DOB: 27-Aug-1981  Subjective:   Pamela Bailey is a 44 y.o. female presenting for left ankle pain.  Patient reports that she has had a lot of increased activity taking care of her husband.  She has also very active.  No known injury, fall, trauma, weakness, numbness or tingling, rashes, redness, swelling, history of musculoskeletal conditions.  Patient does report concern about a tick bite to the right groin.  She is worried about tickborne illness.  She did have send recent testing done with her PCP at [facility].  Declines any further workup or intervention from us  regarding the tick bite, plans on following up with her PCP ASAP.  No current facility-administered medications for this encounter.  Current Outpatient Medications:    albuterol  (VENTOLIN  HFA) 108 (90 Base) MCG/ACT inhaler, Inhale 1-2 puffs into the lungs every 6 (six) hours as needed., Disp: 18 g, Rfl: 0   ALPRAZolam  (XANAX ) 1 MG tablet, Take 1 mg by mouth 3 (three) times daily as needed for anxiety. , Disp: , Rfl:    cyclobenzaprine  (FLEXERIL ) 10 MG tablet, Take 10 mg by mouth 3 (three) times daily as needed for muscle spasms., Disp: , Rfl:    escitalopram (LEXAPRO) 20 MG tablet, Take 20 mg by mouth at bedtime., Disp: , Rfl:    fluticasone  (FLONASE ) 50 MCG/ACT nasal spray, Place 1 spray into both nostrils daily., Disp: 16 g, Rfl: 0   HYDROcodone -acetaminophen  (NORCO) 10-325 MG tablet, Take 1 tablet by mouth 3 (three) times daily as needed (for pain)., Disp: , Rfl:    ondansetron  (ZOFRAN -ODT) 8 MG disintegrating tablet, Take 8 mg by mouth every 8 (eight) hours as needed for nausea or vomiting (dissolve orally)., Disp: , Rfl:    Vitamin D, Ergocalciferol, (DRISDOL) 1.25 MG (50000 UNIT) CAPS capsule, Take 50,000 Units by mouth once a week., Disp: , Rfl:     Allergies  Allergen Reactions   Azithromycin  Other (See Comments)    Caused renal failure (On 11/19/16, patient states this did NOT??)   Vancomycin  Other (See Comments)    Acute kidney injury    Oxycodone  Nausea And Vomiting    Past Medical History:  Diagnosis Date   Abnormal Pap smear    colpo 5/09   Anxiety    Asthma    Cleft hard palate    Cleft lip    Depression    Endometriosis    Facial palsy    Headache(784.0)    Ovarian cyst    PONV (postoperative nausea and vomiting)      Past Surgical History:  Procedure Laterality Date   ABDOMINAL HYSTERECTOMY     ABDOMINAL SURGERY     BREAST BIOPSY Right 2015   neg   COSMETIC SURGERY     LAPAROSCOPY     SALPINGECTOMY     TYMPANOSTOMY TUBE PLACEMENT     UPPER GASTROINTESTINAL ENDOSCOPY      Family History  Problem Relation Age of Onset   Chronic fatigue Mother    Stroke Father    Breast cancer Sister        1/2 sister paternal   Breast cancer Maternal Grandmother    Colon cancer Maternal Grandmother    Hypertension Maternal Grandfather    Anesthesia problems Neg Hx    Esophageal cancer Neg Hx    Stomach cancer Neg Hx  Social History   Tobacco Use   Smoking status: Every Day    Current packs/day: 0.50    Average packs/day: 0.5 packs/day for 8.0 years (4.0 ttl pk-yrs)    Types: Cigarettes   Smokeless tobacco: Never  Vaping Use   Vaping status: Some Days   Substances: Nicotine , Flavoring  Substance Use Topics   Alcohol use: Yes    Comment: 1-2 drinks per week   Drug use: No    ROS   Objective:   Vitals: BP 128/72 (BP Location: Left Arm)   Pulse 80   Temp 98.2 F (36.8 C) (Oral)   Resp 16   LMP 06/03/2014   SpO2 98%   Physical Exam Constitutional:      General: She is not in acute distress.    Appearance: Normal appearance. She is well-developed. She is not ill-appearing, toxic-appearing or diaphoretic.  HENT:     Head: Normocephalic and atraumatic.     Nose: Nose normal.      Mouth/Throat:     Mouth: Mucous membranes are moist.   Eyes:     General: No scleral icterus.       Right eye: No discharge.        Left eye: No discharge.     Extraocular Movements: Extraocular movements intact.    Cardiovascular:     Rate and Rhythm: Normal rate.  Pulmonary:     Effort: Pulmonary effort is normal.   Musculoskeletal:       Legs:   Skin:    General: Skin is warm and dry.   Neurological:     General: No focal deficit present.     Mental Status: She is alert and oriented to person, place, and time.   Psychiatric:        Mood and Affect: Mood normal.        Behavior: Behavior normal.    A 4 inch Ace wrap was applied to the left ankle in figure 8 method.  Assessment and Plan :   PDMP not reviewed this encounter.  1. Tarsal tunnel syndrome of left side   2. Body aches   3. Malaise   4. Tick bite, initial encounter    Patient declined workup and intervention for her tick bite.  Plans on following up with her PCP tomorrow.  Otherwise, will manage for a tarsal tunnel syndrome of the left ankle.  Recommended conservative management, RICE method, naproxen  for pain and inflammation.  Counseled patient on potential for adverse effects with medications prescribed/recommended today, ER and return-to-clinic precautions discussed, patient verbalized understanding.    Adolph Hoop, [place] 03/05/24 [phone]

## 2024-03-10 ENCOUNTER — Emergency Department (HOSPITAL_COMMUNITY)

## 2024-03-10 ENCOUNTER — Observation Stay (HOSPITAL_COMMUNITY)
Admission: EM | Admit: 2024-03-10 | Discharge: 2024-03-10 | Disposition: A | Discharge status: Home / Self Care | Attending: Student | Admitting: Student

## 2024-03-10 ENCOUNTER — Other Ambulatory Visit: Payer: Self-pay

## 2024-03-10 ENCOUNTER — Encounter (HOSPITAL_COMMUNITY): Payer: Self-pay

## 2024-03-10 DIAGNOSIS — J45909 Unspecified asthma, uncomplicated: Secondary | ICD-10-CM | POA: Insufficient documentation

## 2024-03-10 DIAGNOSIS — R110 Nausea: Secondary | ICD-10-CM | POA: Insufficient documentation

## 2024-03-10 DIAGNOSIS — R109 Unspecified abdominal pain: Secondary | ICD-10-CM | POA: Diagnosis present

## 2024-03-10 DIAGNOSIS — F419 Anxiety disorder, unspecified: Secondary | ICD-10-CM | POA: Insufficient documentation

## 2024-03-10 DIAGNOSIS — M797 Fibromyalgia: Secondary | ICD-10-CM | POA: Insufficient documentation

## 2024-03-10 DIAGNOSIS — K760 Fatty (change of) liver, not elsewhere classified: Secondary | ICD-10-CM | POA: Diagnosis not present

## 2024-03-10 DIAGNOSIS — R10813 Right lower quadrant abdominal tenderness: Principal | ICD-10-CM | POA: Insufficient documentation

## 2024-03-10 DIAGNOSIS — F17210 Nicotine dependence, cigarettes, uncomplicated: Secondary | ICD-10-CM | POA: Diagnosis not present

## 2024-03-10 DIAGNOSIS — G8929 Other chronic pain: Secondary | ICD-10-CM | POA: Insufficient documentation

## 2024-03-10 DIAGNOSIS — N8000 Endometriosis of the uterus, unspecified: Secondary | ICD-10-CM | POA: Insufficient documentation

## 2024-03-10 LAB — LIPASE, BLOOD: Lipase: 47 U/L (ref 11–51)

## 2024-03-10 LAB — CBC WITH DIFFERENTIAL/PLATELET
Abs Immature Granulocytes: 0.09 10*3/uL — ABNORMAL HIGH (ref 0.00–0.07)
Basophils Absolute: 0.1 10*3/uL (ref 0.0–0.1)
Basophils Relative: 0 %
Eosinophils Absolute: 0.3 10*3/uL (ref 0.0–0.5)
Eosinophils Relative: 2 %
HCT: 41.7 % (ref 36.0–46.0)
Hemoglobin: 13.8 g/dL (ref 12.0–15.0)
Immature Granulocytes: 1 %
Lymphocytes Relative: 23 %
Lymphs Abs: 3.7 10*3/uL (ref 0.7–4.0)
MCH: 31.9 pg (ref 26.0–34.0)
MCHC: 33.1 g/dL (ref 30.0–36.0)
MCV: 96.5 fL (ref 80.0–100.0)
Monocytes Absolute: 1.2 10*3/uL — ABNORMAL HIGH (ref 0.1–1.0)
Monocytes Relative: 7 %
Neutro Abs: 11.1 10*3/uL — ABNORMAL HIGH (ref 1.7–7.7)
Neutrophils Relative %: 67 %
Platelets: 225 10*3/uL (ref 150–400)
RBC: 4.32 MIL/uL (ref 3.87–5.11)
RDW: 11.8 % (ref 11.5–15.5)
WBC: 16.4 10*3/uL — ABNORMAL HIGH (ref 4.0–10.5)
nRBC: 0 % (ref 0.0–0.2)

## 2024-03-10 LAB — URINALYSIS, ROUTINE W REFLEX MICROSCOPIC
Bacteria, UA: NONE SEEN
Bilirubin Urine: NEGATIVE
Glucose, UA: NEGATIVE mg/dL
Ketones, ur: NEGATIVE mg/dL
Leukocytes,Ua: NEGATIVE
Nitrite: NEGATIVE
Protein, ur: NEGATIVE mg/dL
Specific Gravity, Urine: 1.004 — ABNORMAL LOW (ref 1.005–1.030)
pH: 7 (ref 5.0–8.0)

## 2024-03-10 LAB — COMPREHENSIVE METABOLIC PANEL WITH GFR
ALT: 21 U/L (ref 0–44)
AST: 22 U/L (ref 15–41)
Albumin: 4.4 g/dL (ref 3.5–5.0)
Alkaline Phosphatase: 86 U/L (ref 38–126)
Anion gap: 11 (ref 5–15)
BUN: 19 mg/dL (ref 6–20)
CO2: 26 mmol/L (ref 22–32)
Calcium: 9.5 mg/dL (ref 8.9–10.3)
Chloride: 102 mmol/L (ref 98–111)
Creatinine, Ser: 0.57 mg/dL (ref 0.44–1.00)
GFR, Estimated: >60 mL/min (ref >60)
Glucose, Bld: 109 mg/dL — ABNORMAL HIGH (ref 70–99)
Potassium: 3.5 mmol/L (ref 3.5–5.1)
Sodium: 139 mmol/L (ref 135–145)
Total Bilirubin: 0.5 mg/dL (ref 0.0–1.2)
Total Protein: 7.2 g/dL (ref 6.5–8.1)

## 2024-03-10 LAB — HIV ANTIBODY (ROUTINE TESTING W REFLEX): HIV Screen 4th Generation wRfx: NONREACTIVE

## 2024-03-10 MED ORDER — SODIUM CHLORIDE 0.9 % IV BOLUS
1000.0000 mL | Freq: Once | INTRAVENOUS | Status: AC
  Administered 2024-03-10: 1000 mL via INTRAVENOUS

## 2024-03-10 MED ORDER — ACETAMINOPHEN 325 MG PO TABS
650.0000 mg | ORAL_TABLET | Freq: Four times a day (QID) | ORAL | Status: DC | PRN
Start: 2024-03-10 — End: 2024-03-11

## 2024-03-10 MED ORDER — ONDANSETRON HCL 4 MG/2ML IJ SOLN
4.0000 mg | Freq: Four times a day (QID) | INTRAMUSCULAR | Status: DC | PRN
Start: 2024-03-10 — End: 2024-03-11

## 2024-03-10 MED ORDER — BUPROPION HCL ER (XL) 300 MG PO TB24
300.0000 mg | ORAL_TABLET | Freq: Every day | ORAL | Status: DC
  Administered 2024-03-10: 300 mg via ORAL
  Filled 2024-03-10: qty 1

## 2024-03-10 MED ORDER — IOHEXOL 300 MG/ML  SOLN
100.0000 mL | Freq: Once | INTRAMUSCULAR | Status: AC | PRN
  Administered 2024-03-10: 100 mL via INTRAVENOUS

## 2024-03-10 MED ORDER — SODIUM CHLORIDE 0.9 % IV SOLN: INTRAVENOUS | Status: DC

## 2024-03-10 MED ORDER — ENOXAPARIN SODIUM 30 MG/0.3ML IJ SOSY
30.0000 mg | PREFILLED_SYRINGE | INTRAMUSCULAR | Status: DC
  Filled 2024-03-10: qty 0.3

## 2024-03-10 MED ORDER — SENNOSIDES-DOCUSATE SODIUM 8.6-50 MG PO TABS: 1.0000 | ORAL_TABLET | Freq: Every day | ORAL | Status: DC

## 2024-03-10 MED ORDER — HYDROCODONE-ACETAMINOPHEN 5-325 MG PO TABS
1.0000 | ORAL_TABLET | Freq: Once | ORAL | Status: AC
  Administered 2024-03-10: 1 via ORAL
  Filled 2024-03-10: qty 1

## 2024-03-10 MED ORDER — DICYCLOMINE HCL 10 MG PO CAPS
10.0000 mg | ORAL_CAPSULE | Freq: Once | ORAL | Status: DC
  Filled 2024-03-10: qty 1

## 2024-03-10 MED ORDER — ALUM & MAG HYDROXIDE-SIMETH 200-200-20 MG/5ML PO SUSP
30.0000 mL | Freq: Once | ORAL | Status: DC
  Filled 2024-03-10: qty 30

## 2024-03-10 MED ORDER — FAMOTIDINE IN NACL 20-0.9 MG/50ML-% IV SOLN
20.0000 mg | Freq: Once | INTRAVENOUS | Status: AC
  Administered 2024-03-10: 20 mg via INTRAVENOUS
  Filled 2024-03-10: qty 50

## 2024-03-10 MED ORDER — METOCLOPRAMIDE HCL 5 MG/ML IJ SOLN
10.0000 mg | Freq: Once | INTRAMUSCULAR | Status: AC
  Administered 2024-03-10: 10 mg via INTRAVENOUS
  Filled 2024-03-10: qty 2

## 2024-03-10 MED ORDER — CYCLOBENZAPRINE HCL 10 MG PO TABS: 10.0000 mg | ORAL_TABLET | Freq: Three times a day (TID) | ORAL | Status: DC | PRN

## 2024-03-10 MED ORDER — HYDROMORPHONE HCL 1 MG/ML IJ SOLN: 0.5000 mg | Freq: Four times a day (QID) | INTRAMUSCULAR | Status: DC | PRN

## 2024-03-10 MED ORDER — ALBUTEROL SULFATE (2.5 MG/3ML) 0.083% IN NEBU: 2.5000 mg | INHALATION_SOLUTION | Freq: Four times a day (QID) | RESPIRATORY_TRACT | Status: DC | PRN

## 2024-03-10 MED ORDER — HYDROCODONE-ACETAMINOPHEN 5-325 MG PO TABS: 1.0000 | ORAL_TABLET | Freq: Four times a day (QID) | ORAL | Status: DC | PRN

## 2024-03-10 MED ORDER — HYDROMORPHONE HCL 1 MG/ML IJ SOLN
0.5000 mg | INTRAMUSCULAR | Status: DC | PRN
  Administered 2024-03-10: 0.5 mg via INTRAVENOUS
  Filled 2024-03-10 (×2): qty 1

## 2024-03-10 MED ORDER — ALPRAZOLAM 0.5 MG PO TABS: 1.0000 mg | ORAL_TABLET | Freq: Two times a day (BID) | ORAL | Status: DC

## 2024-03-10 MED ORDER — LORATADINE 10 MG PO TABS
10.0000 mg | ORAL_TABLET | Freq: Every day | ORAL | Status: DC
  Administered 2024-03-10: 10 mg via ORAL
  Filled 2024-03-10: qty 1

## 2024-03-10 MED ORDER — ONDANSETRON HCL 4 MG/2ML IJ SOLN
4.0000 mg | Freq: Once | INTRAMUSCULAR | Status: AC
  Administered 2024-03-10: 4 mg via INTRAVENOUS
  Filled 2024-03-10: qty 2

## 2024-03-10 MED ORDER — ONDANSETRON HCL 4 MG PO TABS: 4.0000 mg | ORAL_TABLET | Freq: Four times a day (QID) | ORAL | Status: DC | PRN

## 2024-03-10 MED ORDER — ACETAMINOPHEN 650 MG RE SUPP: 650.0000 mg | Freq: Four times a day (QID) | RECTAL | Status: DC | PRN

## 2024-03-10 MED ORDER — KETOROLAC TROMETHAMINE 15 MG/ML IJ SOLN
15.0000 mg | Freq: Once | INTRAMUSCULAR | Status: AC
  Administered 2024-03-10: 15 mg via INTRAVENOUS
  Filled 2024-03-10: qty 1

## 2024-03-10 MED ORDER — SENNOSIDES-DOCUSATE SODIUM 8.6-50 MG PO TABS: 1.0000 | ORAL_TABLET | Freq: Every evening | ORAL | Status: DC | PRN

## 2024-03-10 MED ORDER — ONDANSETRON 4 MG PO TBDP
4.0000 mg | ORAL_TABLET | Freq: Once | ORAL | Status: DC
  Filled 2024-03-10 (×3): qty 1

## 2024-03-10 NOTE — ED Provider Notes (Signed)
  Provider Note MRN:  [id number]  Arrival date & time: 03/10/24    ED Course and Medical Decision Making  Assumed care from Dr Trine at shift change.  See note from prior team for complete details, in brief:  Clinical Course as of 03/10/24 1546  Sat Mar 10, 2024  0705 Handoff PEC 43 yo/f Here w/ right sided abd pain x1 day WBC+ CT - US  pend  [SG]  0803 US  wnl [SG]  1127 Pt unable to tolerate PO intake, still having ongoing abdominal pain that is unremitting despite multiple doses of analgesics. Other than leukocytosis and hepatic steatosis her workup is unrevealing. Discussed treatment options w/ pt and mother at bedside, will plan for obs for intractable pain/n/v. [SG]    Clinical Course User Index [SG] Elnor Jayson LABOR, DO   She previously saw [facility] secondary to her chronic abdominal pains  Patient with intractable abdominal pain.  Acute on chronic abdominal pain.  Intractable nausea and vomiting.  Plan admit    Procedures  Final Clinical Impressions(s) / ED Diagnoses     ICD-10-CM   1. Hepatic steatosis  K76.0     2. Intractable abdominal pain  R10.9       ED Discharge Orders     None       Discharge Instructions   None        Elnor Jayson LABOR, DO 03/10/24 1546

## 2024-03-10 NOTE — Plan of Care (Signed)

## 2024-03-10 NOTE — ED Notes (Signed)
 Patient given ham sandwich and cheese and apple sauce

## 2024-03-10 NOTE — ED Provider Notes (Signed)
 [facility] AT [facility] Provider Note  CSN: [id number] Arrival date & time: 03/10/24 0036  Chief Complaint(s) Abdominal Pain  HPI Pamela Bailey is a 43 y.o. female here for vomiting and lower abdominal pain associated with nausea without emesis.  No diarrhea.  No urinary symptoms.  Persistently worsening throughout the day.  Worse with movement and palpation mostly in the right lower quadrant.  The history is provided by the patient.    Past Medical History Past Medical History:  Diagnosis Date   Abnormal Pap smear    colpo 5/09   Anxiety    Asthma    Cleft hard palate    Cleft lip    Depression    Endometriosis    Facial palsy    Headache(784.0)    Ovarian cyst    PONV (postoperative nausea and vomiting)    Patient Active Problem List   Diagnosis Date Noted   Productive cough 10/07/2022   Encounter for medical examination to establish care 12/07/2017   Chronic midline low back pain without sciatica 09/09/2016   Weakness 06/21/2016   Left leg numbness 06/21/2016   Numbness of upper extremity 06/21/2016   Weakness of upper extremity 06/21/2016   Arm weakness 06/21/2016   Bilateral arm weakness    Hypokalemia    Leukocytosis    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   Chronic fatigue 07/16/2015   Anxiety 06/04/2015   Headache 08/30/2008   NAUSEA 08/30/2008   ABDOMINAL PAIN-MULTIPLE SITES 08/30/2008   RENAL FAILURE, ACUTE 08/29/2008   Home Medication(s) Prior to Admission medications   Medication Sig Start Date End Date Taking? Authorizing Provider  albuterol  (VENTOLIN  HFA) 108 (90 Base) MCG/ACT inhaler Inhale 1-2 puffs into the lungs every 6 (six) hours as needed. 10/07/22   Corlis Burnard DEL, NP  ALPRAZolam  (XANAX ) 1 MG tablet Take 1 mg by mouth 3 (three) times daily as needed for anxiety.     [provider]  cyclobenzaprine  (FLEXERIL ) 10 MG tablet Take 10 mg by mouth 3 (three) times daily as needed for muscle spasms.    [provider]  escitalopram (LEXAPRO) 20 MG tablet Take 20 mg by mouth at bedtime.    [provider]  fluticasone  (FLONASE ) 50 MCG/ACT nasal spray Place 1 spray into both nostrils daily. 01/26/23   Corlis Burnard DEL, NP  HYDROcodone -acetaminophen  (NORCO) 10-325 MG tablet Take 1 tablet by mouth 3 (three) times daily as needed (for pain). 11/10/21   [provider]  naproxen  (NAPROSYN ) 375 MG tablet Take 1 tablet (375 mg total) by mouth 2 (two) times daily with a meal. 03/01/24   Christopher Savannah, PA-C  ondansetron  (ZOFRAN -ODT) 8 MG disintegrating tablet Take 8 mg by mouth every 8 (eight) hours as needed for nausea or vomiting (dissolve orally).    [provider]  Vitamin D, Ergocalciferol, (DRISDOL) 1.25 MG (50000 UNIT) CAPS capsule Take 50,000 Units by mouth once a week. 09/29/20   [provider]  Allergies Azithromycin , Vancomycin , and Oxycodone   Review of Systems Review of Systems As noted in HPI  Physical Exam Vital Signs  I have reviewed the triage vital signs BP 133/85 (BP Location: Right Arm)   Pulse 86   Temp 97.6 F (36.4 C)   Resp 18   LMP 06/03/2014   SpO2 99%   Physical Exam Vitals reviewed.  Constitutional:      General: She is not in acute distress.    Appearance: She is well-developed. She is not diaphoretic.  HENT:     Head: Normocephalic and atraumatic.     Right Ear: External ear normal.     Left Ear: External ear normal.     Nose: Nose normal.   Eyes:     General: No scleral icterus.    Conjunctiva/sclera: Conjunctivae normal.   Neck:     Trachea: Phonation normal.   Cardiovascular:     Rate and Rhythm: Normal rate and regular rhythm.  Pulmonary:     Effort: Pulmonary effort is normal. No respiratory distress.     Breath sounds: No stridor.  Abdominal:     General: There is no distension.      Tenderness: There is abdominal tenderness in the right lower quadrant and suprapubic area. Positive signs include Rovsing's sign and McBurney's sign.   Musculoskeletal:        General: Normal range of motion.     Cervical back: Normal range of motion.   Neurological:     Mental Status: She is alert and oriented to person, place, and time.   Psychiatric:        Behavior: Behavior normal.     ED Results and Treatments Labs (all labs ordered are listed, but only abnormal results are displayed) Labs Reviewed  COMPREHENSIVE METABOLIC PANEL WITH GFR - Abnormal; Notable for the following components:      Result Value   Glucose, Bld 109 (*)    All other components within normal limits  URINALYSIS, ROUTINE W REFLEX MICROSCOPIC - Abnormal; Notable for the following components:   Color, Urine COLORLESS (*)    Specific Gravity, Urine 1.004 (*)    Hgb urine dipstick SMALL (*)    All other components within normal limits  CBC WITH DIFFERENTIAL/PLATELET - Abnormal; Notable for the following components:   WBC 16.4 (*)    Neutro Abs 11.1 (*)    Monocytes Absolute 1.2 (*)    Abs Immature Granulocytes 0.09 (*)    All other components within normal limits  LIPASE, BLOOD                                                                                                                         EKG  EKG Interpretation Date/Time:    Ventricular Rate:    PR Interval:    QRS Duration:    QT Interval:    QTC Calculation:   R Axis:      Text Interpretation:  Radiology CT ABDOMEN PELVIS W CONTRAST Result Date: 03/10/2024 EXAM: CT ABDOMEN AND PELVIS WITH CONTRAST 03/10/2024 05:07:49 AM TECHNIQUE: CT of the abdomen and pelvis was performed with the administration of intravenous contrast. Multiplanar reformatted images are provided for review. Automated exposure control, iterative reconstruction, and/or weight based adjustment of the mA/kV was utilized to reduce the radiation dose to as low as  reasonably achievable. COMPARISON: CT of the abdomen and pelvis 11/02/2023. CLINICAL HISTORY: RLQ abdominal pain. FINDINGS: LOWER CHEST: No acute abnormality. LIVER: Diffuse fatty infiltration of the liver is new since the prior exam. No discrete lesions are present. GALLBLADDER AND BILE DUCTS: The common bile duct and gallbladder are normal. SPLEEN: No acute abnormality. PANCREAS: No acute abnormality. ADRENAL GLANDS: No acute abnormality. KIDNEYS, URETERS AND BLADDER: No stones in the kidneys or ureters. No hydronephrosis. No perinephric or periureteral stranding. Urinary bladder is unremarkable. GI AND BOWEL: The appendix is visualized and normal. Stomach demonstrates no acute abnormality. There is no bowel obstruction. No bowel wall thickening. PERITONEUM AND RETROPERITONEUM: No ascites. No free air. VASCULATURE: Minimal scattered atherosclerotic disease evaluations are present in the aorta and branch vessels. No aneurysm or focal stenosis is present. LYMPH NODES: No lymphadenopathy. REPRODUCTIVE ORGANS: No acute abnormality. BONES AND SOFT TISSUES: No acute osseous abnormality. No focal soft tissue abnormality. IMPRESSION: 1. New diffuse hepatic steatosis since the prior exam. No discrete hepatic lesions. 2. No other acute findings in the abdomen or pelvis. Electronically signed by: Lonni Necessary MD 03/10/2024 05:59 AM EDT RP Workstation: [id number]    Medications Ordered in ED Medications  HYDROmorphone  (DILAUDID ) injection 0.5 mg (0.5 mg Intravenous Given 03/10/24 0244)  sodium chloride  0.9 % bolus 1,000 mL (0 mLs Intravenous Stopped 03/10/24 0449)  ondansetron  (ZOFRAN ) injection 4 mg (4 mg Intravenous Given 03/10/24 0254)  iohexol  (OMNIPAQUE ) 300 MG/ML solution 100 mL (100 mLs Intravenous Contrast Given 03/10/24 0453)  metoCLOPramide  (REGLAN ) injection 10 mg (10 mg Intravenous Given 03/10/24 0644)   Procedures Procedures  (including critical care time) Medical Decision Making / ED Course    Medical Decision Making Amount and/or Complexity of Data Reviewed Labs: ordered. Decision-making details documented in ED Course. Radiology: ordered and independent interpretation performed. Decision-making details documented in ED Course.  Risk Prescription drug management. Parenteral controlled substances. Decision regarding hospitalization.    Right-sided abdominal pain Differential diagnosis considered.  Workup below.  CBC with leukocytosis.  No anemia.  Metabolic panel without significant electrolyte derangement or renal sufficiency.  No evidence of bili obstruction or pancreatitis.   UA not concerning for infection. CT scan obtained to assess for evidence of appendicitis.  Negative.  Right upper quadrant ultrasound reviewed.  Ordered to rule out acute cholecystitis.  Patient care turned over to oncoming provider. Patient case and results discussed in detail; please see their note for further ED managment.      Clinical Course as of 03/10/24 0741  Sat Mar 10, 2024  0705 Handoff PEC 43 yo/f Here w/ right sided abd pain x1 day WBC+ CT - US  pend  [SG]    Clinical Course User Index [SG] Elnor Jayson LABOR, DO    Final Clinical Impression(s) / ED Diagnoses Final diagnoses:  None    This chart was dictated using voice recognition software.  Despite best efforts to proofread,  errors can occur which can change the documentation meaning.    Trine Raynell Moder, MD 03/10/24 [phone]

## 2024-03-10 NOTE — Discharge Instructions (Signed)
 Pamela Bailey, It was a pleasure taking care of you at [facility]. You were admitted for intractable abdominal pain with your pain improved on day of admission.  You were able to tolerate dinner so you are now being discharged home per your request. Please follow the following instructions.  1) continue taking your Norco 5-325 mg as needed for pain 2) schedule an appointment with your stomach doctor for hospital follow-up and to complete a HIDA scan 3) continue taking all your medications as prescribed  Take care,  Dr. Claretta Alderman, MD, MPH

## 2024-03-10 NOTE — Discharge Summary (Signed)
 Physician Discharge Summary   Patient: Pamela Bailey MRN: [id number] DOB: December 12, 1980  Admit date:     03/10/2024  Discharge date: 03/10/24  Discharge Physician: Claretta CHRISTELLA Alderman   PCP: Leron Millman, NP   Recommendations at discharge:   Follow-up on patient's abdominal pain Ensure patient follows up with GI and schedule her HIDA scan  Discharge Diagnoses: Active Problems:   Chronic abdominal pain  Principal Problem (Resolved):   Intractable abdominal pain  Hospital Course: Pamela Bailey is a 43 y.o. female with medical history significant for asthma, depression, fibromyalgia, endometriosis, anxiety and chronic abdominal pain who presented to the ED for evaluation of abdominal pain.  CT and ultrasound of the abdomen did not show any acute findings.  Patient's pain improved after she was admitted and moved to the floor.  She requested to be discharged due to improvement in her abdominal pain.  She was discharged home to continue home pain medications and to follow-up with GI.  Assessment and Plan: # Intractable abdominal pain # Acute on chronic abdominal pain - Patient with history of chronic abdominal pain presented with worsening abdominal pain - Abdominal imaging did not show any acute abnormalities - Patient well-appearing, afebrile with only mild tenderness to palpation of the abdomen - Patient symptoms improved after admission without any use of IV pain medications - Patient was able to tolerate dinner without worsening abdominal pain, nausea or vomiting - She asked to be discharged home due to improvement in her main presenting symptom - Discussed plan for her to continue her home Norco and follow-up with GI to schedule the HIDA scan - Continue home Norco 5-325 mg as needed for pain  # Fibromyalgia # Endometriosis - Reports she follows with pain clinic - Continue Wellbutrin  and as needed Flexeril   # Anxiety - Continue as needed Xanax   # Asthma - Continue as  needed albuterol  nebulizer      Pain control - [place]  Controlled Substance Reporting System database was reviewed. and patient was instructed, not to drive, operate heavy machinery, perform activities at heights, swimming or participation in water activities or provide baby-sitting services while on Pain, Sleep and Anxiety Medications; until their outpatient Physician has advised to do so again. Also recommended to not to take more than prescribed Pain, Sleep and Anxiety Medications.   Consultants: None Procedures performed: None Disposition: Home Diet recommendation:  Discharge Diet Orders (From admission, onward)     Start     Ordered   03/10/24 0000  Diet - low sodium heart healthy        03/10/24 1911           Regular diet  DISCHARGE MEDICATION: Allergies as of 03/10/2024       Reactions   Azithromycin  Other (See Comments)   Caused renal failure (On 11/19/16, patient states this did NOT??)   Vancomycin  Other (See Comments)   Acute kidney injury   Oxycodone  Nausea And Vomiting        Medication List     TAKE these medications    albuterol  108 (90 Base) MCG/ACT inhaler Commonly known as: VENTOLIN  HFA Inhale 1-2 puffs into the lungs every 6 (six) hours as needed. What changed:  how much to take reasons to take this   ALPRAZolam  1 MG tablet Commonly known as: XANAX  Take 1 mg by mouth in the morning and at bedtime.   buPROPion  300 MG 24 hr tablet Commonly known as: WELLBUTRIN  XL Take 300 mg by mouth daily.  cetirizine 10 MG tablet Commonly known as: ZYRTEC Take 10 mg by mouth daily.   cyclobenzaprine  10 MG tablet Commonly known as: FLEXERIL  Take 10 mg by mouth 3 (three) times daily as needed for muscle spasms.   diclofenac 50 MG EC tablet Commonly known as: VOLTAREN Take 50 mg by mouth daily as needed for moderate pain (pain score 4-6) or mild pain (pain score 1-3).   naproxen  375 MG tablet Commonly known as: NAPROSYN  Take 1 tablet (375  mg total) by mouth 2 (two) times daily with a meal.   ondansetron  8 MG disintegrating tablet Commonly known as: ZOFRAN -ODT Take 8 mg by mouth daily as needed for nausea or vomiting.   Vitamin D (Ergocalciferol) 1.25 MG (50000 UNIT) Caps capsule Commonly known as: DRISDOL Take 50,000 Units by mouth once a week.        Follow-up Information     Leron Millman, NP. Schedule an appointment as soon as possible for a visit in 1 week(s).   Specialty: Nurse Practitioner Contact information: [address] [place] [address] [phone]         [facility]. Schedule an appointment as soon as possible for a visit in 1 week(s).   Specialty: Gastroenterology Contact information: [address] [place]  [address] [phone]                Discharge Exam: Pamela Bailey   03/10/24 1400 03/10/24 1409  Weight: 37.7 kg 38.7 kg   General: Pleasant, well-appearing middle-age laying in bed. Appears older than stated age.  No acute distress. HEENT: [place]/AT.  Cleft lip and palate, mild chronic right facial droop CV: RRR. No murmurs, rubs, or gallops. No LE edema Pulmonary: Lungs CTAB. Normal effort. No wheezing or rales. Abdominal: Soft, nondistended. Mild ttp of the lower abdomen and RUQ. Normal bowel sounds. Extremities: Palpable radial and DP pulses. Normal ROM. Skin: Warm and dry. No obvious rash or lesions. Neuro: A&Ox3. Moves all extremities. Normal sensation to light touch. No focal deficit. Psych: Normal mood and affect  Condition at discharge: good  The results of significant diagnostics from this hospitalization (including imaging, microbiology, ancillary and laboratory) are listed below for reference.   Imaging Studies: US  Abdomen Limited RUQ (LIVER/GB) Result Date: 03/10/2024 CLINICAL DATA:  Right upper quadrant pain EXAM: ULTRASOUND ABDOMEN LIMITED RIGHT UPPER QUADRANT COMPARISON:  CT from earlier the same day  FINDINGS: Gallbladder: No gallstones or wall thickening visualized. No sonographic Murphy sign noted by sonographer. Common bile duct: Diameter: 2 mm.  No intrahepatic biliary ductal dilatation noted. Liver: No focal lesion identified. Within normal limits in parenchymal echogenicity. Portal vein is patent on color Doppler imaging with normal direction of blood flow towards the liver. Other: None. IMPRESSION: Negative. Electronically Signed   By: JONETTA Faes M.D.   On: 03/10/2024 07:57   CT ABDOMEN PELVIS W CONTRAST Result Date: 03/10/2024 EXAM: CT ABDOMEN AND PELVIS WITH CONTRAST 03/10/2024 05:07:49 AM TECHNIQUE: CT of the abdomen and pelvis was performed with the administration of intravenous contrast. Multiplanar reformatted images are provided for review. Automated exposure control, iterative reconstruction, and/or weight based adjustment of the mA/kV was utilized to reduce the radiation dose to as low as reasonably achievable. COMPARISON: CT of the abdomen and pelvis 11/02/2023. CLINICAL HISTORY: RLQ abdominal pain. FINDINGS: LOWER CHEST: No acute abnormality. LIVER: Diffuse fatty infiltration of the liver is new since the prior exam. No discrete lesions are present. GALLBLADDER AND BILE DUCTS: The common bile duct  and gallbladder are normal. SPLEEN: No acute abnormality. PANCREAS: No acute abnormality. ADRENAL GLANDS: No acute abnormality. KIDNEYS, URETERS AND BLADDER: No stones in the kidneys or ureters. No hydronephrosis. No perinephric or periureteral stranding. Urinary bladder is unremarkable. GI AND BOWEL: The appendix is visualized and normal. Stomach demonstrates no acute abnormality. There is no bowel obstruction. No bowel wall thickening. PERITONEUM AND RETROPERITONEUM: No ascites. No free air. VASCULATURE: Minimal scattered atherosclerotic disease evaluations are present in the aorta and branch vessels. No aneurysm or focal stenosis is present. LYMPH NODES: No lymphadenopathy. REPRODUCTIVE ORGANS:  No acute abnormality. BONES AND SOFT TISSUES: No acute osseous abnormality. No focal soft tissue abnormality. IMPRESSION: 1. New diffuse hepatic steatosis since the prior exam. No discrete hepatic lesions. 2. No other acute findings in the abdomen or pelvis. Electronically signed by: Lonni Necessary MD 03/10/2024 05:59 AM EDT RP Workstation: [id number]    Microbiology: Results for orders placed or performed during the hospital encounter of 11/02/23  Resp panel by RT-PCR (RSV, Flu A&B, Covid) Anterior Nasal Swab     Status: None   Collection Time: 11/02/23 12:31 PM   Specimen: Anterior Nasal Swab  Result Value Ref Range Status   SARS Coronavirus 2 by RT PCR NEGATIVE NEGATIVE Final    Comment: (NOTE) SARS-CoV-2 target nucleic acids are NOT DETECTED.  The SARS-CoV-2 RNA is generally detectable in upper respiratory specimens during the acute phase of infection. The lowest concentration of SARS-CoV-2 viral copies this assay can detect is 138 copies/mL. A negative result does not preclude SARS-Cov-2 infection and should not be used as the sole basis for treatment or other patient management decisions. A negative result may occur with  improper specimen collection/handling, submission of specimen other than nasopharyngeal swab, presence of viral mutation(s) within the areas targeted by this assay, and inadequate number of viral copies(<138 copies/mL). A negative result must be combined with clinical observations, patient history, and epidemiological information. The expected result is Negative.  Fact Sheet for Patients:  BloggerCourse.com  Fact Sheet for Healthcare Providers:  SeriousBroker.it  This test is no t yet approved or cleared by the [place]  FDA and  has been authorized for detection and/or diagnosis of SARS-CoV-2 by FDA under an Emergency Use Authorization (EUA). This EUA will remain  in effect (meaning this test can be  used) for the duration of the COVID-19 declaration under Section 564(b)(1) of the Act, 21 U.S.C.section 360bbb-3(b)(1), unless the authorization is terminated  or revoked sooner.       Influenza A by PCR NEGATIVE NEGATIVE Final   Influenza B by PCR NEGATIVE NEGATIVE Final    Comment: (NOTE) The Xpert Xpress SARS-CoV-2/FLU/RSV plus assay is intended as an aid in the diagnosis of influenza from Nasopharyngeal swab specimens and should not be used as a sole basis for treatment. Nasal washings and aspirates are unacceptable for Xpert Xpress SARS-CoV-2/FLU/RSV testing.  Fact Sheet for Patients: BloggerCourse.com  Fact Sheet for Healthcare Providers: SeriousBroker.it  This test is not yet approved or cleared by the [place]  FDA and has been authorized for detection and/or diagnosis of SARS-CoV-2 by FDA under an Emergency Use Authorization (EUA). This EUA will remain in effect (meaning this test can be used) for the duration of the COVID-19 declaration under Section 564(b)(1) of the Act, 21 U.S.C. section 360bbb-3(b)(1), unless the authorization is terminated or revoked.     Resp Syncytial Virus by PCR NEGATIVE NEGATIVE Final    Comment: (NOTE) Fact Sheet for Patients: BloggerCourse.com  Fact Sheet for  Healthcare Providers: SeriousBroker.it  This test is not yet approved or cleared by the [place]  FDA and has been authorized for detection and/or diagnosis of SARS-CoV-2 by FDA under an Emergency Use Authorization (EUA). This EUA will remain in effect (meaning this test can be used) for the duration of the COVID-19 declaration under Section 564(b)(1) of the Act, 21 U.S.C. section 360bbb-3(b)(1), unless the authorization is terminated or revoked.  Performed at [facility], [address] [place], [place] [address]     Labs: CBC: Recent Labs  Lab  03/10/24 0056  WBC 16.4*  NEUTROABS 11.1*  HGB 13.8  HCT 41.7  MCV 96.5  PLT 225   Basic Metabolic Panel: Recent Labs  Lab 03/10/24 0056  NA 139  K 3.5  CL 102  CO2 26  GLUCOSE 109*  BUN 19  CREATININE 0.57  CALCIUM  9.5   Liver Function Tests: Recent Labs  Lab 03/10/24 0056  AST 22  ALT 21  ALKPHOS 86  BILITOT 0.5  PROT 7.2  ALBUMIN 4.4   CBG: No results for input(s): GLUCAP in the last 168 hours.  Discharge time spent: 25 mins  Signed: Claretta CHRISTELLA Alderman, MD [facility] 03/10/2024

## 2024-03-10 NOTE — H&P (Addendum)
 History and Physical  Pamela Bailey FMW:[id number] DOB: Aug 15, 1981 DOA: 03/10/2024  PCP: Leron Millman, NP   Chief Complaint: Abdominal pain  HPI: Pamela Bailey is a 43 y.o. female with medical history significant for asthma, depression, fibromyalgia, endometriosis, anxiety and chronic abdominal pain who presented to the ED for evaluation of abdominal pain.  Patient has known chronic RUQ has been evaluated by GI in the past with no identifiable cause.  Reports she is on chronic pain medications which manages her pain.  However since last night, her abdominal pain has worsened with no relief with her home Norco.  The pain is worse with movement.  She endorsed associated nausea but denies any vomiting.  ED Course: Initial vitals were stable. Initial labs significant for WBC 16.4 otherwise unremarkable CBC, normal kidney function, normal LFTs and UA with no signs of infection.  CT A/P shows diffuse hepatic steatosis but no acute intra-abdominal or pelvic pathology. U/S RUQ with no acute abnormalities.  Pt received IV Pepcid , IV Zofran  4 mg x 2, Toradol  15 mg x 1, Norco 5-325 mg x 1 and IV NS 1 L bolus x 1. TR[facility] was consulted for admission.   Review of Systems: Please see HPI for pertinent positives and negatives. A complete 10 system review of systems are otherwise negative.  Past Medical History:  Diagnosis Date   Abnormal Pap smear    colpo 5/09   Anxiety    Asthma    Cleft hard palate    Cleft lip    Depression    Endometriosis    Facial palsy    Headache(784.0)    Ovarian cyst    PONV (postoperative nausea and vomiting)    Past Surgical History:  Procedure Laterality Date   ABDOMINAL HYSTERECTOMY     ABDOMINAL SURGERY     BREAST BIOPSY Right 2015   neg   COSMETIC SURGERY     LAPAROSCOPY     SALPINGECTOMY     TYMPANOSTOMY TUBE PLACEMENT     UPPER GASTROINTESTINAL ENDOSCOPY     Social History:  reports that she has been smoking cigarettes. She has a 4 pack-year smoking  history. She has never used smokeless tobacco. She reports current alcohol use. She reports that she does not use drugs.  Allergies  Allergen Reactions   Azithromycin  Other (See Comments)    Caused renal failure (On 11/19/16, patient states this did NOT??)   Vancomycin  Other (See Comments)    Acute kidney injury    Oxycodone  Nausea And Vomiting    Family History  Problem Relation Age of Onset   Chronic fatigue Mother    Stroke Father    Breast cancer Sister        1/2 sister paternal   Breast cancer Maternal Grandmother    Colon cancer Maternal Grandmother    Hypertension Maternal Grandfather    Anesthesia problems Neg Hx    Esophageal cancer Neg Hx    Stomach cancer Neg Hx      Prior to Admission medications   Medication Sig Start Date End Date Taking? Authorizing Provider  albuterol  (VENTOLIN  HFA) 108 (90 Base) MCG/ACT inhaler Inhale 1-2 puffs into the lungs every 6 (six) hours as needed. Patient taking differently: Inhale 2 puffs into the lungs every 6 (six) hours as needed for shortness of breath or wheezing. 10/07/22  Yes Corlis Burnard DEL, NP  ALPRAZolam  (XANAX ) 1 MG tablet Take 1 mg by mouth in the morning and at bedtime.   Yes [provider]  buPROPion  (WELLBUTRIN  XL) 300 MG 24 hr tablet Take 300 mg by mouth daily.   Yes [provider]  cetirizine (ZYRTEC) 10 MG tablet Take 10 mg by mouth daily. 03/03/24  Yes [provider]  cyclobenzaprine  (FLEXERIL ) 10 MG tablet Take 10 mg by mouth 3 (three) times daily as needed for muscle spasms.   Yes [provider]  diclofenac (VOLTAREN) 50 MG EC tablet Take 50 mg by mouth daily as needed for moderate pain (pain score 4-6) or mild pain (pain score 1-3).   Yes [provider]  HYDROcodone -acetaminophen  (NORCO) 10-325 MG tablet Take 1 tablet by mouth 3 (three) times daily as needed for moderate pain (pain score 4-6) or severe pain (pain score 7-10). 11/10/21  Yes [provider]   ondansetron  (ZOFRAN -ODT) 8 MG disintegrating tablet Take 8 mg by mouth daily as needed for nausea or vomiting.   Yes [provider]  naproxen  (NAPROSYN ) 375 MG tablet Take 1 tablet (375 mg total) by mouth 2 (two) times daily with a meal. Patient not taking: Reported on 03/10/2024 03/01/24   Christopher Savannah, PA-C  Vitamin D, Ergocalciferol, (DRISDOL) 1.25 MG (50000 UNIT) CAPS capsule Take 50,000 Units by mouth once a week. Patient not taking: Reported on 03/10/2024 09/29/20   [provider]    Physical Exam: BP 119/72 (BP Location: Right Arm)   Pulse 65   Temp 97.8 F (36.6 C) (Oral)   Resp 18   LMP 06/03/2014   SpO2 99%  General: Pleasant, well-appearing middle-age laying in bed. Appears older than stated age.  No acute distress. HEENT: [place]/AT.  Cleft lip and palate, mild chronic right facial droop CV: RRR. No murmurs, rubs, or gallops. No LE edema Pulmonary: Lungs CTAB. Normal effort. No wheezing or rales. Abdominal: Soft, nondistended. Mild ttp of the lower abdomen and RUQ. Normal bowel sounds. Extremities: Palpable radial and DP pulses. Normal ROM. Skin: Warm and dry. No obvious rash or lesions. Neuro: A&Ox3. Moves all extremities. Normal sensation to light touch. No focal deficit. Psych: Normal mood and affect          Labs on Admission:  Basic Metabolic Panel: Recent Labs  Lab 03/10/24 0056  NA 139  K 3.5  CL 102  CO2 26  GLUCOSE 109*  BUN 19  CREATININE 0.57  CALCIUM  9.5   Liver Function Tests: Recent Labs  Lab 03/10/24 0056  AST 22  ALT 21  ALKPHOS 86  BILITOT 0.5  PROT 7.2  ALBUMIN 4.4   Recent Labs  Lab 03/10/24 0056  LIPASE 47   No results for input(s): AMMONIA in the last 168 hours. CBC: Recent Labs  Lab 03/10/24 0056  WBC 16.4*  NEUTROABS 11.1*  HGB 13.8  HCT 41.7  MCV 96.5  PLT 225   Cardiac Enzymes: No results for input(s): CKTOTAL, CKMB, CKMBINDEX, TROPONINI in the last 168 hours. BNP (last 3 results) No  results for input(s): BNP in the last 8760 hours.  ProBNP (last 3 results) No results for input(s): PROBNP in the last 8760 hours.  CBG: No results for input(s): GLUCAP in the last 168 hours.  Radiological Exams on Admission: US  Abdomen Limited RUQ (LIVER/GB) Result Date: 03/10/2024 CLINICAL DATA:  Right upper quadrant pain EXAM: ULTRASOUND ABDOMEN LIMITED RIGHT UPPER QUADRANT COMPARISON:  CT from earlier the same day FINDINGS: Gallbladder: No gallstones or wall thickening visualized. No sonographic Murphy sign noted by sonographer. Common bile duct: Diameter: 2 mm.  No intrahepatic biliary ductal  dilatation noted. Liver: No focal lesion identified. Within normal limits in parenchymal echogenicity. Portal vein is patent on color Doppler imaging with normal direction of blood flow towards the liver. Other: None. IMPRESSION: Negative. Electronically Signed   By: JONETTA Faes M.D.   On: 03/10/2024 07:57   CT ABDOMEN PELVIS W CONTRAST Result Date: 03/10/2024 EXAM: CT ABDOMEN AND PELVIS WITH CONTRAST 03/10/2024 05:07:49 AM TECHNIQUE: CT of the abdomen and pelvis was performed with the administration of intravenous contrast. Multiplanar reformatted images are provided for review. Automated exposure control, iterative reconstruction, and/or weight based adjustment of the mA/kV was utilized to reduce the radiation dose to as low as reasonably achievable. COMPARISON: CT of the abdomen and pelvis 11/02/2023. CLINICAL HISTORY: RLQ abdominal pain. FINDINGS: LOWER CHEST: No acute abnormality. LIVER: Diffuse fatty infiltration of the liver is new since the prior exam. No discrete lesions are present. GALLBLADDER AND BILE DUCTS: The common bile duct and gallbladder are normal. SPLEEN: No acute abnormality. PANCREAS: No acute abnormality. ADRENAL GLANDS: No acute abnormality. KIDNEYS, URETERS AND BLADDER: No stones in the kidneys or ureters. No hydronephrosis. No perinephric or periureteral stranding. Urinary  bladder is unremarkable. GI AND BOWEL: The appendix is visualized and normal. Stomach demonstrates no acute abnormality. There is no bowel obstruction. No bowel wall thickening. PERITONEUM AND RETROPERITONEUM: No ascites. No free air. VASCULATURE: Minimal scattered atherosclerotic disease evaluations are present in the aorta and branch vessels. No aneurysm or focal stenosis is present. LYMPH NODES: No lymphadenopathy. REPRODUCTIVE ORGANS: No acute abnormality. BONES AND SOFT TISSUES: No acute osseous abnormality. No focal soft tissue abnormality. IMPRESSION: 1. New diffuse hepatic steatosis since the prior exam. No discrete hepatic lesions. 2. No other acute findings in the abdomen or pelvis. Electronically signed by: Lonni Necessary MD 03/10/2024 05:59 AM EDT RP Workstation: [id number]   Assessment/Plan Pamela Bailey is a 43 y.o. female with medical history significant for asthma, depression, fibromyalgia, endometriosis, anxiety and chronic abdominal pain who presented to the ED for evaluation of abdominal pain and admitted for intractable abdominal pain  # Intractable abdominal pain # Acute on chronic abdominal pain - Patient with history of chronic abdominal pain presented with worsening abdominal pain - Abdominal imaging did not show any acute abnormalities - Patient well-appearing, afebrile with only mild tenderness to palpation of the abdomen - Patient symptoms improved after admission without any use of IV pain medications - Patient was able to tolerate dinner without worsening abdominal pain, nausea or vomiting - She asked to be discharged home due to improvement in her main presenting symptom - Discussed plan for her to continue her home Norco and follow-up with GI to schedule the HIDA scan - Continue home Norco 5-325 mg as needed for pain  # Fibromyalgia # Endometriosis - Reports she follows with pain clinic - Continue Wellbutrin  and as needed Flexeril   # Anxiety - Continue as  needed Xanax   # Asthma - Continue as needed albuterol  nebulizer  DVT prophylaxis: Lovenox      Code Status: Full Code  Consults called: None  Family Communication: No family at bedside  Severity of Illness: The appropriate patient status for this patient is OBSERVATION. Observation status is judged to be reasonable and necessary in order to provide the required intensity of service to ensure the patient's safety. The patient's presenting symptoms, physical exam findings, and initial radiographic and laboratory data in the context of their medical condition is felt to place them at decreased risk for further clinical deterioration. Furthermore, it is  anticipated that the patient will be medically stable for discharge from the hospital within 2 midnights of admission.   Level of care: Med-Surg   Time spent:: 65 minutes  This record has been created using Conservation officer, historic buildings. Errors have been sought and corrected, but may not always be located. Such creation errors do not reflect on the standard of care.   Lou Claretta HERO, MD 03/10/2024, 1:16 PM [facility] Pager: [phone] Isaiah 41:10   If 7PM-7AM, please contact night-coverage [email or web address] Password [email or web address]

## 2024-03-10 NOTE — ED Triage Notes (Signed)
 Pt is coming in for abd pain, she has a Hx of fibromyalgia, but coming in for abd pain that started around midnight. Pt has had no vomiting, but she has had nausea. She called medic do to the pain not improving.   130hr 154/88 99%ra 107bgl 18rr  4mg  zofran  given  100mcg fentanyl  given by medic   20g right Ac

## 2024-03-22 ENCOUNTER — Other Ambulatory Visit (HOSPITAL_COMMUNITY): Payer: Self-pay | Admitting: Gastroenterology

## 2024-03-22 DIAGNOSIS — R1011 Right upper quadrant pain: Secondary | ICD-10-CM

## 2024-04-03 ENCOUNTER — Ambulatory Visit (HOSPITAL_COMMUNITY)
Admission: RE | Admit: 2024-04-03 | Discharge: 2024-04-03 | Disposition: A | Discharge status: Home / Self Care | Source: Ambulatory Visit | Attending: Gastroenterology | Admitting: Gastroenterology

## 2024-04-03 DIAGNOSIS — R1011 Right upper quadrant pain: Secondary | ICD-10-CM | POA: Diagnosis present

## 2024-04-03 MED ORDER — TECHNETIUM TC 99M MEBROFENIN IV KIT
5.0000 | PACK | Freq: Once | INTRAVENOUS | Status: AC
  Administered 2024-04-03: 5.35 via INTRAVENOUS

## 2024-04-15 ENCOUNTER — Ambulatory Visit: Payer: Self-pay | Admitting: General Surgery

## 2024-04-27 NOTE — Pre-Procedure Instructions (Signed)
 Surgical Instructions   Your procedure is scheduled on May 04, 2024. Report to [facility] Main Entrance A at 5:30 A.M., then check in with the Admitting office. Any questions or running late day of surgery: call [phone]  Questions prior to your surgery date: call [phone], Monday-Friday, 8am-4pm. If you experience any cold or flu symptoms such as cough, fever, chills, shortness of breath, etc. between now and your scheduled surgery, please notify us  at the above number.     Remember:  Do not eat or drink after midnight the night before your surgery    Take these medicines the morning of surgery with A SIP OF WATER: NONE   May take these medicines IF NEEDED: acetaminophen  (TYLENOL )  albuterol  (VENTOLIN  HFA) inhaler - please bring inhaler with you morning of surgery ALPRAZolam  (XANAX )  cyclobenzaprine  (FLEXERIL )  HYDROcodone -acetaminophen  (NORCO)  ondansetron  (ZOFRAN -ODT)    One week prior to surgery, STOP taking any Aspirin (unless otherwise instructed by your surgeon) Aleve , Naproxen , Ibuprofen , Motrin , Advil , Goody's, BC's, all herbal medications, fish oil, and non-prescription vitamins. This includes your medication: diclofenac (VOLTAREN) tablet                      Do NOT Smoke (Tobacco/Vaping) for 24 hours prior to your procedure.  If you use a CPAP at night, you may bring your mask/headgear for your overnight stay.   You will be asked to remove any contacts, glasses, piercing's, hearing aid's, dentures/partials prior to surgery. Please bring cases for these items if needed.    Patients discharged the day of surgery will not be allowed to drive home, and someone needs to stay with them for 24 hours.  SURGICAL WAITING ROOM VISITATION Patients may have no more than 2 support people in the waiting area - these visitors may rotate.   Pre-op nurse will coordinate an appropriate time for 1 ADULT support person, who may not rotate, to accompany patient in pre-op.   Children under the age of 81 must have an adult with them who is not the patient and must remain in the main waiting area with an adult.  If the patient needs to stay at the hospital during part of their recovery, the visitor guidelines for inpatient rooms apply.  Please refer to the [facility] website for the visitor guidelines for any additional information.   If you received a COVID test during your pre-op visit  it is requested that you wear a mask when out in public, stay away from anyone that may not be feeling well and notify your surgeon if you develop symptoms. If you have been in contact with anyone that has tested positive in the last 10 days please notify you surgeon.      Pre-operative CHG Bathing Instructions   You can play a key role in reducing the risk of infection after surgery. Your skin needs to be as free of germs as possible. You can reduce the number of germs on your skin by washing with CHG (chlorhexidine gluconate) soap before surgery. CHG is an antiseptic soap that kills germs and continues to kill germs even after washing.   DO NOT use if you have an allergy to chlorhexidine/CHG or antibacterial soaps. If your skin becomes reddened or irritated, stop using the CHG and notify one of our RNs at [phone].              TAKE A SHOWER THE NIGHT BEFORE SURGERY AND THE DAY OF SURGERY  Please keep in mind the following:  DO NOT shave, including legs and underarms, 48 hours prior to surgery.   You may shave your face before/day of surgery.  Place clean sheets on your bed the night before surgery Use a clean washcloth (not used since being washed) for each shower. DO NOT sleep with pet's night before surgery.  CHG Shower Instructions:  Wash your face and private area with normal soap. If you choose to wash your hair, wash first with your normal shampoo.  After you use shampoo/soap, rinse your hair and body thoroughly to remove shampoo/soap residue.  Turn the  water OFF and apply half the bottle of CHG soap to a CLEAN washcloth.  Apply CHG soap ONLY FROM YOUR NECK DOWN TO YOUR TOES (washing for 3-5 minutes)  DO NOT use CHG soap on face, private areas, open wounds, or sores.  Pay special attention to the area where your surgery is being performed.  If you are having back surgery, having someone wash your back for you may be helpful. Wait 2 minutes after CHG soap is applied, then you may rinse off the CHG soap.  Pat dry with a clean towel  Put on clean pajamas    Additional instructions for the day of surgery: DO NOT APPLY any lotions, deodorants, cologne, or perfumes.   Do not wear jewelry or makeup Do not wear nail polish, gel polish, artificial nails, or any other type of covering on natural nails (fingers and toes) Do not bring valuables to the hospital. [facility] is not responsible for valuables/personal belongings. Put on clean/comfortable clothes.  Please brush your teeth.  Ask your nurse before applying any prescription medications to the skin.

## 2024-04-30 ENCOUNTER — Encounter (HOSPITAL_COMMUNITY)
Admission: RE | Admit: 2024-04-30 | Discharge: 2024-04-30 | Disposition: A | Discharge status: Home / Self Care | Source: Ambulatory Visit | Attending: General Surgery | Admitting: General Surgery

## 2024-04-30 ENCOUNTER — Other Ambulatory Visit: Payer: Self-pay

## 2024-04-30 ENCOUNTER — Encounter (HOSPITAL_COMMUNITY): Payer: Self-pay

## 2024-04-30 VITALS — BP 114/71 | HR 69 | Temp 98.3°F | Resp 16 | Ht 61.0 in | Wt 84.4 lb

## 2024-04-30 DIAGNOSIS — M797 Fibromyalgia: Secondary | ICD-10-CM | POA: Diagnosis not present

## 2024-04-30 DIAGNOSIS — Z01812 Encounter for preprocedural laboratory examination: Secondary | ICD-10-CM | POA: Diagnosis present

## 2024-04-30 DIAGNOSIS — F17210 Nicotine dependence, cigarettes, uncomplicated: Secondary | ICD-10-CM | POA: Diagnosis not present

## 2024-04-30 DIAGNOSIS — F329 Major depressive disorder, single episode, unspecified: Secondary | ICD-10-CM | POA: Diagnosis not present

## 2024-04-30 DIAGNOSIS — K760 Fatty (change of) liver, not elsewhere classified: Secondary | ICD-10-CM | POA: Diagnosis not present

## 2024-04-30 DIAGNOSIS — J45909 Unspecified asthma, uncomplicated: Secondary | ICD-10-CM | POA: Insufficient documentation

## 2024-04-30 DIAGNOSIS — F419 Anxiety disorder, unspecified: Secondary | ICD-10-CM | POA: Insufficient documentation

## 2024-04-30 DIAGNOSIS — R1011 Right upper quadrant pain: Secondary | ICD-10-CM | POA: Diagnosis not present

## 2024-04-30 DIAGNOSIS — Z01818 Encounter for other preprocedural examination: Secondary | ICD-10-CM

## 2024-04-30 HISTORY — DX: Fibromyalgia: M79.7

## 2024-04-30 LAB — CBC
HCT: 43.8 % (ref 36.0–46.0)
Hemoglobin: 14.5 g/dL (ref 12.0–15.0)
MCH: 31.7 pg (ref 26.0–34.0)
MCHC: 33.1 g/dL (ref 30.0–36.0)
MCV: 95.6 fL (ref 80.0–100.0)
Platelets: 219 K/uL (ref 150–400)
RBC: 4.58 MIL/uL (ref 3.87–5.11)
RDW: 12.3 % (ref 11.5–15.5)
WBC: 9.5 K/uL (ref 4.0–10.5)
nRBC: 0 % (ref 0.0–0.2)

## 2024-04-30 NOTE — Progress Notes (Signed)
 PCP - Heron Fear at [facility] Cardiologist - Dr. Jeronimo with [facility]  PPM/ICD - Denies Device Orders - n/a Rep Notified - n/a  Chest x-ray - n/a EKG - 11/02/2023 Stress Test - Denies ECHO - 06/21/2016 Cardiac Cath - Denies  Sleep Study - Denies CPAP - n/a  No DM  Last dose of GLP1 agonist- n/a GLP1 instructions: n/a  Blood Thinner Instructions: n/a Aspirin Instructions: n/a  NPO after midnight  COVID TEST- n/a   Anesthesia review: Yes. Records requested from [facility] Discussed with Lynwood Hope, PA-C. Pt was consulted by cardiology for CP (which she is relating to her gallbladder). Pt was supposed to complete a stress test 8/11 but decided not to go. She was also ordered a heart monitor for which she wore one week and then decided that she didn't want to complete/turn it in.   Patient denies shortness of breath, fever, cough and chest pain at PAT appointment. Pt denies any respiratory illness/infection in the last two months.   All instructions explained to the patient, with a verbal understanding of the material. Patient agrees to go over the instructions while at home for a better understanding. Patient also instructed to self quarantine after being tested for COVID-19. The opportunity to ask questions was provided.

## 2024-05-01 NOTE — Progress Notes (Signed)
 Anesthesia Chart Review:  43 year old female current smoker with pertinent history including PONV, anxiety, asthma, fibromyalgia, depression, right facial palsy.  She has had multiple ED evaluations for intractable abdominal pain with benign evaluation.  CT of the abdomen and pelvis 03/10/2024 showed diffuse hepatic steatosis, no other acute findings.  Right upper quadrant ultrasound 03/10/2024 was normal.  Nuclear medicine hepatobiliary imaging 04/03/2024 showed no common duct or cystic duct obstruction, normal gallbladder ejection fraction of 84%. Patient reports she had prior EGD at [facility] without significant findings.  She was ultimately seen by general surgery and her symptoms were felt to be consistent with biliary dyskinesia.   She also had recent ED evaluation at [facility] on 01/15/2024 presenting with diffuse sharp/atypical left chest pain, left neck pain, left arm pain, left upper quadrant pain.  Chest pain resolved spontaneously.  Cardiac workup was benign, EKG without ischemic changes, troponin normal, ACS ruled out.  Symptoms were suspected secondary to fibromyalgia/musculoskeletal pain or possibly pleurisy/bronchitis related pain.  She was prescribed doxycycline  for cough and discharged with PCP follow-up.  Patient states she did follow-up with her PCP at [facility].  She said cardiac event monitor was ordered and she was referred to cardiologist Dr. Jeronimo at [facility].  She states she did not complete the event monitor and did not follow through with cardiology appointment as she was asymptomatic.  She states her only current complaint is right upper quadrant pain.  Preop labs reviewed, WNL.  Reviewed history with anesthesiologist Dr. Keneth.  Advised okay to proceed as planned barring acute status change.  Will be evaluated day of surgery by assigned anesthesiologist.  EKG 01/15/2024 (Care Everywhere): NSR.  Incomplete right bundle branch block.  Rate 90.  EKG 11/02/23: Sinus  rhythm. Rate 79. Consider right atrial enlargement. RSR' in V1 or V2, probably normal variant   TTE 06/21/2016: - Procedure narrative: Transthoracic echocardiography. Image    quality was adequate. The study was technically difficult.  - Left ventricle: The cavity size was normal. Systolic function was    normal. The estimated ejection fraction was in the range of 60%    to 65%. Wall motion was normal; there were no regional wall    motion abnormalities. Left ventricular diastolic function    parameters were normal.  - Aortic valve: Transvalvular velocity was within the normal range.    There was no stenosis. There was no regurgitation.  - Mitral valve: Transvalvular velocity was within the normal range.    There was no evidence for stenosis. There was no regurgitation.  - Right ventricle: The cavity size was normal. Wall thickness was    normal. Systolic function was normal.  - Tricuspid valve: There was no regurgitation.  - Pericardium, extracardiac: There was a left pleural effusion.     [name] [facility] Short Stay Center/Anesthesiology Phone [phone] 05/02/2024 4:13 PM

## 2024-05-02 NOTE — Anesthesia Preprocedure Evaluation (Addendum)
 Anesthesia Evaluation  Patient identified by MRN, date of birth, ID band Patient awake    Reviewed: Allergy & Precautions, H&P , NPO status , Patient's Chart, lab work & pertinent test results  History of Anesthesia Complications (+) PONV and history of anesthetic complications  Airway Mallampati: III  TM Distance: >3 FB Neck ROM: Full    Dental  (+) Poor Dentition, Partial Lower, Dental Advisory Given   Pulmonary asthma , Current Smoker and Patient abstained from smoking.   Pulmonary exam normal breath sounds clear to auscultation       Cardiovascular (-) Past MI Normal cardiovascular exam Rhythm:Regular Rate:Normal     Neuro/Psych  Headaches, neg Seizures PSYCHIATRIC DISORDERS Anxiety Depression       GI/Hepatic Neg liver ROS,,,BILIARY DYSKINESIA   Endo/Other  negative endocrine ROS    Renal/GU negative Renal ROS  negative genitourinary   Musculoskeletal  (+)  Fibromyalgia -  Abdominal   Peds negative pediatric ROS (+)  Hematology negative hematology ROS (+)   Anesthesia Other Findings Pain score 6/10 everyday at home. On oxy 10mg  BID  Reproductive/Obstetrics negative OB ROS                              Anesthesia Physical Anesthesia Plan  ASA: 3  Anesthesia Plan: General   Post-op Pain Management: Tylenol  PO (pre-op)* and Celebrex  PO (pre-op)*   Induction: Intravenous  PONV Risk Score and Plan: 3 and Ondansetron , Dexamethasone , Midazolam  and Scopolamine  patch - Pre-op  Airway Management Planned: Oral ETT and Video Laryngoscope Planned  Additional Equipment: None  Intra-op Plan:   Post-operative Plan: Extubation in OR  Informed Consent:   Plan Discussed with: CRNA  Anesthesia Plan Comments: (PAT note by Lynwood Hope, PA-C: 43 year old female current smoker with pertinent history including PONV, anxiety, asthma, fibromyalgia, depression, right facial palsy.  She has  had multiple ED evaluations for intractable abdominal pain with benign evaluation.  CT of the abdomen and pelvis 03/10/2024 showed diffuse hepatic steatosis, no other acute findings.  Right upper quadrant ultrasound 03/10/2024 was normal.  Nuclear medicine hepatobiliary imaging 04/03/2024 showed no common duct or cystic duct obstruction, normal gallbladder ejection fraction of 84%. Patient reports she had prior EGD at [facility] without significant findings.  She was ultimately seen by general surgery and her symptoms were felt to be consistent with biliary dyskinesia.   She also had recent ED evaluation at [facility] on 01/15/2024 presenting with diffuse sharp/atypical left chest pain, left neck pain, left arm pain, left upper quadrant pain.  Chest pain resolved spontaneously.  Cardiac workup was benign, EKG without ischemic changes, troponin normal, ACS ruled out.  Symptoms were suspected secondary to fibromyalgia/musculoskeletal pain or possibly pleurisy/bronchitis related pain.  She was prescribed doxycycline  for cough and discharged with PCP follow-up.  Patient states she did follow-up with her PCP at [facility].  She said cardiac event monitor was ordered and she was referred to cardiologist Dr. Jeronimo at [facility].  She states she did not complete the event monitor and did not follow through with cardiology appointment as she was asymptomatic.  She states her only current complaint is right upper quadrant pain.  Preop labs reviewed, WNL.  Reviewed history with anesthesiologist Dr. Keneth.  Advised okay to proceed as planned barring acute status change.  Will be evaluated day of surgery by assigned anesthesiologist.  EKG 01/15/2024 (Care Everywhere): NSR.  Incomplete right bundle branch block.  Rate 90.  EKG 11/02/23: Sinus rhythm. Rate 79. Consider right atrial enlargement. RSR' in V1 or V2, probably normal variant   TTE 06/21/2016: - Procedure narrative: Transthoracic echocardiography. Image   quality was adequate. The study was technically difficult.  - Left ventricle: The cavity size was normal. Systolic function was  normal. The estimated ejection fraction was in the range of 60%  to 65%. Wall motion was normal; there were no regional wall  motion abnormalities. Left ventricular diastolic function  parameters were normal.  - Aortic valve: Transvalvular velocity was within the normal range.  There was no stenosis. There was no regurgitation.  - Mitral valve: Transvalvular velocity was within the normal range.  There was no evidence for stenosis. There was no regurgitation.  - Right ventricle: The cavity size was normal. Wall thickness was  normal. Systolic function was normal.  - Tricuspid valve: There was no regurgitation.  - Pericardium, extracardiac: There was a left pleural effusion.     )         Anesthesia Quick Evaluation

## 2024-05-04 ENCOUNTER — Ambulatory Visit (HOSPITAL_COMMUNITY): Payer: Self-pay | Admitting: Physician Assistant

## 2024-05-04 ENCOUNTER — Ambulatory Visit (HOSPITAL_COMMUNITY)
Admission: RE | Admit: 2024-05-04 | Discharge: 2024-05-04 | Disposition: A | Discharge status: Home / Self Care | Attending: General Surgery | Admitting: General Surgery

## 2024-05-04 ENCOUNTER — Encounter (HOSPITAL_COMMUNITY): Payer: Self-pay | Admitting: General Surgery

## 2024-05-04 ENCOUNTER — Encounter (HOSPITAL_COMMUNITY)
Admission: RE | Disposition: A | Discharge status: Home / Self Care | Payer: Self-pay | Source: Home / Self Care | Attending: General Surgery

## 2024-05-04 ENCOUNTER — Ambulatory Visit (HOSPITAL_BASED_OUTPATIENT_CLINIC_OR_DEPARTMENT_OTHER)

## 2024-05-04 ENCOUNTER — Other Ambulatory Visit: Payer: Self-pay

## 2024-05-04 DIAGNOSIS — F418 Other specified anxiety disorders: Secondary | ICD-10-CM

## 2024-05-04 DIAGNOSIS — F17210 Nicotine dependence, cigarettes, uncomplicated: Secondary | ICD-10-CM

## 2024-05-04 DIAGNOSIS — K811 Chronic cholecystitis: Secondary | ICD-10-CM

## 2024-05-04 DIAGNOSIS — K828 Other specified diseases of gallbladder: Secondary | ICD-10-CM | POA: Diagnosis present

## 2024-05-04 DIAGNOSIS — M797 Fibromyalgia: Secondary | ICD-10-CM | POA: Insufficient documentation

## 2024-05-04 DIAGNOSIS — Z79891 Long term (current) use of opiate analgesic: Secondary | ICD-10-CM | POA: Insufficient documentation

## 2024-05-04 DIAGNOSIS — J45909 Unspecified asthma, uncomplicated: Secondary | ICD-10-CM

## 2024-05-04 HISTORY — PX: CHOLECYSTECTOMY: SHX55

## 2024-05-04 SURGERY — LAPAROSCOPIC CHOLECYSTECTOMY
Anesthesia: General

## 2024-05-04 MED ORDER — PHENYLEPHRINE HCL-NACL 20-0.9 MG/250ML-% IV SOLN
INTRAVENOUS | Status: DC | PRN
  Administered 2024-05-04: 20 ug/min via INTRAVENOUS

## 2024-05-04 MED ORDER — 0.9 % SODIUM CHLORIDE (POUR BTL) OPTIME
TOPICAL | Status: DC | PRN
  Administered 2024-05-04: 1000 mL

## 2024-05-04 MED ORDER — SUGAMMADEX SODIUM 200 MG/2ML IV SOLN
INTRAVENOUS | Status: DC | PRN
Start: 2024-05-04 — End: 2024-05-04
  Administered 2024-05-04: 200 mg via INTRAVENOUS

## 2024-05-04 MED ORDER — CHLORHEXIDINE GLUCONATE CLOTH 2 % EX PADS: 6.0000 | MEDICATED_PAD | Freq: Once | CUTANEOUS | Status: DC

## 2024-05-04 MED ORDER — PHENYLEPHRINE HCL (PRESSORS) 10 MG/ML IV SOLN
INTRAVENOUS | Status: AC
  Filled 2024-05-04: qty 1

## 2024-05-04 MED ORDER — DEXAMETHASONE SODIUM PHOSPHATE 10 MG/ML IJ SOLN
INTRAMUSCULAR | Status: DC | PRN
  Administered 2024-05-04: 5 mg via INTRAVENOUS

## 2024-05-04 MED ORDER — ACETAMINOPHEN 500 MG PO TABS
500.0000 mg | ORAL_TABLET | Freq: Once | ORAL | Status: AC
  Administered 2024-05-04: 500 mg via ORAL
  Filled 2024-05-04: qty 1

## 2024-05-04 MED ORDER — FENTANYL CITRATE (PF) 250 MCG/5ML IJ SOLN
INTRAMUSCULAR | Status: DC | PRN
  Administered 2024-05-04: 150 ug via INTRAVENOUS
  Administered 2024-05-04 (×2): 50 ug via INTRAVENOUS

## 2024-05-04 MED ORDER — ONDANSETRON HCL 4 MG/2ML IJ SOLN
INTRAMUSCULAR | Status: AC
  Filled 2024-05-04: qty 2

## 2024-05-04 MED ORDER — ORAL CARE MOUTH RINSE: 15.0000 mL | Freq: Once | OROMUCOSAL | Status: AC

## 2024-05-04 MED ORDER — ACETAMINOPHEN 500 MG PO TABS: 1000.0000 mg | ORAL_TABLET | ORAL | Status: DC

## 2024-05-04 MED ORDER — FENTANYL CITRATE (PF) 250 MCG/5ML IJ SOLN
INTRAMUSCULAR | Status: AC
  Filled 2024-05-04: qty 5

## 2024-05-04 MED ORDER — ONDANSETRON HCL 4 MG/2ML IJ SOLN
INTRAMUSCULAR | Status: DC | PRN
  Administered 2024-05-04 (×2): 4 mg via INTRAVENOUS

## 2024-05-04 MED ORDER — BUPIVACAINE-EPINEPHRINE (PF) 0.25% -1:200000 IJ SOLN
INTRAMUSCULAR | Status: AC
  Filled 2024-05-04: qty 30

## 2024-05-04 MED ORDER — BUPIVACAINE-EPINEPHRINE 0.25% -1:200000 IJ SOLN
INTRAMUSCULAR | Status: DC | PRN
  Administered 2024-05-04: 30 mL

## 2024-05-04 MED ORDER — DROPERIDOL 2.5 MG/ML IJ SOLN
INTRAMUSCULAR | Status: AC
  Filled 2024-05-04: qty 2

## 2024-05-04 MED ORDER — PHENYLEPHRINE 80 MCG/ML (10ML) SYRINGE FOR IV PUSH (FOR BLOOD PRESSURE SUPPORT)
PREFILLED_SYRINGE | INTRAVENOUS | Status: AC
  Filled 2024-05-04: qty 10

## 2024-05-04 MED ORDER — PROPOFOL 10 MG/ML IV BOLUS
INTRAVENOUS | Status: DC | PRN
  Administered 2024-05-04: 10 mg via INTRAVENOUS
  Administered 2024-05-04: 80 mg via INTRAVENOUS

## 2024-05-04 MED ORDER — DROPERIDOL 2.5 MG/ML IJ SOLN
0.6250 mg | Freq: Once | INTRAMUSCULAR | Status: AC | PRN
  Administered 2024-05-04: 0.625 mg via INTRAVENOUS

## 2024-05-04 MED ORDER — ALBUMIN HUMAN 5 % IV SOLN: INTRAVENOUS | Status: DC | PRN

## 2024-05-04 MED ORDER — SCOPOLAMINE 1 MG/3DAYS TD PT72
1.0000 | MEDICATED_PATCH | TRANSDERMAL | Status: DC
  Administered 2024-05-04: 1.5 mg via TRANSDERMAL
  Filled 2024-05-04: qty 1

## 2024-05-04 MED ORDER — LACTATED RINGERS IV SOLN: INTRAVENOUS | Status: DC

## 2024-05-04 MED ORDER — CHLORHEXIDINE GLUCONATE 0.12 % MT SOLN
15.0000 mL | Freq: Once | OROMUCOSAL | Status: AC
  Administered 2024-05-04: 15 mL via OROMUCOSAL
  Filled 2024-05-04: qty 15

## 2024-05-04 MED ORDER — HYDROCODONE-ACETAMINOPHEN 5-325 MG PO TABS: 1.0000 | ORAL_TABLET | ORAL | 0 refills | Status: AC

## 2024-05-04 MED ORDER — ROCURONIUM BROMIDE 10 MG/ML (PF) SYRINGE
PREFILLED_SYRINGE | INTRAVENOUS | Status: DC | PRN
  Administered 2024-05-04: 20 mg via INTRAVENOUS
  Administered 2024-05-04: 30 mg via INTRAVENOUS

## 2024-05-04 MED ORDER — INDOCYANINE GREEN 25 MG IV SOLR
1.2500 mg | Freq: Once | INTRAVENOUS | Status: AC
  Administered 2024-05-04: 1.25 mg via INTRAVENOUS

## 2024-05-04 MED ORDER — CEFAZOLIN SODIUM-DEXTROSE 2-4 GM/100ML-% IV SOLN
2.0000 g | INTRAVENOUS | Status: AC
  Administered 2024-05-04: 2 g via INTRAVENOUS
  Filled 2024-05-04: qty 100

## 2024-05-04 MED ORDER — FENTANYL CITRATE (PF) 100 MCG/2ML IJ SOLN
25.0000 ug | INTRAMUSCULAR | Status: DC | PRN
  Administered 2024-05-04: 50 ug via INTRAVENOUS

## 2024-05-04 MED ORDER — GABAPENTIN 300 MG PO CAPS
300.0000 mg | ORAL_CAPSULE | ORAL | Status: AC
  Administered 2024-05-04: 300 mg via ORAL
  Filled 2024-05-04: qty 1

## 2024-05-04 MED ORDER — PHENYLEPHRINE 80 MCG/ML (10ML) SYRINGE FOR IV PUSH (FOR BLOOD PRESSURE SUPPORT)
PREFILLED_SYRINGE | INTRAVENOUS | Status: DC | PRN
  Administered 2024-05-04: 80 ug via INTRAVENOUS

## 2024-05-04 MED ORDER — KETAMINE HCL 50 MG/5ML IJ SOSY
PREFILLED_SYRINGE | INTRAMUSCULAR | Status: AC
  Filled 2024-05-04: qty 5

## 2024-05-04 MED ORDER — LIDOCAINE 2% (20 MG/ML) 5 ML SYRINGE
INTRAMUSCULAR | Status: AC
  Filled 2024-05-04: qty 5

## 2024-05-04 MED ORDER — LIDOCAINE 2% (20 MG/ML) 5 ML SYRINGE
INTRAMUSCULAR | Status: DC | PRN
  Administered 2024-05-04: 40 mg via INTRAVENOUS

## 2024-05-04 MED ORDER — ARTIFICIAL TEARS OPHTHALMIC OINT
TOPICAL_OINTMENT | OPHTHALMIC | Status: AC
  Filled 2024-05-04: qty 3.5

## 2024-05-04 MED ORDER — KETAMINE HCL 10 MG/ML IJ SOLN
INTRAMUSCULAR | Status: DC | PRN
  Administered 2024-05-04: 20 mg via INTRAVENOUS

## 2024-05-04 MED ORDER — CELECOXIB 200 MG PO CAPS
200.0000 mg | ORAL_CAPSULE | Freq: Once | ORAL | Status: AC
  Administered 2024-05-04: 200 mg via ORAL
  Filled 2024-05-04: qty 1

## 2024-05-04 MED ORDER — PROPOFOL 10 MG/ML IV BOLUS
INTRAVENOUS | Status: AC
  Filled 2024-05-04: qty 20

## 2024-05-04 MED ORDER — DEXAMETHASONE SODIUM PHOSPHATE 10 MG/ML IJ SOLN
INTRAMUSCULAR | Status: AC
  Filled 2024-05-04: qty 1

## 2024-05-04 MED ORDER — SODIUM CHLORIDE 0.9 % IR SOLN
Status: DC | PRN
  Administered 2024-05-04: 1000 mL

## 2024-05-04 MED ORDER — MIDAZOLAM HCL 2 MG/2ML IJ SOLN
INTRAMUSCULAR | Status: DC | PRN
  Administered 2024-05-04: 2 mg via INTRAVENOUS

## 2024-05-04 MED ORDER — ENOXAPARIN SODIUM 40 MG/0.4ML IJ SOSY
40.0000 mg | PREFILLED_SYRINGE | Freq: Once | INTRAMUSCULAR | Status: AC
  Administered 2024-05-04: 40 mg via SUBCUTANEOUS
  Filled 2024-05-04: qty 0.4

## 2024-05-04 MED ORDER — ROCURONIUM BROMIDE 10 MG/ML (PF) SYRINGE
PREFILLED_SYRINGE | INTRAVENOUS | Status: AC
  Filled 2024-05-04: qty 10

## 2024-05-04 MED ORDER — MIDAZOLAM HCL 2 MG/2ML IJ SOLN
INTRAMUSCULAR | Status: AC
  Filled 2024-05-04: qty 2

## 2024-05-04 MED ORDER — EPHEDRINE 5 MG/ML INJ
INTRAVENOUS | Status: AC
Start: 2024-05-04 — End: 2024-05-04
  Filled 2024-05-04: qty 5

## 2024-05-04 MED ORDER — HEMOSTATIC AGENTS (NO CHARGE) OPTIME
TOPICAL | Status: DC | PRN
  Administered 2024-05-04: 1 via TOPICAL

## 2024-05-04 MED ORDER — FENTANYL CITRATE (PF) 100 MCG/2ML IJ SOLN
INTRAMUSCULAR | Status: AC
  Filled 2024-05-04: qty 2

## 2024-05-04 SURGICAL SUPPLY — 40 items
BLADE CLIPPER SURG (BLADE) IMPLANT
CANISTER SUCTION 3000ML PPV (SUCTIONS) ×1 IMPLANT
CHLORAPREP W/TINT 26 (MISCELLANEOUS) ×1 IMPLANT
CLIP LIGATING HEMO O LOK GREEN (MISCELLANEOUS) ×1 IMPLANT
CNTNR URN SCR LID CUP LEK RST (MISCELLANEOUS) ×1 IMPLANT
COVER SURGICAL LIGHT HANDLE (MISCELLANEOUS) ×1 IMPLANT
DERMABOND ADVANCED .7 DNX12 (GAUZE/BANDAGES/DRESSINGS) ×1 IMPLANT
DRAPE LAPAROTOMY 100X72X124 (DRAPES) IMPLANT
ELECTRODE REM PT RTRN 9FT ADLT (ELECTROSURGICAL) ×1 IMPLANT
GLOVE BIOGEL PI MICRO STRL 6 (GLOVE) ×1 IMPLANT
GLOVE INDICATOR 6.5 STRL GRN (GLOVE) ×1 IMPLANT
GOWN STRL REUS W/ TWL LRG LVL3 (GOWN DISPOSABLE) ×3 IMPLANT
GRASPER SUT TROCAR 14GX15 (MISCELLANEOUS) ×1 IMPLANT
HEMOSTAT SNOW SURGICEL 2X4 (HEMOSTASIS) IMPLANT
IRRIGATION SUCT STRKRFLW 2 WTP (MISCELLANEOUS) ×1 IMPLANT
KIT BASIN OR (CUSTOM PROCEDURE TRAY) ×1 IMPLANT
KIT TURNOVER KIT B (KITS) ×1 IMPLANT
LHOOK LAP DISP 36CM (ELECTROSURGICAL) ×1 IMPLANT
NDL 22X1.5 STRL (OR ONLY) (MISCELLANEOUS) ×1 IMPLANT
NDL INSUFFLATION 14GA 120MM (NEEDLE) ×1 IMPLANT
NEEDLE 22X1.5 STRL (OR ONLY) (MISCELLANEOUS) ×1 IMPLANT
NEEDLE INSUFFLATION 14GA 120MM (NEEDLE) ×1 IMPLANT
NS IRRIG 1000ML POUR BTL (IV SOLUTION) ×1 IMPLANT
PAD ARMBOARD POSITIONER FOAM (MISCELLANEOUS) ×1 IMPLANT
PENCIL BUTTON HOLSTER BLD 10FT (ELECTRODE) ×1 IMPLANT
POUCH LAPAROSCOPIC INSTRUMENT (MISCELLANEOUS) ×1 IMPLANT
POUCH RETRIEVAL ECOSAC 10 (ENDOMECHANICALS) IMPLANT
SCISSORS LAP 5X35 DISP (ENDOMECHANICALS) ×1 IMPLANT
SET TUBE SMOKE EVAC HIGH FLOW (TUBING) ×1 IMPLANT
SLEEVE Z-THREAD 5X100MM (TROCAR) ×2 IMPLANT
SPECIMEN JAR SMALL (MISCELLANEOUS) ×1 IMPLANT
SUT MNCRL AB 4-0 PS2 18 (SUTURE) ×1 IMPLANT
SUT VICRYL 0 UR6 27IN ABS (SUTURE) IMPLANT
SYSTEM BAG RETRIEVAL 10MM (BASKET) ×1 IMPLANT
TOWEL GREEN STERILE FF (TOWEL DISPOSABLE) ×1 IMPLANT
TRAY LAPAROSCOPIC MC (CUSTOM PROCEDURE TRAY) ×1 IMPLANT
TROCAR Z THREAD OPTICAL 12X100 (TROCAR) ×1 IMPLANT
TROCAR Z-THREAD OPTICAL 5X100M (TROCAR) ×1 IMPLANT
WARMER LAPAROSCOPE (MISCELLANEOUS) ×1 IMPLANT
WATER STERILE IRR 1000ML POUR (IV SOLUTION) ×1 IMPLANT

## 2024-05-04 NOTE — Transfer of Care (Signed)
 Immediate Anesthesia Transfer of Care Note  Patient: Pamela Bailey  Procedure(s) Performed: LAPAROSCOPIC CHOLECYSTECTOMY  Patient Location: PACU  Anesthesia Type:General  Level of Consciousness: drowsy  Airway & Oxygen Therapy: Patient Spontanous Breathing and Patient connected to face mask oxygen  Post-op Assessment: Report given to RN and Post -op Vital signs reviewed and stable  Post vital signs: Reviewed and stable  Last Vitals:  Vitals Value Taken Time  BP 149/84 05/04/24 09:15  Temp    Pulse 84 05/04/24 09:18  Resp 15 05/04/24 09:18  SpO2 100 % 05/04/24 09:18  Vitals shown include unfiled device data.  Last Pain:  Vitals:   05/04/24 0646  TempSrc:   PainSc: 5       Patients Stated Pain Goal: 3 (05/04/24 0646)  Complications: No notable events documented.

## 2024-05-04 NOTE — H&P (Signed)
 HPI  Pamela Bailey is an 43 y.o. female who was seen in clinic on 04/06/24 for biliary dyskinesia.  Patient has multiple medical problems including asthma, depression, fibromyalgia, endometriosis, anxiety, and chronic abdominal pain. No acute findings on several CT scans. Patient states she has had an EGD within the last 6 months at [facility] and states there was some irritation found but does not remember details. States she has pain and nausea in RUQ with eating, and she has lost weight due to decreased PO intake due to pain. Patient states she was told to take PPI but has not been taking it.  HIDA scan showed slightly hyperkinetic gallbladder with EF of 84%.   10 point review of systems is negative except as listed above in HPI.  Objective  Past Medical History: Past Medical History:  Diagnosis Date   Abnormal Pap smear    colpo 5/09   Anxiety    Asthma    Cleft hard palate    Cleft lip    Depression    Endometriosis    Facial palsy    Fibromyalgia    Intractable abdominal pain 03/10/2024   Ovarian cyst    PONV (postoperative nausea and vomiting)    Scopalamine patch works well for patient    Past Surgical History: Past Surgical History:  Procedure Laterality Date   BREAST BIOPSY Right 2015   neg   CLEFT PALATE REPAIR  1981   COSMETIC SURGERY     multiple related to cleft lip   EXCISION, ENDOMETRIOSIS, ROBOTIC ASSISTED, LAPAROSCOPIC  1999   PARTIAL HYSTERECTOMY  2015   only uterus removed   SALPINGECTOMY     TYMPANOSTOMY TUBE PLACEMENT     as a child   UPPER GASTROINTESTINAL ENDOSCOPY     VAGINAL DELIVERY  2010   VAGINAL DELIVERY  2013    Family History:  Family History  Problem Relation Age of Onset   Chronic fatigue Mother    Stroke Father    Breast cancer Sister        1/2 sister paternal   Breast cancer Maternal Grandmother    Colon cancer Maternal Grandmother    Hypertension Maternal Grandfather    Anesthesia problems Neg Hx     Esophageal cancer Neg Hx    Stomach cancer Neg Hx     Social History:  reports that she has been smoking cigarettes. She has a 4 pack-year smoking history. She has never used smokeless tobacco. She reports current alcohol use. She reports that she does not use drugs.  Allergies:  Allergies  Allergen Reactions   Azithromycin  Other (See Comments)    Caused renal failure (On 11/19/16, patient states this did NOT??)   Vancomycin  Other (See Comments)    Acute kidney injury    Oxycodone  Nausea And Vomiting    Also irritable    Medications: I have reviewed the patient's current medications.  Labs: Pertinent lab work personally reviewed.  Imaging: Pertinent imaging personally reviewed  HIDA 04/03/24: Patent cystic and common bile ducts, slightly hyperkinetic GB with EF of 84% RUQ US  03/10/24: No cholelithiasis or GB wall thickening, no Murphy's sign noted by sonographer, CBD 2mm. CT AP 03/10/24: New diffuse hepatic steatosis, no discrete hepatic lesions. Normal appearing gallbladder and CBD.   Physical Exam Blood pressure 112/79, pulse 83, temperature 98.3 F (36.8 C), temperature source Oral, resp. rate 18, height 5' 1 (1.549 m), weight 38.2 kg, last menstrual period 06/03/2014, SpO2 97%. General: No acute distress,  well appearing HEENT: PERRL, hearing grossly normal, mucous membranes moist CV: Regular rate and rhythm Pulm: Normal work of breathing on room air Abd: Soft, nondistended, tender to palpation in RUQ Extremities: Warm and well perfused Neuro: A&O x4, no focal neurologic deficits Psych: Appropriate mood and effect     Assessment   Pamela Bailey is an 43 y.o. female with biliary dyskinesia  Plan  - Proceed to OR for laparoscopic cholecystectomy with ICG - We discussed the etiology of patient's pain, we discussed treatment options and recommended surgery. We discussed details of surgery including general anesthesia, laparoscopic approach, identification of cystic duct  and common bile duct. Ligation of cystic duct and cystic artery. Possible need for intraoperative cholangiogram, open procedure, and subtotal cholecystectomy. Possible risks of common bile duct injury, injury to surrounding structures, bile leak, bleeding, infection, diarrhea, retained stone and hernia. The patient showed good understanding and all questions were answered    Orie Silversmith, MD [facility]

## 2024-05-04 NOTE — Anesthesia Procedure Notes (Signed)
 Procedure Name: Intubation Date/Time: 05/04/2024 7:49 AM  Performed by: Hedy Jarred, CRNAPre-anesthesia Checklist: Patient identified, Emergency Drugs available, Suction available, Patient being monitored and Timeout performed Patient Re-evaluated:Patient Re-evaluated prior to induction Oxygen Delivery Method: Circle system utilized Preoxygenation: Pre-oxygenation with 100% oxygen Induction Type: IV induction Ventilation: Mask ventilation without difficulty Laryngoscope Size: Glidescope and 3 Grade View: Grade I Tube size: 6.5 mm Number of attempts: 1 Airway Equipment and Method: Stylet and Video-laryngoscopy Placement Confirmation: ETT inserted through vocal cords under direct vision, breath sounds checked- equal and bilateral and CO2 detector Secured at: 21 cm Tube secured with: Tape Dental Injury: Teeth and Oropharynx as per pre-operative assessment

## 2024-05-04 NOTE — Progress Notes (Signed)
 Wasted 50mcg of fentanyl  with Teacher, early years/pre in stericycle.

## 2024-05-04 NOTE — Op Note (Signed)
 05/04/2024 8:52 AM  PATIENT: Pamela Bailey  43 y.o. female  Patient Care Team: Leron Millman, NP as PCP - General (Nurse Practitioner)  PRE-OPERATIVE DIAGNOSIS: biliary dyskinesia  POST-OPERATIVE DIAGNOSIS: biliary dyskinesia with chronic cholecystitis  PROCEDURE: laparoscopic cholecystectomy with ICG  SURGEON: Orie Silversmith, MD  ASSISTANT: Krystal Spinner, MD  ANESTHESIA: General endotracheal  EBL: 5cc  DRAINS: None  SPECIMEN: Gallbladder  COUNTS: Sponge, needle and instrument counts were reported correct x2 at the conclusion of the operation  DISPOSITION: PACU in satisfactory condition  COMPLICATIONS: None  FINDINGS: Fatty enlarged liver. Gallbladder with chronic cholecystitis. ICG confirmed normal biliary tree anatomy. Critical view achieved prior to clip placement.  DESCRIPTION:  Preoperative indocyanine green  was administered in preoperative holding. The patient was identified & brought into the operating room. She was then positioned supine on the OR table. SCDs were in place and active during the entire case. She then underwent general endotracheal anesthesia. Pressure points were padded. Hair on the abdomen was clipped by the OR team. The abdomen was prepped and draped in the standard sterile fashion. Antibiotics were administered. A surgical timeout was performed and confirmed our plan.  A small incision was made in the LUQ at Palmer's point and a veress needle was inserted. Air was aspirated and subsequent positive drop test. Abdomen then insufflated to . A periumbilical incision was then made and a 5mm trocar optiview using a 30 degree scope was inserted and the abdomen was entered under direct visualization. Inspection confirmed no evidence of trocar or veress site complications. The veress was then removed.    The patient was then positioned in reverse Trendelenburg with slight left side down. A 12 mm supxiphoid trocar was placed under direct visualization and   two additional 5mm trocars were placed along the right subcostal line - one 5mm port in mid subcostal region, another 5mm port in the right flank near the anterior axillary line.  The liver and gallbladder were inspected. The liver was quite large and there were some adhesions from the liver to the anterior abdominal wall that were gently taken down with electrocautery. The gallbladder fundus was grasped and elevated cephalad. An additional grasper was then placed on the infundibulum of the gallbladder and the infundibulum was retracted laterally. Staying high on the gallbladder, the peritoneum on both sides of the gallbladder was opened with hook cautery. Gentle blunt dissection was then employed with a [place]  dissector working down into [employer]. The cystic duct was identified and carefully circumferentially dissected. The cystic artery was also identified and carefully circumferentially dissected. The space between the cystic artery and hepatocystic plate was developed such that a good view of the liver could be seen through a window medial to the cystic artery. The triangle of Calot had been cleared of all fibrofatty tissue. At this point, a critical view of safety was achieved and the only structures visualized was the skeletonized cystic duct laterally, the skeletonized cystic artery and the liver through the window medial to the artery. No posterior cystic artery was noted  Attention turned to infrared fluorescent cholangiography with indocyanine green  which was visualized within the common hepatic duct, common bile duct, cystic duct and small bowel.  The cystic duct and artery were clipped with 2 hemolock clips on the patient side and 1 clip on the specimen side. The cystic duct and artery were then divided. The gallbladder was then freed from its remaining attachments to the liver using electrocautery and placed into an endocatch bag. The  RUQ was gently irrigated with sterile saline.  Hemostasis was then checked, there was some slight oozing from gallbladder fossa and a piece of surgicel snow was placed and hemostasis confirmed. The clips were in good position; the gallbladder fossa was dry. The rest of the abdomen was inspected no injury nor bleeding elsewhere was identified.  The endocatch bag containing the gallbladder was then removed from the subxiphoid port site and passed off as specimen. The subxiphoid port fascia was then closed in a figured of eight fashion with 0 vicryl using a suture passer. The RUQ ports were removed under direct visualization and noted to be hemostatic.SABRA The fascia was palpated and noted to be completely closed. The abdomen was then desufflated and the periumbilical trocar removed. The skin of all incision sites was approximated with 4-0 monocryl subcuticular suture and dermabond applied. The patient  was then awakened from anesthesia, extubated, and transferred to a stretcher for transport to PACU in satisfactory condition.  Instrument, sponge, and needle counts were correct at closure and at the conclusion of the case.   Orie Silversmith, MD [facility] Surgery

## 2024-05-04 NOTE — Anesthesia Postprocedure Evaluation (Signed)
 Anesthesia Post Note  Patient: Pamela Bailey  Procedure(s) Performed: LAPAROSCOPIC CHOLECYSTECTOMY     Patient location during evaluation: PACU Anesthesia Type: General Level of consciousness: awake and alert Pain management: pain level controlled Vital Signs Assessment: post-procedure vital signs reviewed and stable Respiratory status: spontaneous breathing, nonlabored ventilation, respiratory function stable and patient connected to nasal cannula oxygen Cardiovascular status: blood pressure returned to baseline and stable Postop Assessment: no apparent nausea or vomiting Anesthetic complications: no   No notable events documented.  Last Vitals:  Vitals:   05/04/24 0945 05/04/24 1000  BP: 130/71 (!) 152/82  Pulse: 78 68  Resp: 12 14  Temp: (!) 36.1 C   SpO2: 94% 97%    Last Pain:  Vitals:   05/04/24 0945  TempSrc:   PainSc: 3                  Thom JONELLE Peoples

## 2024-05-05 ENCOUNTER — Encounter (HOSPITAL_COMMUNITY): Payer: Self-pay | Admitting: General Surgery

## 2024-05-07 LAB — SURGICAL PATHOLOGY

## 2024-05-18 ENCOUNTER — Encounter: Payer: Self-pay | Admitting: General Surgery
